# Patient Record
Sex: Female | Born: 1935 | Race: White | Hispanic: No | Smoking: Never smoker
Health system: Southern US, Community
[De-identification: ages and names within clinical notes are randomized; demographics above are authoritative.]

## PROBLEM LIST (undated history)

## (undated) DIAGNOSIS — I10 Essential (primary) hypertension: Secondary | ICD-10-CM

## (undated) DIAGNOSIS — F329 Major depressive disorder, single episode, unspecified: Secondary | ICD-10-CM

## (undated) DIAGNOSIS — K922 Gastrointestinal hemorrhage, unspecified: Secondary | ICD-10-CM

## (undated) DIAGNOSIS — G20 Parkinson's disease: Secondary | ICD-10-CM

## (undated) DIAGNOSIS — K219 Gastro-esophageal reflux disease without esophagitis: Secondary | ICD-10-CM

## (undated) DIAGNOSIS — I2699 Other pulmonary embolism without acute cor pulmonale: Secondary | ICD-10-CM

## (undated) DIAGNOSIS — F32A Depression, unspecified: Secondary | ICD-10-CM

## (undated) DIAGNOSIS — M1990 Unspecified osteoarthritis, unspecified site: Secondary | ICD-10-CM

## (undated) DIAGNOSIS — S8263XA Displaced fracture of lateral malleolus of unspecified fibula, initial encounter for closed fracture: Secondary | ICD-10-CM

## (undated) DIAGNOSIS — D649 Anemia, unspecified: Secondary | ICD-10-CM

## (undated) DIAGNOSIS — G20A1 Parkinson's disease without dyskinesia, without mention of fluctuations: Secondary | ICD-10-CM

## (undated) HISTORY — PX: VENA CAVA FILTER PLACEMENT: SUR1032

## (undated) HISTORY — PX: HERNIA REPAIR: SHX51

---

## 2001-08-30 ENCOUNTER — Ambulatory Visit (HOSPITAL_COMMUNITY): Admission: RE | Admit: 2001-08-30 | Discharge: 2001-08-30 | Payer: Self-pay | Admitting: Internal Medicine

## 2001-08-30 ENCOUNTER — Encounter: Payer: Self-pay | Admitting: Internal Medicine

## 2001-11-24 ENCOUNTER — Ambulatory Visit (HOSPITAL_COMMUNITY): Admission: RE | Admit: 2001-11-24 | Discharge: 2001-11-24 | Payer: Self-pay | Admitting: Internal Medicine

## 2001-11-24 ENCOUNTER — Encounter: Payer: Self-pay | Admitting: Internal Medicine

## 2002-02-03 ENCOUNTER — Encounter: Payer: Self-pay | Admitting: Emergency Medicine

## 2002-02-03 ENCOUNTER — Emergency Department (HOSPITAL_COMMUNITY): Admission: EM | Admit: 2002-02-03 | Discharge: 2002-02-03 | Payer: Self-pay | Admitting: Emergency Medicine

## 2002-09-12 ENCOUNTER — Encounter: Payer: Self-pay | Admitting: Internal Medicine

## 2002-09-12 ENCOUNTER — Ambulatory Visit (HOSPITAL_COMMUNITY): Admission: RE | Admit: 2002-09-12 | Discharge: 2002-09-12 | Payer: Self-pay | Admitting: Internal Medicine

## 2002-11-29 ENCOUNTER — Ambulatory Visit (HOSPITAL_COMMUNITY): Admission: RE | Admit: 2002-11-29 | Discharge: 2002-11-29 | Payer: Self-pay | Admitting: Internal Medicine

## 2003-09-20 ENCOUNTER — Inpatient Hospital Stay (HOSPITAL_COMMUNITY): Admission: EM | Admit: 2003-09-20 | Discharge: 2003-09-23 | Payer: Self-pay | Admitting: Emergency Medicine

## 2003-10-22 ENCOUNTER — Ambulatory Visit (HOSPITAL_COMMUNITY): Admission: RE | Admit: 2003-10-22 | Discharge: 2003-10-22 | Payer: Self-pay | Admitting: Internal Medicine

## 2003-11-21 ENCOUNTER — Other Ambulatory Visit: Admission: RE | Admit: 2003-11-21 | Discharge: 2003-11-21 | Payer: Self-pay | Admitting: Dermatology

## 2004-06-23 ENCOUNTER — Ambulatory Visit (HOSPITAL_COMMUNITY): Admission: RE | Admit: 2004-06-23 | Discharge: 2004-06-23 | Payer: Self-pay | Admitting: Internal Medicine

## 2004-06-30 ENCOUNTER — Ambulatory Visit (HOSPITAL_COMMUNITY): Admission: RE | Admit: 2004-06-30 | Discharge: 2004-06-30 | Payer: Self-pay | Admitting: Internal Medicine

## 2004-10-28 ENCOUNTER — Ambulatory Visit (HOSPITAL_COMMUNITY): Admission: RE | Admit: 2004-10-28 | Discharge: 2004-10-28 | Payer: Self-pay | Admitting: Internal Medicine

## 2005-11-10 ENCOUNTER — Ambulatory Visit (HOSPITAL_COMMUNITY): Admission: RE | Admit: 2005-11-10 | Discharge: 2005-11-10 | Payer: Self-pay | Admitting: Internal Medicine

## 2006-11-16 ENCOUNTER — Ambulatory Visit (HOSPITAL_COMMUNITY): Admission: RE | Admit: 2006-11-16 | Discharge: 2006-11-16 | Payer: Self-pay | Admitting: Internal Medicine

## 2009-04-22 ENCOUNTER — Ambulatory Visit (HOSPITAL_COMMUNITY): Admission: RE | Admit: 2009-04-22 | Discharge: 2009-04-22 | Payer: Self-pay | Admitting: Neurology

## 2010-10-14 ENCOUNTER — Emergency Department (HOSPITAL_COMMUNITY)
Admission: EM | Admit: 2010-10-14 | Discharge: 2010-10-14 | Payer: Self-pay | Source: Home / Self Care | Admitting: Emergency Medicine

## 2010-11-12 ENCOUNTER — Emergency Department (HOSPITAL_COMMUNITY)
Admission: EM | Admit: 2010-11-12 | Discharge: 2010-11-12 | Payer: Self-pay | Source: Home / Self Care | Admitting: Emergency Medicine

## 2010-11-13 ENCOUNTER — Observation Stay (HOSPITAL_COMMUNITY)
Admission: EM | Admit: 2010-11-13 | Discharge: 2010-11-18 | Payer: Self-pay | Source: Home / Self Care | Attending: Internal Medicine | Admitting: Internal Medicine

## 2010-11-17 LAB — DIFFERENTIAL
Basophils Absolute: 0 10*3/uL (ref 0.0–0.1)
Basophils Relative: 1 % (ref 0–1)
Eosinophils Absolute: 0.1 10*3/uL (ref 0.0–0.7)
Eosinophils Relative: 1 % (ref 0–5)
Lymphocytes Relative: 31 % (ref 12–46)
Lymphs Abs: 1.7 10*3/uL (ref 0.7–4.0)
Monocytes Absolute: 0.8 10*3/uL (ref 0.1–1.0)
Monocytes Relative: 15 % — ABNORMAL HIGH (ref 3–12)
Neutro Abs: 2.8 10*3/uL (ref 1.7–7.7)
Neutrophils Relative %: 52 % (ref 43–77)

## 2010-11-17 LAB — COMPREHENSIVE METABOLIC PANEL
ALT: <8 U/L (ref 0–35)
AST: 15 U/L (ref 0–37)
Albumin: 3.5 g/dL (ref 3.5–5.2)
Alkaline Phosphatase: 53 U/L (ref 39–117)
BUN: 22 mg/dL (ref 6–23)
CO2: 28 mEq/L (ref 19–32)
Calcium: 9.6 mg/dL (ref 8.4–10.5)
Chloride: 96 mEq/L (ref 96–112)
Creatinine, Ser: 1.36 mg/dL — ABNORMAL HIGH (ref 0.4–1.2)
GFR calc Af Amer: 45 mL/min — ABNORMAL LOW (ref >60)
GFR calc non Af Amer: 37 mL/min — ABNORMAL LOW (ref >60)
Glucose, Bld: 92 mg/dL (ref 70–99)
Potassium: 4.5 mEq/L (ref 3.5–5.1)
Sodium: 134 mEq/L — ABNORMAL LOW (ref 135–145)
Total Bilirubin: 0.6 mg/dL (ref 0.3–1.2)
Total Protein: 6.5 g/dL (ref 6.0–8.3)

## 2010-11-17 LAB — CBC
HCT: 32.9 % — ABNORMAL LOW (ref 36.0–46.0)
Hemoglobin: 11.6 g/dL — ABNORMAL LOW (ref 12.0–15.0)
MCH: 33 pg (ref 26.0–34.0)
MCHC: 35.3 g/dL (ref 30.0–36.0)
MCV: 93.5 fL (ref 78.0–100.0)
Platelets: 290 10*3/uL (ref 150–400)
RBC: 3.52 MIL/uL — ABNORMAL LOW (ref 3.87–5.11)
RDW: 12.8 % (ref 11.5–15.5)
WBC: 5.3 10*3/uL (ref 4.0–10.5)

## 2010-11-17 NOTE — H&P (Signed)
[name], [name]                 ACCOUNT NO.:  [id number]  MEDICAL RECORD NO.:  [id number]          PATIENT TYPE:  INP  LOCATION:  A341                          FACILITY:  [facility]  PHYSICIAN:  Kingsley Callander. [name], MD       DATE OF BIRTH:  1923-01-08  DATE OF ADMISSION:  11/13/2010 DATE OF DISCHARGE:  LH                             HISTORY & PHYSICAL   CHIEF COMPLAINT:  Right hip pain.  HISTORY OF PRESENT ILLNESS:  This patient is an 77 year old white female who presented to the emergency room with right hip pain.  Her pain has increased in severity over the last 3 days.  She denies any fall or injury.  She was in the emergency room on the night prior to admission, at which time x-rays revealed no fracture of the hip or pelvis.  After returning home, she was not able to walk.  The family could not manage in her current state.  She was reevaluated at the emergency room where an MRI of the hip was obtained.  This also revealed no fracture. However, there was evidence of lumbar spondylosis.  She had described burning pain in her right leg.  She had not experienced any unilateral weakness nor had there had been any real change in her bowel or bladder control which are not optimal at baseline.  She was hospitalized for additional evaluation and treatment of her intractable back pain and now inability to walk.  PAST MEDICAL HISTORY: 1. Parkinson disease. 2. Osteoarthritis. 3. Hypertension. 4. Diabetes. 5. Anxiety. 6. GERD. 7. Osteoporosis. 8. Hyperlipidemia. 9. Hypercalcemia. 10.Hysterectomy. 11.Foot surgery. 12.Breast lumpectomy. 13.Cataract surgery.  MEDICATIONS: 1. Paxil 10 mg daily. 2. Remeron 15 mg at bedtime. 3. Sinemet 25/100 two tablets t.i.d. 4. Lisinopril/HCT 40/25 mg daily. 5. Tylenol p.r.n. 6. Tramadol p.r.n. 7. Prilosec 20 mg daily.  ALLERGIES:  None.  SOCIAL HISTORY:  She lives at home and does not use tobacco, alcohol, or recreational substances.  She is  accompanied by her daughter, [name].  FAMILY HISTORY:  Her father had a stroke.  Two brothers have had coronary artery disease.  REVIEW OF SYSTEMS:  Noncontributory.  PHYSICAL EXAMINATION:  GENERAL:  Vital signs are currently normal.  She is uncomfortable with minimal movement. HEENT:  Eyes and nose are unremarkable.  Oropharynx reveals missing teeth.  Mucous membranes are moist. NECK:  Supple with no JVD or thyromegaly. LUNGS:  Clear. HEART:  Regular with no murmurs. ABDOMEN:  Nontender with no hepatosplenomegaly. EXTREMITIES:  No cyanosis, clubbing, or edema.  She has fairly well- maintained range of motion in the hips with no palpable hip tenderness. NEUROLOGIC:  Straight leg raising is positive on the right.  There is no focal weakness.  She has diminished light touch sensation in a right lateral leg.  She has stable rigidity.  Cogwheeling is noted.  Her gait was not able to be tested.  No focal weakness.  Back reveals kyphosis and scoliosis.  LABORATORY DATA:  Reveals an unremarkable CBC and a mildly elevated creatinine of 1.3.  X-rays revealed no evidence of hip fracture.  IMPRESSION/PLAN: 1. Right  hip pain.  This appears to likely be related to lumbar     spondylosis and referred pain.  She will be evaluated with an MRI     of the lumbar spine based on the appearance of lumbar spondylosis     on the MRI of her hip.  She will be treated with oxycodone as     needed.  Physical therapy will be consulted as needed. 2. Parkinson disease.  Continue Sinemet. 3. Osteoarthritis. 4. Hypertension.  Continue lisinopril/HCT. 5. History of diabetes, now controlled with diet. 6. Anxiety and depression.  Continue Paxil, Remeron and p.r.n. Xanax. 7. Gastroesophageal reflux disease.  Continue Prilosec. 8. Renal insufficiency, stable. 9. History of osteoporosis. 10.History of hyperlipidemia.     Kingsley Callander. [name], MD     ROF/MEDQ  D:  11/14/2010  T:  11/15/2010  Job:   [id number]  Electronically Signed by Carylon Perches MD on 11/17/2010 08:10:22 AM

## 2010-11-21 ENCOUNTER — Inpatient Hospital Stay (HOSPITAL_COMMUNITY)
Admission: EM | Admit: 2010-11-21 | Discharge: 2010-11-24 | Disposition: A | Discharge status: Home / Self Care | Payer: Self-pay | Source: Home / Self Care | Attending: Internal Medicine | Admitting: Internal Medicine

## 2010-11-21 LAB — BASIC METABOLIC PANEL
BUN: 40 mg/dL — ABNORMAL HIGH (ref 6–23)
CO2: 27 mEq/L (ref 19–32)
Calcium: 9.9 mg/dL (ref 8.4–10.5)
Chloride: 93 mEq/L — ABNORMAL LOW (ref 96–112)
Creatinine, Ser: 1.4 mg/dL — ABNORMAL HIGH (ref 0.4–1.2)
GFR calc Af Amer: 43 mL/min — ABNORMAL LOW (ref >60)
GFR calc non Af Amer: 36 mL/min — ABNORMAL LOW (ref >60)
Glucose, Bld: 163 mg/dL — ABNORMAL HIGH (ref 70–99)
Potassium: 4.4 mEq/L (ref 3.5–5.1)
Sodium: 131 mEq/L — ABNORMAL LOW (ref 135–145)

## 2010-11-21 LAB — DIFFERENTIAL
Basophils Absolute: 0 10*3/uL (ref 0.0–0.1)
Basophils Relative: 0 % (ref 0–1)
Eosinophils Absolute: 0 10*3/uL (ref 0.0–0.7)
Eosinophils Relative: 0 % (ref 0–5)
Lymphocytes Relative: 3 % — ABNORMAL LOW (ref 12–46)
Lymphs Abs: 0.4 10*3/uL — ABNORMAL LOW (ref 0.7–4.0)
Monocytes Absolute: 0.8 10*3/uL (ref 0.1–1.0)
Monocytes Relative: 7 % (ref 3–12)
Neutro Abs: 9.4 10*3/uL — ABNORMAL HIGH (ref 1.7–7.7)
Neutrophils Relative %: 89 % — ABNORMAL HIGH (ref 43–77)

## 2010-11-21 LAB — CBC
HCT: 36.4 % (ref 36.0–46.0)
Hemoglobin: 13.3 g/dL (ref 12.0–15.0)
MCH: 33.3 pg (ref 26.0–34.0)
MCHC: 36.5 g/dL — ABNORMAL HIGH (ref 30.0–36.0)
MCV: 91.2 fL (ref 78.0–100.0)
Platelets: 261 10*3/uL (ref 150–400)
RBC: 3.99 MIL/uL (ref 3.87–5.11)
RDW: 12.5 % (ref 11.5–15.5)
WBC: 10.6 10*3/uL — ABNORMAL HIGH (ref 4.0–10.5)

## 2010-11-21 NOTE — Progress Notes (Signed)
  NAMECEIRA, HOESCHEN                 ACCOUNT NO.:  [id number]  MEDICAL RECORD NO.:  [id number]          PATIENT TYPE:  OBV  LOCATION:  A341                          FACILITY:  [facility]  PHYSICIAN:  Edward L. Juanetta Gosling, M.D.DATE OF BIRTH:  10-30-1922  DATE OF PROCEDURE: DATE OF DISCHARGE:  11/18/2010                                PROGRESS NOTE   The patient of Dr. [name].  Ms. Landgrebe is doing okay and has a bed offer now at one of the skilled care facilities in town.  She has no new complaints.  Her exam shows temperature is 97.4, pulse 86, respirations 18, blood pressure 119/72, O2 sats 97% on room air.  Dr. [name] has already done the discharge summary and was done last night.  I do not think there are any changes to that.  Assessment then is she has a right lumbar radiculopathy, Parkinson disease, osteoarthritis, hypertension, diabetes, gastroesophageal reflux disease, osteoporosis and she is going to be discharged to the nursing home.    Edward L. Juanetta Gosling, M.D.     ELH/MEDQ  D:  11/18/2010  T:  11/19/2010  Job:  [id number]  Electronically Signed by Kari Baars M.D. on 11/21/2010 10:52:52 AM

## 2010-11-22 LAB — URINALYSIS, ROUTINE W REFLEX MICROSCOPIC
Bilirubin Urine: NEGATIVE
Hgb urine dipstick: NEGATIVE
Nitrite: NEGATIVE
Protein, ur: NEGATIVE mg/dL
Specific Gravity, Urine: 1.02 (ref 1.005–1.030)
Urine Glucose, Fasting: NEGATIVE mg/dL
Urobilinogen, UA: 0.2 mg/dL (ref 0.0–1.0)
pH: 5 (ref 5.0–8.0)

## 2010-11-22 LAB — MRSA PCR SCREENING: MRSA by PCR: NEGATIVE

## 2010-11-23 LAB — BASIC METABOLIC PANEL
BUN: 22 mg/dL (ref 6–23)
CO2: 27 mEq/L (ref 19–32)
Calcium: 8.8 mg/dL (ref 8.4–10.5)
Chloride: 101 mEq/L (ref 96–112)
Creatinine, Ser: 1.08 mg/dL (ref 0.4–1.2)
GFR calc Af Amer: 58 mL/min — ABNORMAL LOW (ref >60)
GFR calc non Af Amer: 48 mL/min — ABNORMAL LOW (ref >60)
Glucose, Bld: 95 mg/dL (ref 70–99)
Potassium: 4.8 mEq/L (ref 3.5–5.1)
Sodium: 133 mEq/L — ABNORMAL LOW (ref 135–145)

## 2010-11-23 LAB — CBC
HCT: 32.5 % — ABNORMAL LOW (ref 36.0–46.0)
Hemoglobin: 11.4 g/dL — ABNORMAL LOW (ref 12.0–15.0)
MCH: 33 pg (ref 26.0–34.0)
MCHC: 35.1 g/dL (ref 30.0–36.0)
MCV: 94.2 fL (ref 78.0–100.0)
Platelets: 240 10*3/uL (ref 150–400)
RBC: 3.45 MIL/uL — ABNORMAL LOW (ref 3.87–5.11)
RDW: 13 % (ref 11.5–15.5)
WBC: 7.5 10*3/uL (ref 4.0–10.5)

## 2010-11-23 LAB — DIFFERENTIAL
Basophils Absolute: 0 10*3/uL (ref 0.0–0.1)
Basophils Relative: 0 % (ref 0–1)
Eosinophils Absolute: 0 10*3/uL (ref 0.0–0.7)
Eosinophils Relative: 0 % (ref 0–5)
Lymphocytes Relative: 16 % (ref 12–46)
Lymphs Abs: 1.2 10*3/uL (ref 0.7–4.0)
Monocytes Absolute: 0.9 10*3/uL (ref 0.1–1.0)
Monocytes Relative: 12 % (ref 3–12)
Neutro Abs: 5.4 10*3/uL (ref 1.7–7.7)
Neutrophils Relative %: 72 % (ref 43–77)

## 2010-11-24 NOTE — Discharge Summary (Signed)
  [name], [name]                 ACCOUNT NO.:  [id number]  MEDICAL RECORD NO.:  [id number]          PATIENT TYPE:  OBV  LOCATION:  A341                          FACILITY:  [facility]  PHYSICIAN:  Kingsley Callander. [name], MD       DATE OF BIRTH:  1923/02/14  DATE OF ADMISSION:  11/13/2010 DATE OF DISCHARGE:  LH                              DISCHARGE SUMMARY   DISCHARGE DIAGNOSES: 1. Right lumbar radiculopathy. 2. Parkinson's disease. 3. Osteoarthritis. 4. Hypertension. 5. History of diabetes. 6. Anxiety. 7. Gastroesophageal reflux disease. 8. Osteoporosis. 9. Hyperlipidemia. 10.History of hypercalcemia.  DISCHARGE MEDICATIONS: 1. Prednisone 20 mg daily for 2 weeks, 10 mg daily for 2 weeks, then 5     mg daily for 2 weeks. 2. Percocet 5/325 mg q.4 p.r.n. 3. Paxil 10 mg daily. 4. Remeron 15 mg at bedtime. 5. Sinemet 25/100, 2 tablets t.i.d. 6. Lisinopril 40 mg daily. 7. HCTZ 25 mg daily. 8. Tylenol 650 mg p.o. q.4 p.r.n. 9. Prilosec 20 mg daily.  HOSPITAL COURSE:  This patient is an 77 year old female with Parkinson's disease who presented with severe pain in her right hip.  She had been in the emergency room 1 day prior to admission with right hip pain.  X- rays at that time revealed no fracture.  She came back to the emergency room at which time an MRI was obtained which again revealed no fracture. She was hospitalized for pain control and further evaluation.  She underwent an MRI of the lumbar spine which revealed severe facet arthritis at L5-S1 on the right along with a synovial cyst compressing the right side of the thecal sac deviating the right S1 nerve root..  Her case was discussed with her neurologist, Dr. Terrace Arabia and with the neurosurgeon on call Dr. Phoebe Perch.  The decision was made to treat conservatively with addition of corticosteroids.  Arrangements will be made for an outpatient neurosurgery evaluation for consideration of epidural steroids.  She will require physical  therapy and probably a skilled nursing facility placement for now.  Her family is really not able to care for her since she cannot stand and walk independently at this point.  She has been stable from a Parkinson standpoint.  She has been continued on Sinemet.  Her hypertension has been well controlled on lisinopril and HCTZ.  She does have renal insufficiency with a BUN and creatinine of 22 and 1.36. Her electrolytes were normal.  Her condition at discharge is improved.     Kingsley Callander. [name], MD     ROF/MEDQ  D:  11/17/2010  T:  11/17/2010  Job:  [id number]  Electronically Signed by Carylon Perches MD on 11/24/2010 07:48:54 AM

## 2010-11-27 NOTE — H&P (Signed)
[name], [name]                 ACCOUNT NO.:  [id number]  MEDICAL RECORD NO.:  [id number]           PATIENT TYPE:  LOCATION:                                 FACILITY:  PHYSICIAN:  Keath Matera L. Juanetta Gosling, M.D.DATE OF BIRTH:  1923-04-23  DATE OF ADMISSION: DATE OF DISCHARGE:  LH                             HISTORY & PHYSICAL   The patient of Dr. [name].  HISTORY OF PRESENT ILLNESS:  [name] is an 77 year old who was discharged from the hospital earlier this week.  She has a history of a right lumbar radiculopathy, Parkinson's disease, osteoarthritis, hypertension, diabetes, anxiety, GERD, osteoporosis, hyperlipidemia, and hypercalcemia, and she was transferred to a skilled care facility. After she had been at the skilled care facility.  However, she developed GI problems, and she has been vomiting up small amounts of clear liquid for the last 12 hours or so.  She received Phenergan in the nursing home but it did not help.  She has been nauseated but has not had any diarrhea.  She has not had any fever.  She has had some cough.  It is not clear that this was related to anything except perhaps to meals. She was sent to the emergency room, and it was felt that she should be admitted to try to get control of her symptoms, and she was not controlled on medications at the nursing home.  PAST MEDICAL HISTORY:  As mentioned is positive for Parkinson disease, osteoarthritis, hypertension, diabetes, anxiety, GERD, osteoporosis, hyperlipidemia, hypercalcemia, status post hysterectomy, status post foot surgery, status post a lump removal of her breast and cataract surgery.  MEDICATIONS:  Prednisone 20 mg daily for 2 weeks then 10 mg daily for 2 weeks, then 5 mg daily for 2 weeks, Percocet 5/325 every 4 hours p.r.n. pain, Paxil 10 mg daily, Remeron 50 mg at bedtime, Sinemet 25/100 two tablets t.i.d., lisinopril 40 mg daily, hydrochlorothiazide 25 mg daily, Tylenol 650 mg p.o. q.4 h p.r.n.  and Prilosec 20 mg daily.  SOCIAL HISTORY:  She lived at home until the admission to the skilled care facility which is for rehabilitation.  She does not use tobacco, alcohol, or illicit drugs.  FAMILY HISTORY:  It shows that her father died of a stroke.  She does have 2 brothers who had heart disease.  It was cause of their death.  REVIEW OF SYSTEMS:  As mentioned, she has not had any nausea.  She has not had any diarrhea.  She has had a little bit of abdominal cramping.  PHYSICAL EXAMINATION:  GENERAL:  A well-developed, well-nourished elderly female who appears to be in mild distress because of nausea. VITAL SIGNS:  Temperature is 97.7, pulse 117, respirations 18, blood pressure 116/71, and O2 sat 91% on room air.  Her pulse later came down to 98, height 59 inches, weight 61.3 kg. HEENT:  Her pupils are reactive.  Nose and throat are clear.  Her mucous membranes are somewhat dry.  Tympanic membranes are intact. NECK:  Supple without masses, bruits, or JVD. CHEST:  Clear without wheezes, rales, or rhonchi. HEART:  Regular.  She initially  had a tachycardia which is better with IV fluid replacement.  She does not have a murmur, gallop, or rub. ABDOMEN:  Soft.  No masses are felt.  Bowel sounds present and active perhaps slightly hyperactive.  She has very minimal diffuse tenderness of the abdomen. EXTREMITIES:  No clubbing, cyanosis, or edema. CENTRAL NERVOUS SYSTEM:  Evidence of Parkinson disease, otherwise intact.  LABORATORY WORK:  Her white blood count is 10,600, hemoglobin 13.3, platelets 261, BMET shows a sodium that is slightly low at 131, glucose of 163, BUN of 40, creatinine 1.4, both are slightly higher than previously.  Her urinalysis is negative, and she had abdominal acute abdominal series that shows a large hiatal hernia but no small bowel obstruction.  No abnormalities on chest x-rays except for hyperexpansion.  ASSESSMENT:  She has possibly a gastroenteritis.   She has some sort of nausea and vomiting.  She has not developed any fever, however, and she has not had any diarrhea.  She is at a skilled care facility, so there are some exposures.  She has multiple other medical problems as listed above.  My plan then is to continue with her medications except for her hydrochlorothiazide, start her on IV fluids, anti-emetics.  Hopefully, this will be a short-lived self-limited problem, and she will be able to return to her skilled care facility early next week.     Fabio Wah L. Juanetta Gosling, M.D.     ELH/MEDQ  D:  11/22/2010  T:  11/23/2010  Job:  [id number]  Electronically Signed by Kari Baars M.D. on 11/27/2010 08:20:53 AM

## 2010-12-01 NOTE — Discharge Summary (Signed)
  Amy, [name]                 ACCOUNT NO.:  [id number]  MEDICAL RECORD NO.:  [id number]          PATIENT TYPE:  INP  LOCATION:  A307                          FACILITY:  [facility]  PHYSICIAN:  Kingsley Callander. [name], MD       DATE OF BIRTH:  [birth year]  DATE OF ADMISSION:  11/21/2010 DATE OF DISCHARGE:  LH                              DISCHARGE SUMMARY   DISCHARGE DIAGNOSES: 1. Gastroenteritis. 2. Dehydration. 3. Right lumbar radiculopathy. 4. Parkinson's disease. 5. Osteoarthritis. 6. Hypertension. 7. History of diabetes. 8. Anxiety. 9. Gastroesophageal reflux disease. 10.Osteoporosis. 11.Hyperlipidemia. 12.History of hypercalcemia.  DISCHARGE MEDICATIONS: 1. Prednisone 20 mg daily for 1 more week, then 10 mg daily for 2     weeks, then 5 mg daily for 2 weeks. 2. Percocet 5/325 q.4 h. p.r.n. 3. Paxil 10 mg daily. 4. Remeron 15 mg at bedtime. 5. Sinemet 25/100, 2 tablets t.i.d. 6. Lisinopril 40 mg daily. 7. HCTZ 25 mg daily. 8. Tylenol 650 mg p.o. q.4 p.r.n. 9. Prilosec 20 mg daily.  HOSPITAL COURSE:  This patient is an 77 year old white female with Parkinson's disease who was admitted to the hospital with a probable gastroenteritis.  She had been hospitalized last week and had been discharged to [facility].  She developed nausea and vomiting and was readmitted and treated with IV fluids and antiemetics.  She was dehydrated with a BUN of 40 and creatinine of 1.4.  Serum sodium was mildly low at 131.  Her white count was 10.6.  She was allowed to take clear liquids after vomiting resolved.  She tolerated this well and her diet was advanced without complications. She was significantly improved by the 31st and was stable for discharge back to a skilled nursing facility.  She continues to experience right back and hip pain felt related to lumbar radiculopathy. Arrangements are being made for consultation with the physiatrist. She will be continued on a prednisone taper along with  oxycodone on a p.r.n. basis.  She will be seen in followup at her nursing facility.     Kingsley Callander. [name], MD     ROF/MEDQ  D:  11/24/2010  T:  11/24/2010  Job:  [id number]  Electronically Signed by Carylon Perches MD on 12/01/2010 07:53:56 AM

## 2011-01-04 LAB — URINALYSIS, ROUTINE W REFLEX MICROSCOPIC
Bilirubin Urine: NEGATIVE
Glucose, UA: NEGATIVE mg/dL
Hgb urine dipstick: NEGATIVE
Ketones, ur: NEGATIVE mg/dL
Nitrite: NEGATIVE
Protein, ur: NEGATIVE mg/dL
Specific Gravity, Urine: 1.005 (ref 1.005–1.030)
Urobilinogen, UA: 0.2 mg/dL (ref 0.0–1.0)
pH: 6 (ref 5.0–8.0)

## 2011-01-04 LAB — URINE CULTURE
Colony Count: NO GROWTH
Culture  Setup Time: 201112211126
Culture: NO GROWTH

## 2011-01-04 LAB — CBC
HCT: 33.2 % — ABNORMAL LOW (ref 36.0–46.0)
Hemoglobin: 11.8 g/dL — ABNORMAL LOW (ref 12.0–15.0)
MCH: 32.6 pg (ref 26.0–34.0)
MCHC: 35.5 g/dL (ref 30.0–36.0)
MCV: 91.7 fL (ref 78.0–100.0)
Platelets: 249 K/uL (ref 150–400)
RBC: 3.62 MIL/uL — ABNORMAL LOW (ref 3.87–5.11)
RDW: 12.6 % (ref 11.5–15.5)
WBC: 6.2 K/uL (ref 4.0–10.5)

## 2011-01-04 LAB — DIFFERENTIAL
Basophils Absolute: 0 10*3/uL (ref 0.0–0.1)
Basophils Relative: 1 % (ref 0–1)
Eosinophils Absolute: 0.1 10*3/uL (ref 0.0–0.7)
Eosinophils Relative: 2 % (ref 0–5)
Lymphocytes Relative: 17 % (ref 12–46)

## 2011-01-05 ENCOUNTER — Ambulatory Visit: Payer: Self-pay | Admitting: Gastroenterology

## 2011-01-07 ENCOUNTER — Encounter: Payer: Self-pay | Admitting: Gastroenterology

## 2011-01-07 ENCOUNTER — Ambulatory Visit (INDEPENDENT_AMBULATORY_CARE_PROVIDER_SITE_OTHER): Payer: Self-pay | Admitting: Gastroenterology

## 2011-01-07 ENCOUNTER — Encounter: Payer: Self-pay | Admitting: Internal Medicine

## 2011-01-07 DIAGNOSIS — K219 Gastro-esophageal reflux disease without esophagitis: Secondary | ICD-10-CM

## 2011-01-07 DIAGNOSIS — R1319 Other dysphagia: Secondary | ICD-10-CM | POA: Insufficient documentation

## 2011-01-07 DIAGNOSIS — I959 Hypotension, unspecified: Secondary | ICD-10-CM

## 2011-01-12 NOTE — Letter (Signed)
Summary: EGD/ED ORDER  EGD/ED ORDER   Imported By: Ave Filter 01/07/2011 15:00:17  _____________________________________________________________________  External Attachment:    Type:   Image     Comment:   External Document

## 2011-01-13 ENCOUNTER — Emergency Department (HOSPITAL_COMMUNITY): Payer: Medicare Other

## 2011-01-13 ENCOUNTER — Inpatient Hospital Stay (HOSPITAL_COMMUNITY)
Admission: RE | Admit: 2011-01-13 | Discharge: 2011-01-18 | DRG: 682 | Disposition: A | Discharge status: Skilled Nursing Facility | Payer: Medicare Other | Source: Ambulatory Visit | Attending: Internal Medicine | Admitting: Internal Medicine

## 2011-01-13 DIAGNOSIS — M1990 Unspecified osteoarthritis, unspecified site: Secondary | ICD-10-CM | POA: Diagnosis present

## 2011-01-13 DIAGNOSIS — G20 Parkinson's disease: Secondary | ICD-10-CM | POA: Diagnosis present

## 2011-01-13 DIAGNOSIS — K219 Gastro-esophageal reflux disease without esophagitis: Secondary | ICD-10-CM | POA: Diagnosis present

## 2011-01-13 DIAGNOSIS — E860 Dehydration: Secondary | ICD-10-CM | POA: Diagnosis present

## 2011-01-13 DIAGNOSIS — G20A1 Parkinson's disease without dyskinesia, without mention of fluctuations: Secondary | ICD-10-CM | POA: Diagnosis present

## 2011-01-13 DIAGNOSIS — Z66 Do not resuscitate: Secondary | ICD-10-CM | POA: Diagnosis present

## 2011-01-13 DIAGNOSIS — B951 Streptococcus, group B, as the cause of diseases classified elsewhere: Secondary | ICD-10-CM | POA: Diagnosis present

## 2011-01-13 DIAGNOSIS — D649 Anemia, unspecified: Secondary | ICD-10-CM | POA: Diagnosis present

## 2011-01-13 DIAGNOSIS — J189 Pneumonia, unspecified organism: Secondary | ICD-10-CM | POA: Diagnosis present

## 2011-01-13 DIAGNOSIS — E119 Type 2 diabetes mellitus without complications: Secondary | ICD-10-CM | POA: Diagnosis present

## 2011-01-13 DIAGNOSIS — N390 Urinary tract infection, site not specified: Secondary | ICD-10-CM | POA: Diagnosis present

## 2011-01-13 DIAGNOSIS — I9589 Other hypotension: Secondary | ICD-10-CM | POA: Diagnosis present

## 2011-01-13 DIAGNOSIS — I10 Essential (primary) hypertension: Secondary | ICD-10-CM | POA: Diagnosis present

## 2011-01-13 DIAGNOSIS — N179 Acute kidney failure, unspecified: Principal | ICD-10-CM | POA: Diagnosis present

## 2011-01-13 DIAGNOSIS — E875 Hyperkalemia: Secondary | ICD-10-CM | POA: Diagnosis present

## 2011-01-13 LAB — URINE MICROSCOPIC-ADD ON

## 2011-01-13 LAB — CBC
Platelets: 128 10*3/uL — ABNORMAL LOW (ref 150–400)
RDW: 16.2 % — ABNORMAL HIGH (ref 11.5–15.5)
WBC: 10.5 10*3/uL (ref 4.0–10.5)

## 2011-01-13 LAB — URINALYSIS, ROUTINE W REFLEX MICROSCOPIC
Nitrite: NEGATIVE
Specific Gravity, Urine: 1.02 (ref 1.005–1.030)
Urobilinogen, UA: 0.2 mg/dL (ref 0.0–1.0)

## 2011-01-13 LAB — COMPREHENSIVE METABOLIC PANEL
ALT: <8 U/L (ref 0–35)
CO2: 16 mEq/L — ABNORMAL LOW (ref 19–32)
Calcium: 8.5 mg/dL (ref 8.4–10.5)
Chloride: 100 mEq/L (ref 96–112)
Creatinine, Ser: 3.47 mg/dL — ABNORMAL HIGH (ref 0.4–1.2)
GFR calc non Af Amer: 13 mL/min — ABNORMAL LOW (ref >60)
Glucose, Bld: 118 mg/dL — ABNORMAL HIGH (ref 70–99)
Sodium: 129 mEq/L — ABNORMAL LOW (ref 135–145)
Total Bilirubin: 0.6 mg/dL (ref 0.3–1.2)

## 2011-01-13 LAB — DIFFERENTIAL
Basophils Absolute: 0 10*3/uL (ref 0.0–0.1)
Eosinophils Absolute: 0.1 10*3/uL (ref 0.0–0.7)
Monocytes Absolute: 1.1 10*3/uL — ABNORMAL HIGH (ref 0.1–1.0)
Neutrophils Relative %: 75 % (ref 43–77)

## 2011-01-14 ENCOUNTER — Inpatient Hospital Stay (HOSPITAL_COMMUNITY): Payer: Medicare Other

## 2011-01-14 LAB — GLUCOSE, CAPILLARY
Glucose-Capillary: 162 mg/dL — ABNORMAL HIGH (ref 70–99)
Glucose-Capillary: 157 mg/dL — ABNORMAL HIGH (ref 70–99)
Glucose-Capillary: 117 mg/dL — ABNORMAL HIGH (ref 70–99)

## 2011-01-14 LAB — PROCALCITONIN: Procalcitonin: 0.65 ng/mL

## 2011-01-14 LAB — BASIC METABOLIC PANEL
BUN: 46 mg/dL — ABNORMAL HIGH (ref 6–23)
Calcium: 7.8 mg/dL — ABNORMAL LOW (ref 8.4–10.5)
Creatinine, Ser: 2.54 mg/dL — ABNORMAL HIGH (ref 0.4–1.2)
GFR calc Af Amer: 22 mL/min — ABNORMAL LOW (ref >60)
GFR calc non Af Amer: 18 mL/min — ABNORMAL LOW (ref >60)

## 2011-01-14 LAB — CBC
MCH: 31.3 pg (ref 26.0–34.0)
MCHC: 33.2 g/dL (ref 30.0–36.0)
MCV: 94.4 fL (ref 78.0–100.0)
Platelets: 126 10*3/uL — ABNORMAL LOW (ref 150–400)
RBC: 2.49 MIL/uL — ABNORMAL LOW (ref 3.87–5.11)
RDW: 16 % — ABNORMAL HIGH (ref 11.5–15.5)

## 2011-01-14 LAB — DIFFERENTIAL
Basophils Relative: 0 % (ref 0–1)
Eosinophils Absolute: 0 10*3/uL (ref 0.0–0.7)
Eosinophils Relative: 0 % (ref 0–5)
Monocytes Absolute: 0.2 10*3/uL (ref 0.1–1.0)
Monocytes Relative: 2 % — ABNORMAL LOW (ref 3–12)
Neutrophils Relative %: 89 % — ABNORMAL HIGH (ref 43–77)

## 2011-01-15 LAB — CROSSMATCH
ABO/RH(D): B POS
Unit division: 0

## 2011-01-15 LAB — BASIC METABOLIC PANEL
Calcium: 7.8 mg/dL — ABNORMAL LOW (ref 8.4–10.5)
GFR calc Af Amer: 31 mL/min — ABNORMAL LOW (ref >60)
GFR calc non Af Amer: 25 mL/min — ABNORMAL LOW (ref >60)
Glucose, Bld: 117 mg/dL — ABNORMAL HIGH (ref 70–99)
Sodium: 139 mEq/L (ref 135–145)

## 2011-01-15 LAB — DIFFERENTIAL
Basophils Absolute: 0 10*3/uL (ref 0.0–0.1)
Basophils Relative: 0 % (ref 0–1)
Eosinophils Absolute: 0 10*3/uL (ref 0.0–0.7)
Eosinophils Relative: 0 % (ref 0–5)
Monocytes Absolute: 0.7 10*3/uL (ref 0.1–1.0)

## 2011-01-15 LAB — URINE CULTURE
Colony Count: >=100000
Culture  Setup Time: 201203222124

## 2011-01-15 LAB — GLUCOSE, CAPILLARY

## 2011-01-15 LAB — CBC
MCHC: 33.6 g/dL (ref 30.0–36.0)
Platelets: 144 10*3/uL — ABNORMAL LOW (ref 150–400)
RDW: 15.5 % (ref 11.5–15.5)

## 2011-01-16 LAB — CBC
HCT: 35.5 % — ABNORMAL LOW (ref 36.0–46.0)
Hemoglobin: 12.2 g/dL (ref 12.0–15.0)
MCH: 31.7 pg (ref 26.0–34.0)
MCHC: 34.4 g/dL (ref 30.0–36.0)
MCV: 92.2 fL (ref 78.0–100.0)

## 2011-01-16 LAB — BASIC METABOLIC PANEL
CO2: 21 mEq/L (ref 19–32)
Chloride: 108 mEq/L (ref 96–112)
Creatinine, Ser: 1.44 mg/dL — ABNORMAL HIGH (ref 0.4–1.2)
GFR calc Af Amer: 42 mL/min — ABNORMAL LOW (ref >60)

## 2011-01-16 LAB — DIFFERENTIAL
Lymphocytes Relative: 10 % — ABNORMAL LOW (ref 12–46)
Lymphs Abs: 0.6 10*3/uL — ABNORMAL LOW (ref 0.7–4.0)
Monocytes Absolute: 0.5 10*3/uL (ref 0.1–1.0)
Monocytes Relative: 8 % (ref 3–12)
Neutro Abs: 4.9 10*3/uL (ref 1.7–7.7)

## 2011-01-17 LAB — GLUCOSE, CAPILLARY: Glucose-Capillary: 129 mg/dL — ABNORMAL HIGH (ref 70–99)

## 2011-01-17 LAB — BASIC METABOLIC PANEL
BUN: 17 mg/dL (ref 6–23)
CO2: 21 mEq/L (ref 19–32)
Chloride: 106 mEq/L (ref 96–112)
Creatinine, Ser: 1.07 mg/dL (ref 0.4–1.2)

## 2011-01-17 NOTE — Progress Notes (Signed)
  NAMEMARIBELL, [name]                 ACCOUNT NO.:  [id number]  MEDICAL RECORD NO.:  [id number]           PATIENT TYPE:  I  LOCATION:  IC10                          FACILITY:  [facility]  PHYSICIAN:  Kingsley Callander. [name], MD       DATE OF BIRTH:  08/06/1923  DATE OF PROCEDURE: DATE OF DISCHARGE:                                PROGRESS NOTE   GENERAL:  [name] is comfortable and alert today. VITAL SIGNS:  She has had no fever, temperature is 97.5, pulse 88, blood pressure 137/92, respirations 16, oxygen saturations 96% on room air. LUNGS:  Lung sounds are diminished, but clear. HEART:  Regular with no murmurs. ABDOMEN:  Soft, nontender with no hepatosplenomegaly. EXTREMITIES:  No cyanosis or edema. NEURO STATUS:  Mild tremulousness in the right hand.  IMPRESSION/PLAN: 1. Dehydration.  She is improving with IV fluids.  Her BUN and     creatinine are down to 38 and 1.88 with a potassium of 4.8.  IV     fluids were cut back and she was given 40 mg of IV Lasix last night     after she looked a little more dyspneic following her transfusions. 2. Anemia.  Her hemoglobin has improved to 11.1 after 2 units of     packed red cells.  Her white count is normal at 8.3 and platelet     count is mildly low at 144,000. 3. Urinary tract infection.  Continue antibiotic therapy. 4. Pneumonia.  Continue antibiotic therapy.  Blood cultures are     negative.  Urine culture remains pending.     Kingsley Callander. [name], MD     ROF/MEDQ  D:  01/15/2011  T:  01/15/2011  Job:  [id number]  Electronically Signed by Carylon Perches MD on 01/17/2011 08:37:29 AM

## 2011-01-17 NOTE — H&P (Signed)
NAMESUMIYA, [name]                 ACCOUNT NO.:  [id number]  MEDICAL RECORD NO.:  [id number]           PATIENT TYPE:  LOCATION:                                 FACILITY:  PHYSICIAN:  Kingsley Callander. [name], MD       DATE OF BIRTH:  11/06/1922  DATE OF ADMISSION: DATE OF DISCHARGE:  LH                             HISTORY & PHYSICAL   CHIEF COMPLAINT:  Altered mental status.  HISTORY OF PRESENT ILLNESS:  This patient is an 77 year old white female resident of [facility] who was transferred to the emergency room yesterday evening after she was witnessed by the family to be poorly responsive. The patient had been in her usual state of health reportedly earlier in the day.  She is found to have a rectal temperature of 100.  She had been diagnosed a day before with a right lower lobe pneumonia and had been started on Avelox.  The patient was brought to the emergency room and extensively evaluated.  She underwent a CT scan of the brain, which revealed no acute abnormality.  Urinalysis was consistent with a UTI. She was markedly dehydrated with a rise in BUN and creatinine.  She has been started on IV antibiotics and admitted to the intensive care unit. She experienced low blood pressures, which responded to fluid blousing. She has been on a recent tapering course of prednisone due to lumbar disk disease.  She was given a dose of IV Solu-Medrol in the emergency room.  PAST MEDICAL HISTORY:  Parkinson's disease, osteoarthritis, hypertension, diabetes, anxiety, GERD, osteoporosis, hyperlipidemia, hypocalcemia, hysterectomy, foot surgery, breast lumpectomy, cataract surgery, right lumbar radiculopathy.  MEDICATIONS: 1. Avelox 400 mg daily. 2. Omeprazole 20 mg daily. 3. Hydrochlorothiazide 25 mg daily. 4. Lisinopril 40 mg daily. 5. Paxil 10 mg daily. 6. Prednisone 5 mg daily was actually discontinued on 3/6, it appears     upon reviewing the [facility] records. 7. Tylenol 650 mg b.i.d. 8. Mucinex  1200 mg b.i.d. 9. Sinemet 25/100 two t.i.d. 10.Remeron 15 mg nightly. 11.Elavil 10 mg nightly. 12.Robitussin p.r.n.  ALLERGIES:  None.  SOCIAL HISTORY:  She is a resident of [facility].  She does not smoke, drink or use recreational substances.  FAMILY HISTORY:  Her father had a stroke.  Two brothers had coronary artery disease.  REVIEW OF SYSTEMS:  She has had cough but minimal sputum production. She has felt hot intermittently she states.  She has had some recent mild dysuria.  No abdominal pain.  PHYSICAL EXAMINATION:  VITAL SIGNS:  Temperature initially 100.0 rectally, respirations 18, pulse record is 85, but her electrocardiogram revealed an initial pulse of 137, blood pressure 96/52. GENERAL:  Pale but alert and comfortable appearing white female. HEENT:  Oropharynx appears very dry.  The sclerae are nonicteric. NECK:  Supple with no JVD or thyromegaly. LUNGS:  Breath sounds are diminished. HEART:  Regular with no murmurs at 100 beats per minute at the time of my exam.  Blood pressure is now 102/59 also now.  ABDOMEN:  Soft and nontender with no hepatosplenomegaly.  No CVA tenderness. EXTREMITIES:  No cyanosis, clubbing  or edema. SKIN:  Warm and dry. NEURO:  No focal weakness. LYMPH NODES:  No cervical or supraclavicular enlargement.  LABORATORY DATA:  Urinalysis reveals too numerous to count white cells, many bacteria, and 21-50 red cells, leukocyte esterase is positive. Sodium 129 initially with a potassium of 5.7, bicarb 16, glucose 118, BUN 56, creatinine 3.47, SGOT 9, SGPT less than 8, albumin 2.8, calcium 8.5.  White count 10.5, hemoglobin 8.5, MCV 94.9, platelets 128,000. Procalcitonin is 0.65, lactic acid 1.6.  On the morning of the 22nd, her white count is 7.7 with a hemoglobin of 7.8 and platelet count of 126,000.  Also on the morning of the 22nd her potassium is 5.2 with a sodium of 142, bicarb 18, glucose 142, BUN 46, creatinine 2.54, calcium 7.8.  Her head CT  reveals no acute intracranial abnormality.  She has fluid within maxillary sinuses bilaterally with mucosal thickening in the ethmoid air cells and fluid in the sphenoid sinus.  IMPRESSION/PLAN: 1. Dehydration.  She is improving with IV fluids.  Lisinopril and     hydrochlorothiazide are being stopped, hypotension as responded to     fluids. 2. Pneumonia.  Continue Rocephin. 3. Urinary tract infection.  Blood and urine cultures have been     obtained and are pending. 4. Anemia.  Her hemoglobin is trended down to 7.8.  She will be typed     and crossed and transfused with 2 units of packed red cells. 5.     Hyperkalemia, improved to 5.2.  Continue IV fluids and     discontinuation of her ACE inhibitor. 5. Diabetes.  Glucoses will be monitored and treated with sliding     scale insulin if needed. 6. Parkinson's disease.  Continue Sinemet. 7. Recent right lumbar radiculopathy. 8. Osteoarthritis. 9. Hypertension. 10.Gastroesophageal reflux disease.  Continue PPI therapy.    Kingsley Callander. [name], MD    ROF/MEDQ  D:  01/14/2011  T:  01/14/2011  Job:  [id number]  Electronically Signed by Carylon Perches MD on 01/17/2011 08:37:25 AM

## 2011-01-17 NOTE — Progress Notes (Signed)
  [name], [name]                 ACCOUNT NO.:  [id number]  MEDICAL RECORD NO.:  [id number]           PATIENT TYPE:  I  LOCATION:  A336                          FACILITY:  [facility]  PHYSICIAN:  Kingsley Callander. [name], MD       DATE OF BIRTH:  1923/09/21  DATE OF PROCEDURE:  01/15/2011 DATE OF DISCHARGE:                                PROGRESS NOTE   Ms. Head is alert and comfortable appearing this morning.  She has had no fever overnight.  She continues to cough and complains of a sore throat.  PHYSICAL EXAMINATION:  VITAL SIGNS:  Temperature is 97.6, pulse 93, respirations 18, blood pressure 156/88, and oxygen saturation is 99% on 2 liters. LUNGS:  Clear. HEART:  Regular with no murmurs. ABDOMEN:  Nontender with no hepatosplenomegaly. HEENT:  Oropharynx appears unremarkable.  IMPRESSION/PLAN: 1. Pneumonia.  Her white count is 6000.  Continue Avelox and Rocephin. 2. Urinary tract infection.  Her urine culture reveals greater than     100,000 of group B strep.  Blood cultures are negative. 3. Dehydration.  Her BUN and creatinine are improved to 26 and 1.44.     Her IV fluids will be decreased today.  Her Foley catheter will be     removed. 4. Parkinson disease.  She is generally weak and minimally mobile at     this point.  She will require discharge back to skilled nursing     facility at the time of discharge. 5. Thrombocytopenia.  138,000. 6. Diabetes.  Fasting glucose is 99.  Accu-Cheks will be decreased to     b.i.d.  Accu-Cheks yesterday ranged from 96-111.     Kingsley Callander. [name], MD     ROF/MEDQ  D:  01/16/2011  T:  01/16/2011  Job:  [id number]  Electronically Signed by Carylon Perches MD on 01/17/2011 08:37:34 AM

## 2011-01-18 LAB — GLUCOSE, CAPILLARY
Glucose-Capillary: 105 mg/dL — ABNORMAL HIGH (ref 70–99)
Glucose-Capillary: 112 mg/dL — ABNORMAL HIGH (ref 70–99)

## 2011-01-19 LAB — CULTURE, BLOOD (ROUTINE X 2): Culture: NO GROWTH

## 2011-01-20 ENCOUNTER — Encounter: Payer: Medicare Other | Admitting: Internal Medicine

## 2011-01-20 NOTE — Progress Notes (Signed)
  NAMEBERNICE, [name]                 ACCOUNT NO.:  [id number]  MEDICAL RECORD NO.:  [id number]           PATIENT TYPE:  I  LOCATION:  A336                          FACILITY:  [facility]  PHYSICIAN:  Kingsley Callander. [name], MD       DATE OF BIRTH:  [birth year]  DATE OF PROCEDURE: DATE OF DISCHARGE:                                PROGRESS NOTE   [name] is feeling better.  She is breathing comfortably.  Her oxygen saturation now is 96% on room air.  Foley catheter as been removed and she has been able to void.  PHYSICAL EXAMINATION:  VITAL SIGNS:  She has been hypertensive on several pressures over the past day with a high of 159/91 and a low of 116/68, pulse is 94, respirations 18, temperature 97.8.  She appears to be breathing comfortably. LUNGS:  Clear. HEART:  Regular with no murmurs. ABDOMEN:  Nontender with no hepatosplenomegaly. EXTREMITIES:  Revealed no edema or cyanosis.  IMPRESSION/PLAN: 1. Nursing home acquired pneumonia.  Continue Avelox and Rocephin. 2. Strep urinary tract infection.  Continue antibiotic therapy. 3. Acute renal failure.  This has resolved.  Her BUN and creatinine     have improved to 17 and 1.07, potassium is 3.6.  Sodium is 137. 4. Anemia due to chronic disease.  Her hemoglobin has normalized after     transfusions. 5. Diabetes well controlled on nondrug therapy with a fasting glucose     of 108. 6. Hypertension.  Readd lisinopril, however, dose will be reduced from     40 mg to 10 mg daily. 7. Parkinson's disease.  Continue Sinemet.     Kingsley Callander. [name], MD     ROF/MEDQ  D:  01/17/2011  T:  01/17/2011  Job:  [id number]  Electronically Signed by Carylon Perches MD on 01/20/2011 08:12:02 AM

## 2011-01-20 NOTE — H&P (Signed)
  [name], [name]                 ACCOUNT NO.:  [id number]  MEDICAL RECORD NO.:  [id number]           PATIENT TYPE:  I  LOCATION:  A336                          FACILITY:  [facility]  PHYSICIAN:  Kingsley Callander. [name], MD       DATE OF BIRTH:  1923-04-11  DATE OF ADMISSION:  01/13/2011 DATE OF DISCHARGE:  LH                             HISTORY & PHYSICAL   DISCHARGE DIAGNOSES: 1. Pneumonia. 2. Urinary tract infection. 3. Parkinson's disease. 4. Osteoarthritis. 5. Hypertension. 6. Diabetes. 7. Gastroesophageal reflux disease. 8. Osteoporosis. 9. Lumbar radiculopathy. 10.Anemia. 11.Dehydration.  DISCHARGE MEDICATIONS: 1. Avelox 400 mg daily for 5 more days. 2. Amoxil 500 mg t.i.d. for 5 days. 3. Acetaminophen 650 mg q.6 h p.r.n. 4. Remeron 15 mg nightly. 5. Sinemet 25/ 100 two t.i.d. 6. Paxil 10 mg daily. 7. Lisinopril 40 mg daily. 8. Prilosec 20 mg daily. 9. Percocet and hydrochlorothiazide and amitriptyline are being  stopped from her previous medication list.  HOSPITAL COURSE:  This patient is an 77 year old female resident of [facility] who presented to the emergency room with diminished responsiveness and low grade fever.  She had recently been started on antibiotic therapy for pneumonia.  She is also found to have a urinary tract infection.  Her urine culture revealed greater than 1000 colonies of group B strep.  Her blood cultures were negative.  Her white count now is 6.0.  She developed an anemia with a hemoglobin decreased to 7.8. She was transfused with 2 units of packed red cells with an improvement and her hemoglobin to 11.1 and then 12.2.  She improved.  Her fever resolved.  She was initially dehydrated and was treated with IV fluids.  Her BUN and creatinine initially were 56 and 3.4 and have improved to 17 and 1.07.  Her hypertension medications were held initially and then low-dose lisinopril was restarted without hydrochlorothiazide.  She was afebrile.  She is able  to eat.  She is oxygenating satisfactorily and was significantly improved and stable for discharge back to [facility] on the 26th.  She will be seen in follow up there.     Kingsley Callander. [name], MD     ROF/MEDQ  D:  01/18/2011  T:  01/18/2011  Job:  [id number]  Electronically Signed by Carylon Perches MD on 01/20/2011 08:12:00 AM

## 2011-01-21 ENCOUNTER — Encounter: Payer: Medicare Other | Admitting: Internal Medicine

## 2011-01-21 NOTE — Assessment & Plan Note (Signed)
Summary: DIFFICULTY SWALLOWING HER FOOD/PT SEEN SPEECH PATH/PT COMING ...   Vital Signs:  Patient profile:   77 year old female Height:      56 inches Weight:      136 pounds BMI:     30.60 Temp:     98 degrees F Pulse rate:   80 / minute BP sitting:   100 / 60  (right arm)  Vitals Entered By: Carolan Clines LPN (January 07, 2011 11:07 AM)  Visit Type:  Initial Consult Referring Provider:  [facility] Primary Care Provider:  Dr. [name]   History of Present Illness: Ms. Gallaway is a pleasant 77 year old Caucasian female who presents today in consult regarding dysphagia. She has a hx of EGD/ED in 2004 showing hiatal hernia, s/p savory dilator. Colonoscopy showed internal hemorrhoids and sigmoid diverticula. Due for repeat in 2014. States has noticed intermittent dysphagia for past 6-8 months. c/o indigestion worsening as well. Denies nausea or vomiting. No lack of appetite or wt loss. No melena or brbpr.  On original BP measurement, was 72/58. Rechecked at 100/60. At end of visit, personally checked and was 80s/60s. Daughter states it had been running low at last doctor's visit. Pt is completely asymptomatic.   Current Medications (verified): 1)  Omeprazole 20 Mg Cpdr (Omeprazole) .... Take One Once Daily 2)  Hydrochlorothiazide 25 Mg Tabs (Hydrochlorothiazide) .... Take One Once Daily 3)  Lisinopril 40 Mg Tabs (Lisinopril) .... Take One Once Daily 4)  Paroxetine Hcl 10 Mg Tabs (Paroxetine Hcl) .... Take One Once Daily 5)  Mapap 325 Mg Tabs (Acetaminophen) .... Take Two Two Times A Day 6)  Carbidopa-Levodopa 25-100 Mg Tabs (Carbidopa-Levodopa) .... Take Two Three Times A Day 7)  Mirtazapine 15 Mg Tabs (Mirtazapine) .... Take One Once Daily At Bedtime 8)  Elavil 10mg  .... Take One Once Daily 9)  O2 .... 2lpm As Needed For Dificulties of Breathing 10)  Mylanta 200-200-20 Mg/6ml Susp (Alum & Mag Hydroxide-Simeth) .... Take 80ml Q4hrs As Needed 11)  Oxycodone-Acetaminophen 5-325 Mg Tabs  (Oxycodone-Acetaminophen) .... Take One Q4hrs As Needed For Pain  Allergies (verified): 1)  ! Reglan 2)  ! * Seeds  Past History:  Past Medical History: Parkinsons GERD HTN Arthritis Depression Hypercholesterolemia EGD 2000: mild ulcer and erosion at GE junction EGD 2004: hiatal hernia, no stricture, empirical dilatation with 18mm Savory dilator  Past Surgical History: foot surgery 1977 hysterectomy 1973 breast lumpectomy benign cataract surgery  Family History: FH of DM, heart disease, CVA No FH of Colon Cancer:  Social History: Non smoker No ETOH  Review of Systems General:  Denies fever, chills, and anorexia. Eyes:  Denies blurring, irritation, and discharge. ENT:  Complains of difficulty swallowing; denies sore throat and hoarseness. CV:  Denies chest pains and syncope. Resp:  Denies dyspnea at rest and wheezing. GI:  See HPI. GU:  Denies urinary burning and urinary frequency. MS:  Denies joint pain / LOM, joint swelling, and joint stiffness. Derm:  Denies rash, itching, and dry skin. Neuro:  Denies weakness and syncope. Psych:  Denies depression and anxiety. Endo:  Denies cold intolerance and heat intolerance.  Physical Exam  General:  Well developed, well nourished, no acute distress. Head:  Normocephalic and atraumatic. Eyes:  sclera without icterus Lungs:  Clear throughout to auscultation. Heart:  Regular rate and rhythm; no murmurs, rubs,  or bruits. Abdomen:  +BS, soft, non-tender, non-distended. no appreciatable HSM. Exam limited due to inability to get on exam table. No masses noted. No rebound or  guarding.  Msk:  Symmetrical with no gross deformities. kyphosis. small hand/head tremors secondary to Parkinsons.  Extremities:  No clubbing, cyanosis, edema or deformities noted. Neurologic:  Alert and  oriented x4;  grossly normal neurologically. Skin:  Intact without significant lesions or rashes. Psych:  Alert and cooperative. Normal mood and  affect.   Impression & Recommendations:  Problem # 1:  DYSPHAGIA (ICD-52.5)  77 year old pleasant caucasian female with 6-8 mos worsening dysphagia. Last EGD/ED in 2004 with hiatal hernia and empiric dilation with 18mm savory dilator. worsening GERD, despite daily PPI therapy. No N/V or lack of appetite. diff dx include schatzki's ring, web, or stricture. Worsening GERD may be r/t known hiatal hernia and/or dietary menu at [facility]. (daughter states pt used to follow blander diet at home prior to admission to [facility] without any difficulties).   EGD/ED with Dr. Jena Gauss: the R/B/A have been discussed in detail. Pt states understanding and desires to proceed. Continue Prilosec GERD diet at [facility]  Orders: Consultation Level III (16109)  Problem # 2:  GERD (ICD-530.81)  See # 1  Orders: Consultation Level III (60454)  Problem # 3:  HYPOTENSION (ICD-458.9)  Hx of HTN, on several BP meds. Hypotensive but asmymptomatic at appt. Likely needs medication adjustment. Daughter to contact Dr. [name] office. Requested last week of BP measurements from [facility]. Daughter will be stopping by Dr. [name] office this afternoon. Will contact them as well for further management.   Orders: Consultation Level III (09811)   Orders Added: 1)  Consultation Level III [91478]

## 2011-03-10 IMAGING — CR DG ABDOMEN ACUTE W/ 1V CHEST
3 series · 3 of 3 positions shown · non-contrast
Comparison: 10/14/2010

CLINICAL DATA: Nausea, vomiting

ACUTE ABDOMEN SERIES (ABDOMEN 2 VIEW & CHEST 1 VIEW)

[view not recorded (1 of 3)]
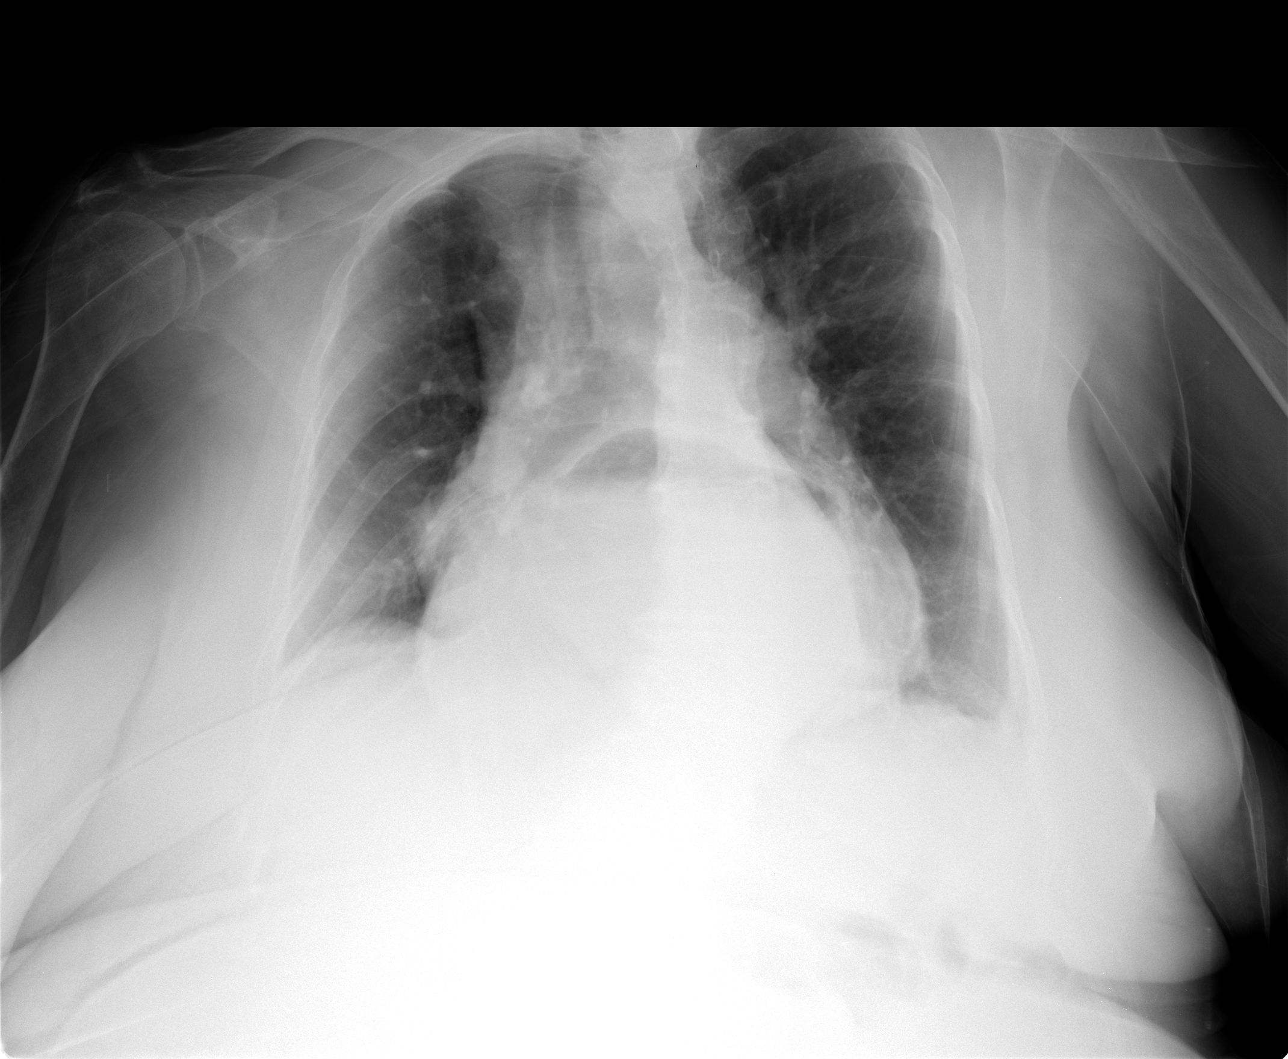

[view not recorded (2 of 3)]
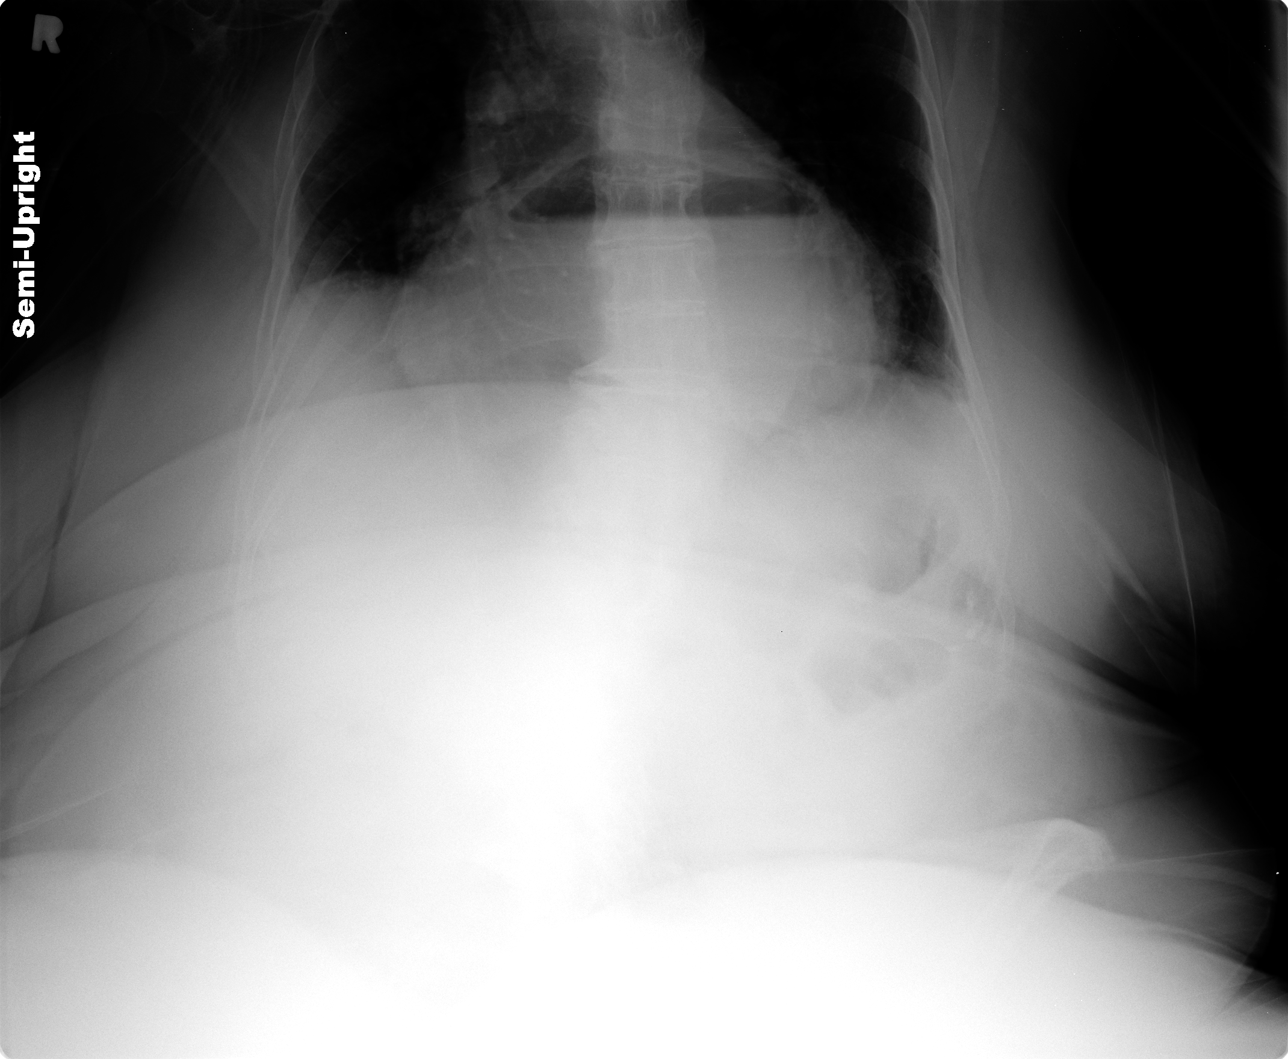

[view not recorded (3 of 3)]
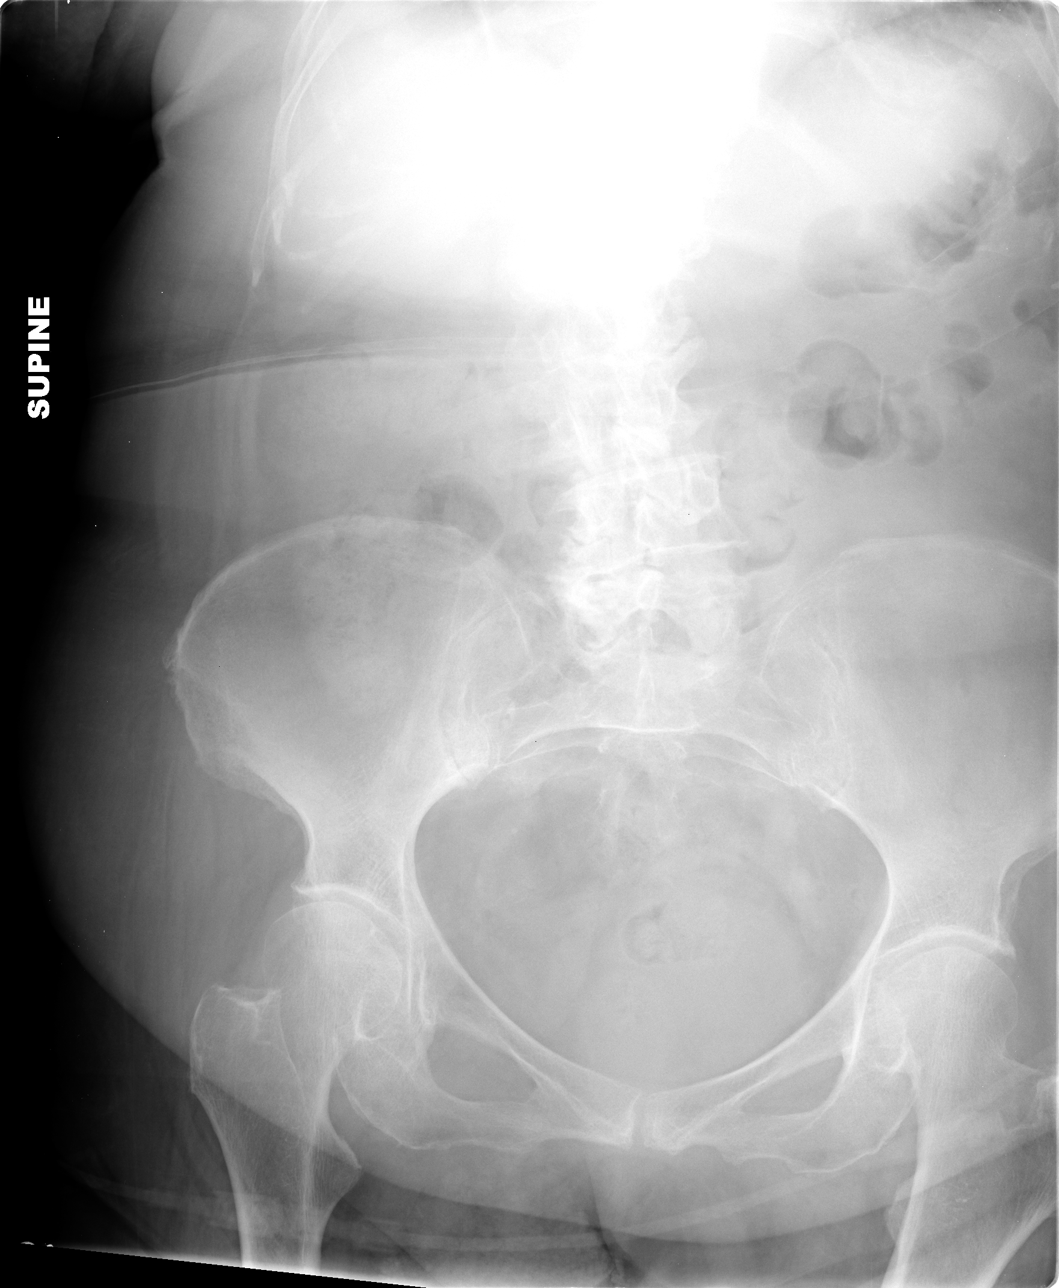

[3 of 3 positions shown; findings below may reference images not displayed]

FINDINGS: The lungs are mildly hyperexpanded but clear.  A large
hiatal hernia is again seen.  There is no intra-abdominal free air.
No dilated small bowel loops are identified and there are no air
fluid levels on the upright exam.  There is gas and stool seen in
the colon.  Scoliosis noted in the spine.
IMPRESSION: Hyperexpansion.  Large hiatal hernia.  No small bowel obstruction
by plain film.

## 2011-03-12 NOTE — H&P (Signed)
NAMEJHANIA, ETHERINGTON                           ACCOUNT NO.:  [id number]   MEDICAL RECORD NO.:  [id number]                   PATIENT TYPE:  INP   LOCATION:  A314                                 FACILITY:  [facility]   PHYSICIAN:  Kingsley Callander. [name], M.D.                  DATE OF BIRTH:  06-Sep-1923   DATE OF ADMISSION:  09/20/2003  DATE OF DISCHARGE:                                HISTORY & PHYSICAL   CHIEF COMPLAINT:  Rectal bleeding.   HISTORY OF PRESENT ILLNESS:  This patient is a 77 year old white female who  presented to the emergency room after passing a large bloody stool.  She had  experienced abdominal pain over the past week and had been treated with  Augmentin since November 23 for possible diverticulitis.  She had undergone  a colonoscopy four years earlier which had revealed no significant  abnormalities.  She describes passing a large, red glob of bloody stool.  She was not hypotensive on presentation.  Her hemoglobin was 14.5.  She had  experienced intermittent left lower quadrant pain.  She has previously had a  vaginal hysterectomy.  She does not use NSAIDS.  She does not abuse alcohol.  She has had a hiatal hernia which was inflamed at the time of an EGD last  February.  She was treated with Prilosec.   PAST MEDICAL HISTORY:  1. Hypertension.  2. Osteoporosis.  3. Incontinence.  4. Lumbar disc disease with chronic low back pain.  5. Status post hysterectomy.  6. Status post right breast lumpectomy.   MEDICATIONS:  1. Fosamax 70 mg weekly.  2. Augmentin 875 mg b.i.d.  3. Prilosec 20 mg daily.  4. Hydrochlorothiazide 25 mg daily.  5. Caltrate b.i.d.  6. Xanax 0.25 mg approximately once a week.   ALLERGIES:  ASPIRIN which caused roaring in her ears.   FAMILY HISTORY:  Her father had hypertension and died of a stroke at 53.  Her mother had hypertension but lived to 57.  She has had a sister with  hypertension, two brothers with MIs and hypertension.   REVIEW OF SYSTEMS:   No vomiting or hematemesis.  No chest pain or difficulty  breathing.   PHYSICAL EXAMINATION:  GENERAL:  Alert and oriented.  HEENT:  No scleral icterus.  Pharynx appears normal.  No JVD or thyromegaly.  LUNGS:  Clear.  HEART:  Regular with no murmurs.  ABDOMEN:  Mildly tender in the left lower quadrant.  No palpable mass or  hepatosplenomegaly.  Her stool has been heme negative initially.  EXTREMITIES:  No cyanosis, clubbing, or edema.  NEUROLOGIC:  Grossly intact.  LYMPH NODES:  No enlargement.   IMPRESSION:  1. Rectal bleeding with recent abdominal pain.  Her history is not clearly     suggestive of ischemic colitis nor diverticulitis.  A CT scan is being     obtained.  She is  not anemic or microcytic at this point.  It would     appear unlikely that she would have developed a colonic neoplasm since     her colonoscopy four years ago.  2. Hypokalemia.  Her potassium is 3.4; this will be supplemented orally.  3. Hypertension.  Will hold hydrochlorothiazide for now.  4. Osteoporosis.  Fosamax is being held.     ___________________________________________                                         Kingsley Callander. [name], M.D.   ROF/MEDQ  D:  09/23/2003  T:  09/23/2003  Job:  [id number]

## 2011-03-12 NOTE — Consult Note (Signed)
[name], [name]                           ACCOUNT NO.:  [id number]   MEDICAL RECORD NO.:  [id number]                   PATIENT TYPE:  INP   LOCATION:  A314                                 FACILITY:  [facility]   PHYSICIAN:  Tana Coast, P.A.                  DATE OF BIRTH:  December 09, 1922   DATE OF CONSULTATION:  09/20/2003  DATE OF DISCHARGE:                                   CONSULTATION   REQUESTING PHYSICIAN:  Kingsley Callander. [name], M.D.   REASON FOR CONSULTATION:  Hematochezia, left lower quadrant abdominal pain,  question left colon cancer on CT.   HISTORY OF PRESENT ILLNESS:  The patient is a 77 year old Caucasian female  who gives a 1-week history of left lower quadrant abdominal pain without  fever.  She saw Dr. [name] 3 days ago and was given Augmentin presumably for  a suspected diverticulitis.  She states that her abdominal pain did seem to  ease up the next couple of days, however, last night she felt large amount  of hematochezia (burgundy blood and dark stool).  She had several episodes  of this and eventually presented to the emergency department today.  She  denies any nausea or vomiting.  Heartburn symptoms are well controlled.  Generally she has 0-3 bowel movements daily, usual a solid stool.  In the  emergency department her white count was 5.9, hemoglobin 14.5, hematocrit  43.2, INR 1.  Sodium 138, potassium 3.4, BUN 11, creatinine 0.9, glucose  140.  Total bilirubin 0.9.  Alkaline phosphatase 60, SGOT 24, SGPT 16,  albumin 3.8.  CT of the abdomen and pelvis revealed a large hiatal hernia, a  questionable apple-core-type lesion in the descending colon which may be  stool or possibly left colon cancer.   MEDICATIONS PRIOR TO ADMISSION:  1. Caltrate 600 mg plus D 2 tablets daily.  2. Hydrochlorothiazide 25 mg daily.  3. Xanax 1 mg t.i.d. p.r.n.  4. Prilosec 1 tablet daily.  5. Fosamax 70 mg every week.  6. Augmentin was started on November 23, one b.i.d.   ALLERGIES:  She  does not take ASPIRIN because of history of ulcer.   PAST MEDICAL HISTORY:  1. Arthritis.  2. Depression  3. Hypertension.  4. Gastroesophageal reflux disease.  5. Hypercholesterolemia.  6. History of moderate size hiatal hernia with mild ulcer and erosion at EG     junction seen on EGD in December 2000.  At the time she also had a     colonoscopy which revealed a 3 mm, transverse colon polyp which was     inflammatory.  EGD in February 2004 done for GERD and dysphagia revealed     a moderate size sliding hiatal hernia with mucosal inflammation involving     the herniated part of the stomach, no esophageal stricture seen, but she     was empirically dilated with 18-mm Savory dilator.  PAST SURGICAL HISTORY:  1. Hysterectomy.  2. Right breast lumpectomy.  3. She says she has had surgery on both feet.   FAMILY HISTORY:  Mother died of old age at 44.  Father died of stroke.  No  family history of colorectal cancer.  She had a brother who had stomach  cancer.   SOCIAL HISTORY:  She is a widow and lives along. She is retired.  She has  never been a smoker, denies any alcohol use.   REVIEW OF SYSTEMS:  Please see HPI for GI.  GENERAL:  Denies any weight  loss.  CARDIOPULMONARY:  Denies any shortness of breath or chest pain.   PHYSICAL EXAMINATION:  VITAL SIGNS:  Weight 153 pounds. Height 59 inches.  Temperature 97, blood pressure 136/70, pulse 73, respirations 12.  GENERAL:  A pleasant, well-nourished, well-developed, elderly Caucasian  female in no acute distress.  She is accompanied by her sister.  SKIN:  Warm and dry.  No jaundice.  HEENT:  Pupils are equal, round, and reacted to light.  Conjunctivae are  pink.  Sclerae anicteric.  Oropharyngeal mucosa moist and pink.  No lesions,  erythema or exudate.  No lymphadenopathy or thyromegaly.  CHEST:  Lungs are clear to auscultation.  CARDIOVASCULAR:  Cardiac exam reveals regular rate and rhythm.  Normal S1,  S2.  No murmurs, rubs,  or gallops.  ABDOMEN:  Positive bowel sounds, although hypoactive.  Soft, nondistended.  She has mild left lower quadrant tenderness to deep palpation.  No rebound  tenderness or guarding.  No organomegaly or masses.  EXTREMITIES:  No edema.  RECTAL:  Examination done in the ED revealed Hemoccult negative stool.   IMPRESSION:  Ms. Hesser is a pleasant, 77 year old lady with a 1-week history  of left lower quadrant abdominal pain; developed of large volume  hematochezia; who now has a possible apple core lesion seen in the  descending colon on CT.  This finding is worrisome.  She needs to have a  colonoscopy for further evaluation.   SUGGESTIONS:  1. Colonoscopy.  2. CBC, MET-7 in the morning.   I would like to thank Dr. [name] for allowing [place] to take part in the care of  this patient.      ___________________________________________                                            Tana Coast, P.A.   LL/MEDQ  D:  09/20/2003  T:  09/20/2003  Job:  [id number]   cc:   Kingsley Callander. [name], M.D.  [address]  [place]  [place] [address]  Fax: [phone]   R. Roetta Sessions, M.D.  [address]  [place]  [place] [address]  Fax: [phone]

## 2011-03-12 NOTE — Op Note (Signed)
[name], [name]                           ACCOUNT NO.:  [id number]   MEDICAL RECORD NO.:  [id number]                   PATIENT TYPE:  INP   LOCATION:  A314                                 FACILITY:  [facility]   PHYSICIAN:  R. Roetta Sessions, M.D.              DATE OF BIRTH:  Mar 21, 1923   DATE OF PROCEDURE:  09/23/2003  DATE OF DISCHARGE:                                 OPERATIVE REPORT   PROCEDURE:  Colonoscopy with biopsy.   ENDOSCOPIST:  Gerrit Friends. Rourk, M.D.   INDICATIONS FOR PROCEDURE:  The patient is a 77 year old lady admitted to  the hospital with painless hematochezia.  It is notable that she was  Hemoccult negative on admission and serial H&H's failed to demonstrate a  significant drop in her hemoglobin. Her hemoglobin and hematocrit remain in  the normal range at 12.6 and 37.0.  It is notable that this lady had a CT  scan performed which revealed questionable thickened fold on image in the  area of the area of the descending colon/splenic flexure.  Because of the  abnormality on CT and her complaint of hematochezia, colonoscopy is now  being done.  She has not had any further rectal bleeding since being  admitted to the hospital.  This approach has been discussed with the patient  at length.  The potential risks, benefits, and alternatives have been  reviewed, questions answered. She is agreeable.   PROCEDURE NOTE:  O2 saturation, blood pressure, pulse and respirations were  monitored throughout the entire procedure.  Conscious sedation: Versed 4 mg IV, Demerol 75 mg IV in divided doses.   INSTRUMENT:  Olympus video chip pediatric colonoscope.   FINDINGS:  A digital rectal exam revealed no abnormalities.   ENDOSCOPIC FINDINGS:  The prep was adequate.   RECTUM:  Examination of the rectal mucosa including the retroflex view of  the anal verge revealed only some internal hemorrhoids.   COLON:  The colonic mucosa was surveyed from the rectosigmoid junction  through the  left transverse and right colon to the area of the appendiceal  orifice, ileocecal valve, and cecum.  These structures were well seen and  photographed for the record.  The following abnormalities were noted:  (1)  The patient had numerous sigmoid diverticula.  (2) There were two 3-mm  polyps; one at the splenic flexure and one at the cecum; they were both cold  biopsied/removed.   From the level of the cecum and ileocecal valve the scope was slowly and  cautiously withdrawn.  All previously mentioned mucosal surfaces were again  seen; and, again, no other abnormalities were observed.  The patient  tolerated the procedure well and was reacted in endoscopy.   IMPRESSION:  1. Internal hemorrhoids otherwise normal rectum.  2. Sigmoid diverticula.  3. The remainder of the polyps at the splenic flexure and cecum cold     biopsied/removed.  4. The remainder  of the colonic mucosa appeared normal.  5. There was no blood in the lower gastrointestinal tract.   I suspect that the patient may have bled from either hemorrhoids of a  diverticula bleed.  At any rate, the bleeding seems to have been relatively  trivial. The abnormality on the CT was most likely artifactual.   RECOMMENDATIONS:  1. No further GI workup as long as the patient remains without symptoms.  2. Will follow up on path report from polyps taken out today.  3. Advance diet.  4. Hopefully home in the next 24 hours.      ___________________________________________                                            [name], M.D.   RMR/MEDQ  D:  09/23/2003  T:  09/23/2003  Job:  [id number]

## 2011-03-12 NOTE — Op Note (Signed)
[name], [name]                           ACCOUNT NO.:  [id number]   MEDICAL RECORD NO.:  [id number]                   PATIENT TYPE:  AMB   LOCATION:  DAY                                  FACILITY:  [facility]   PHYSICIAN:  Lionel December, M.D.                 DATE OF BIRTH:  September 26, 1923   DATE OF PROCEDURE:  11/29/2002  DATE OF DISCHARGE:                                 OPERATIVE REPORT   PROCEDURE:  Esophagogastroduodenoscopy with esophageal dilatation.   INDICATIONS FOR PROCEDURE:  Ms. Benedicto is a 77 year old Caucasian female with  chronic GERD, known hiatal hernia who has been maintained on Ranitidine who  has solid food dysphagia. She is also on Fosamax for osteoporosis. The  procedure is reviewed with the patient and informed consent was obtained.   PREOP MEDICATIONS:  Cetacaine spray for pharyngeal topical anesthesia,  Demerol 15 mg IV, Versed 4.5 mg IV.   INSTRUMENT:  Olympus video system.   FINDINGS:  Procedure performed in endoscopy suite. The patient's vital signs  and O2 sat were monitored during the procedure and remained stable. The  patient was placed in the left lateral decubitus position, endoscope was  passed through oropharynx without any difficulty into the esophagus.   ESOPHAGUS:  The mucosa of the esophagus normal. Body was tortuous. The GE  junction was located at 32 cm from the incisors. There was no obvious ring  or stricture. Focal erythema was noted in the gastric side of the Z line.  There was a moderate size sliding hiatal hernia which was 6 cm in length.  There were streaks of erythematous mucosa involving the herniated part of  the stomach.   STOMACH:  The rest of the stomach exam was normal. The pyloric channel was  patent. The hernia could be easily seen on the retroflexed view. The  angularis was unremarkable.   DUODENUM:  Examination of the bulb, second and third part of the duodenum  was normal. The endoscope was withdrawn.   Esophageal  dilatation was attempted by passing a 56 French Maloney dilator  but I could not advance it completely. This dilator was therefore withdrawn.  The guidewire was passed through the scope and left in position. The  endoscope was passed again along side the wire to make sure that the tip was  well into the stomach. The endoscope was withdrawn and an 18 mm Savary  dilator was advanced over the guidewire without any difficulty. I was able  to pass this dilator completely without any difficulty. The esophagus was  reexamined after dilation and there was no mucosal injury or tear. The  endoscope was withdrawn. The patient tolerated the procedure well.   FINAL DIAGNOSES:  1. Moderate size sliding hiatal hernia with mucosal inflammation involving     the herniated part of the stomach.  2. No obvious ring or stricture noted to esophagus or GE junction.  3. Esophagus could not be dilated with Maloney dilator and subsequently     completely with Savary dilator using guidewire as described above.   RECOMMENDATIONS:  1. Will stop her Zantac and start her on Prilosec 20 mg p.o. q.a.m. (over     the counter).  2. She will call if she remains with dysphagia. If this is the case, I would     like for her to hold off  Fosamax and go from there.                                               Lionel December, M.D.    NR/MEDQ  D:  11/29/2002  T:  11/29/2002  Job:  [id number]   cc:   Kingsley Callander. [name], M.D.  [address]  [place]  [place] [address]  Fax: [phone]

## 2011-03-12 NOTE — Discharge Summary (Signed)
NAMECAROLL, [name]                             ACCOUNT NO.:  [id number]   MEDICAL RECORD NO.:  [id number]                  PATIENT TYPE:   LOCATION:                                       FACILITY:   PHYSICIAN:  Kingsley Callander. [name], M.D.                  DATE OF BIRTH:   DATE OF ADMISSION:  09/20/2003  DATE OF DISCHARGE:  09/23/2003                                 DISCHARGE SUMMARY   DISCHARGE DIAGNOSES:  1. Lower gastrointestinal bleed.  2. Hypertension.  3. Osteoporosis.  4. Lumbar disk disease.  5. Hypokalemia.   PROCEDURES:  1. CT scan of the abdomen and pelvis.  2. Colonoscopy.   HOSPITAL COURSE:  This patient is a 77 year old white female who presented  to the emergency room after passing a large amount of blood with the stool.  Her hemoglobin dropped from 14.5 to 12.6.  She was seen in consultation by  Dr. Jena Gauss.  She underwent a colonoscopy which revealed internal hemorrhoids  and two 3-mm polyps both of which were hyperplastic.  It was felt that her  bleed was likely hemorrhoidal, or possibly related to diverticulosis.  She  did have sigmoid diverticulosis present on her colonoscopy as well.  Following admission she had no recurrent bleeding.  Her hemoglobin  stabilized.   She was hypokalemic at 3.4 and was supplemented orally. Her potassium  normalized to 4.0 prior to discharge.  Her hemoglobin stabilized at 12.6.  She has improved and is stable for discharge on November 29.   Her CT scan had shown a prominent fold which was questioned by the  radiologist to be an apple core lesion.  No such finding, though, was found  at colonoscopy.   DISCHARGE MEDICATIONS:  1. Fosamax 70 mg weekly.  2. Prilosec 20 mg daily.  3. Hydrochlorothiazide 25 mg daily.  4. Caltrate b.i.d.  5. Xanax 0.25 mg t.i.d. p.r.n.     ___________________________________________                                         Kingsley Callander. [name], M.D.   ROF/MEDQ  D:  09/30/2003  T:  09/30/2003  Job:  [id number]

## 2011-03-12 NOTE — Consult Note (Signed)
[name], [name]                             ACCOUNT NO.:  [id number]   MEDICAL RECORD NO.:  [id number]                  PATIENT TYPE:   LOCATION:                                       FACILITY:   PHYSICIAN:  [name], M.D.                 DATE OF BIRTH:  05-01-1923   DATE OF CONSULTATION:  11/15/2002  DATE OF DISCHARGE:                                   CONSULTATION   PHYSICIAN REQUESTING CONSULTATION:  [name], M.D.   REASON FOR CONSULTATION:  Dysphagia.   HISTORY OF PRESENT ILLNESS:  The patient is a 77 year old Caucasian female  patient of Dr. Ouida [name] who presents today for further evaluation of dysphagia.  She complains of a couple-month history of dysphagia primarily to solid  foods.  It happens approximately once a week with rest.  She will swallow  food and feel it stick in her esophagus.  Sometimes she has to regurgitate  this because it will not pass.  She denies any dysphagia to liquids.  She  rarely has heartburn.  She is on ranitidine 150 mg q.h.s.  Denies any  abdominal pain, nausea, constipation, diarrhea, melena, or rectal bleeding.  Denies any weight loss.   CURRENT MEDICATIONS:  1. Ranitidine 150 mg q.h.s.  2. Hydrochlorothiazide 25 mg daily.  3. Caltrate 600 Plus D 2 a day.  4. Roxicet 5/325 mg 1 q.4h. p.r.n.  5. Xanax 1 t.i.d. p.r.n.  6. Lexapro 10 mg daily.  7. Fosamax 70 mg 1 weekly.   ALLERGIES:  No known drug allergies.   PAST MEDICAL HISTORY:  1. Arthritis.  2. Depression.  3. Hypertension.  4. Gastroesophageal reflux disease.  5. Hypercholesterolemia, recently diagnosed, on diet and exercise.  6. She has a history of moderate-size sliding hiatal hernia with small ulcer     and erosion at EG junction seen on EGD in [name] 2000.  She also had a     3-mm polyp from the transverse colon on colonoscopy at that time as well.     This was an inflammatory polyp.   PAST SURGICAL HISTORY:  Hysterectomy, right breast lumpectomy.  She has  had  surgery on both of her feet.   FAMILY HISTORY:  Mother died of old age at 96-1/2.  Father died of stroke.  No family history of colorectal cancer.  She had a brother who had stomach  cancer.   SOCIAL HISTORY:  She is widowed.  She lives alone.  She is retired.  She has  never been a smoker.  Denies any alcohol use.   REVIEW OF SYSTEMS:  Please see HPI for GI and for general.  CARDIOPULMONARY:  Denies any shortness of breath or chest pain.   PHYSICAL EXAMINATION:  VITAL SIGNS:  Weight 160 pounds, height 4 feet 9  inches, temperature 97, blood pressure 128/72, pulse 70.  GENERAL:  A pleasant, well-nourished, well-developed,  elderly Caucasian  female in no acute distress.  She is accompanied by a friend.  SKIN:  Warm and dry.  No jaundice.  HEENT:  Pupils are equal, round, and reactive to light.  Conjunctivae are  pink.  Sclerae are nonicteric.  Oropharyngeal mucosa moist and pink.  No  lesions, erythema, or exudate.  No lymphadenopathy, thyromegaly, or carotid  bruits.  CHEST:  Lungs are clear to auscultation.  CARDIAC:  Regular rate and rhythm.  Normal S1, S2.  No murmurs, rubs, or  gallops.  ABDOMEN:  Positive bowel sounds.  Soft, nontender, nondistended.  No  organomegaly or masses  EXTREMITIES:  No edema.   IMPRESSION:  The patient is a pleasant 77 year old lady with a couple-month  history of dysphagia to solid foods.  She also has a history of  gastroesophageal reflux disease.  Dysphagia may be due to esophageal web,  ring, or stricture.  I discussed risks, alternatives, and benefits of EGD  with dilatation.  The patient is agreeable to proceed.   PLAN:  1. She will continue ranitidine 150 mg q.h.s. for now.  2. Esophagogastroduodenoscopy with dilatation in the near future.  3. We will have her hold her Fosamax until after the procedure.   I would like to thank Dr. Ouida [name] for allowing [place] to take part in the care of  this patient.     [name], P.A.                         [name], M.D.    LL/MEDQ  D:  11/15/2002  T:  11/16/2002  Job:  [id number]

## 2011-06-08 ENCOUNTER — Emergency Department (HOSPITAL_COMMUNITY): Payer: Medicare Other

## 2011-06-08 ENCOUNTER — Encounter: Payer: Self-pay | Admitting: Emergency Medicine

## 2011-06-08 ENCOUNTER — Inpatient Hospital Stay (HOSPITAL_COMMUNITY)
Admission: EM | Admit: 2011-06-08 | Discharge: 2011-06-18 | DRG: 356 | Disposition: A | Discharge status: Home Health | Payer: Medicare Other | Attending: Internal Medicine | Admitting: Internal Medicine

## 2011-06-08 DIAGNOSIS — K254 Chronic or unspecified gastric ulcer with hemorrhage: Secondary | ICD-10-CM | POA: Diagnosis present

## 2011-06-08 DIAGNOSIS — K219 Gastro-esophageal reflux disease without esophagitis: Secondary | ICD-10-CM

## 2011-06-08 DIAGNOSIS — D500 Iron deficiency anemia secondary to blood loss (chronic): Secondary | ICD-10-CM | POA: Diagnosis present

## 2011-06-08 DIAGNOSIS — K2970 Gastritis, unspecified, without bleeding: Secondary | ICD-10-CM

## 2011-06-08 DIAGNOSIS — K551 Chronic vascular disorders of intestine: Principal | ICD-10-CM | POA: Diagnosis present

## 2011-06-08 DIAGNOSIS — R5381 Other malaise: Secondary | ICD-10-CM | POA: Diagnosis present

## 2011-06-08 DIAGNOSIS — G20A1 Parkinson's disease without dyskinesia, without mention of fluctuations: Secondary | ICD-10-CM | POA: Diagnosis present

## 2011-06-08 DIAGNOSIS — K315 Obstruction of duodenum: Secondary | ICD-10-CM | POA: Diagnosis present

## 2011-06-08 DIAGNOSIS — K2100 Gastro-esophageal reflux disease with esophagitis, without bleeding: Secondary | ICD-10-CM | POA: Diagnosis present

## 2011-06-08 DIAGNOSIS — G20 Parkinson's disease: Secondary | ICD-10-CM | POA: Diagnosis present

## 2011-06-08 DIAGNOSIS — N289 Disorder of kidney and ureter, unspecified: Secondary | ICD-10-CM | POA: Diagnosis present

## 2011-06-08 DIAGNOSIS — N390 Urinary tract infection, site not specified: Secondary | ICD-10-CM | POA: Diagnosis present

## 2011-06-08 DIAGNOSIS — B951 Streptococcus, group B, as the cause of diseases classified elsewhere: Secondary | ICD-10-CM | POA: Diagnosis present

## 2011-06-08 DIAGNOSIS — E860 Dehydration: Secondary | ICD-10-CM

## 2011-06-08 DIAGNOSIS — I2699 Other pulmonary embolism without acute cor pulmonale: Secondary | ICD-10-CM | POA: Diagnosis present

## 2011-06-08 DIAGNOSIS — E871 Hypo-osmolality and hyponatremia: Secondary | ICD-10-CM | POA: Diagnosis present

## 2011-06-08 HISTORY — DX: Essential (primary) hypertension: I10

## 2011-06-08 HISTORY — DX: Major depressive disorder, single episode, unspecified: F32.9

## 2011-06-08 HISTORY — DX: Gastro-esophageal reflux disease without esophagitis: K21.9

## 2011-06-08 HISTORY — DX: Parkinson's disease: G20

## 2011-06-08 HISTORY — DX: Parkinson's disease without dyskinesia, without mention of fluctuations: G20.A1

## 2011-06-08 HISTORY — DX: Depression, unspecified: F32.A

## 2011-06-08 LAB — BASIC METABOLIC PANEL
CO2: 18 mEq/L — ABNORMAL LOW (ref 19–32)
Chloride: 98 mEq/L (ref 96–112)
Creatinine, Ser: 1.24 mg/dL — ABNORMAL HIGH (ref 0.50–1.10)
GFR calc Af Amer: 50 mL/min — ABNORMAL LOW (ref >60)
Sodium: 132 mEq/L — ABNORMAL LOW (ref 135–145)

## 2011-06-08 LAB — URINALYSIS, ROUTINE W REFLEX MICROSCOPIC
Glucose, UA: NEGATIVE mg/dL
Ketones, ur: NEGATIVE mg/dL
Nitrite: NEGATIVE
Protein, ur: 30 mg/dL — AB
pH: 5.5 (ref 5.0–8.0)

## 2011-06-08 LAB — HEPATIC FUNCTION PANEL
ALT: <5 U/L (ref 0–35)
AST: 14 U/L (ref 0–37)
Bilirubin, Direct: 0.1 mg/dL (ref 0.0–0.3)
Indirect Bilirubin: 0.3 mg/dL (ref 0.3–0.9)
Total Bilirubin: 0.4 mg/dL (ref 0.3–1.2)

## 2011-06-08 LAB — CBC
MCHC: 33.3 g/dL (ref 30.0–36.0)
Platelets: 296 10*3/uL (ref 150–400)
RDW: 15.2 % (ref 11.5–15.5)
WBC: 27.6 10*3/uL — ABNORMAL HIGH (ref 4.0–10.5)

## 2011-06-08 LAB — DIFFERENTIAL
Basophils Absolute: 0 10*3/uL (ref 0.0–0.1)
Basophils Relative: 0 % (ref 0–1)
Lymphocytes Relative: 3 % — ABNORMAL LOW (ref 12–46)
Neutro Abs: 24.5 10*3/uL — ABNORMAL HIGH (ref 1.7–7.7)
Neutrophils Relative %: 89 % — ABNORMAL HIGH (ref 43–77)

## 2011-06-08 LAB — URINE MICROSCOPIC-ADD ON

## 2011-06-08 MED ORDER — SODIUM CHLORIDE 0.9 % IV BOLUS (SEPSIS)
1000.0000 mL | Freq: Once | INTRAVENOUS | Status: AC
  Administered 2011-06-08: 1000 mL via INTRAVENOUS

## 2011-06-08 MED ORDER — ONDANSETRON HCL 4 MG/2ML IJ SOLN
4.0000 mg | Freq: Once | INTRAMUSCULAR | Status: AC
  Administered 2011-06-08: 4 mg via INTRAVENOUS
  Filled 2011-06-08: qty 2

## 2011-06-08 MED ORDER — PANTOPRAZOLE SODIUM 40 MG IV SOLR: 40.0000 mg | Freq: Once | INTRAVENOUS | Status: DC

## 2011-06-08 MED ORDER — PANTOPRAZOLE SODIUM 40 MG IV SOLR
40.0000 mg | Freq: Once | INTRAVENOUS | Status: AC
  Administered 2011-06-08: 40 mg via INTRAVENOUS
  Filled 2011-06-08: qty 40

## 2011-06-08 MED ORDER — CEFTRIAXONE SODIUM 1 G IJ SOLR: 1.0000 g | Freq: Once | INTRAMUSCULAR | Status: DC

## 2011-06-08 MED ORDER — ONDANSETRON HCL 4 MG/2ML IJ SOLN: 4.0000 mg | Freq: Once | INTRAMUSCULAR | Status: DC

## 2011-06-08 MED ORDER — DEXTROSE 5 % IV SOLN
INTRAVENOUS | Status: AC
  Administered 2011-06-08: 23:00:00 via INTRAVENOUS
  Filled 2011-06-08: qty 1

## 2011-06-08 MED ORDER — MORPHINE SULFATE 2 MG/ML IJ SOLN
2.0000 mg | Freq: Once | INTRAMUSCULAR | Status: AC
  Administered 2011-06-08: 2 mg via INTRAVENOUS
  Filled 2011-06-08: qty 1

## 2011-06-08 NOTE — ED Provider Notes (Addendum)
Scribed for Dr. Adriana Simas, the patient was seen in room 18. This chart was scribed by Hillery Hunter. This patient's care was started at 21:15.   History     CSN: [id number] Arrival date & time: 06/08/2011  8:53 PM  Chief Complaint  Patient presents with  . Fever  . Nausea  . Emesis   HPI  [name] is a 77 y.o. female who presents to the Emergency Department complaining of pain in the lower abdomen over the supra pubic area that radiates to posterior left shoulder. The patient provides some history and describes that she has had a few episodes of vomiting and her daughter and adopted daughter present provides additional history. The patients daughter also reports that she went to Dr. [name] to get nauseau medicine for the patient that the patient has been unable to keep down. A sample of the patient's emesis at the bedside appears to be coffee-ground in quality. She describes eating a small meal today but not being able to keep down food or fluids. She denies similar symptoms previously, melena,  or any history of ulcers. The patient was formally in an assisted living facility but has been back home since July 13.     Past Medical History  Diagnosis Date  . Hypertension   . GERD (gastroesophageal reflux disease)   . Parkinson disease   . Depression   Hiatal hernia  History reviewed. No pertinent past surgical history.  No family history on file.  History  Substance Use Topics  . Smoking status: Never Smoker   . Smokeless tobacco: Not on file  . Alcohol Use: No    Review of Systems  Gastrointestinal: Positive for nausea, vomiting and constipation (chronic, but moving bowels okay today). Negative for blood in stool and anal bleeding.  Psychiatric/Behavioral: Negative for behavioral problems and confusion.  All other systems reviewed and are negative.    Physical Exam  BP 137/85  Pulse 110  Temp(Src) 99.2 F (37.3 C) (Rectal)  Resp 20  Ht 4\' 8"  (1.422 m)  Wt  120 lb (54.432 kg)  BMI 26.90 kg/m2  SpO2 95%  Physical Exam  Nursing note and vitals reviewed. Constitutional: She is oriented to person, place, and time. No distress.       Appears elderly and frail  HENT:  Head: Normocephalic and atraumatic.  Eyes: No scleral icterus.  Neck: Neck supple.  Cardiovascular: Normal rate and regular rhythm.   Pulmonary/Chest: Effort normal. No respiratory distress.  Abdominal: She exhibits no distension. There is tenderness (slight epigastric).  Musculoskeletal: Normal range of motion. She exhibits no edema and no tenderness.  Neurological: She is alert and oriented to person, place, and time.  Skin: Skin is warm and dry.  Psychiatric: She has a normal mood and affect. Her behavior is normal.    ED Course  Procedures  DIAGNOSTIC STUDIES: Oxygen Saturation is 100% on room air, normal by my interpretation.    LABS / RADIOLOGY:  Results for orders placed during the hospital encounter of 06/08/11  CBC      Component Value Range   WBC 27.6 (*) 4.0 - 10.5 (K/uL)   RBC 4.43  3.87 - 5.11 (MIL/uL)   Hemoglobin 13.6  12.0 - 15.0 (g/dL)   HCT 91.4  78.2 - 95.6 (%)   MCV 92.1  78.0 - 100.0 (fL)   MCH 30.7  26.0 - 34.0 (pg)   MCHC 33.3  30.0 - 36.0 (g/dL)   RDW 21.3  08.6 -  15.5 (%)   Platelets 296  150 - 400 (K/uL)  DIFFERENTIAL      Component Value Range   Neutrophils Relative 89 (*) 43 - 77 (%)   Neutro Abs 24.5 (*) 1.7 - 7.7 (K/uL)   Lymphocytes Relative 3 (*) 12 - 46 (%)   Lymphs Abs 1.0  0.7 - 4.0 (K/uL)   Monocytes Relative 8  3 - 12 (%)   Monocytes Absolute 2.1 (*) 0.1 - 1.0 (K/uL)   Eosinophils Relative 0  0 - 5 (%)   Eosinophils Absolute 0.0  0.0 - 0.7 (K/uL)   Basophils Relative 0  0 - 1 (%)   Basophils Absolute 0.0  0.0 - 0.1 (K/uL)  BASIC METABOLIC PANEL      Component Value Range   Sodium 132 (*) 135 - 145 (mEq/L)   Potassium 5.0  3.5 - 5.1 (mEq/L)   Chloride 98  96 - 112 (mEq/L)   CO2 18 (*) 19 - 32 (mEq/L)   Glucose, Bld 198  (*) 70 - 99 (mg/dL)   BUN 29 (*) 6 - 23 (mg/dL)   Creatinine, Ser 0.45 (*) 0.50 - 1.10 (mg/dL)   Calcium 40.9  8.4 - 10.5 (mg/dL)   GFR calc non Af Amer 41 (*) >60 (mL/min)   GFR calc Af Amer 50 (*) >60 (mL/min)  URINALYSIS, ROUTINE W REFLEX MICROSCOPIC      Component Value Range   Color, Urine YELLOW  YELLOW    Appearance HAZY (*) CLEAR    Specific Gravity, Urine 1.020  1.005 - 1.030    pH 5.5  5.0 - 8.0    Glucose, UA NEGATIVE  NEGATIVE (mg/dL)   Hgb urine dipstick SMALL (*) NEGATIVE    Bilirubin Urine SMALL (*) NEGATIVE    Ketones, ur NEGATIVE  NEGATIVE (mg/dL)   Protein, ur 30 (*) NEGATIVE (mg/dL)   Urobilinogen, UA 0.2  0.0 - 1.0 (mg/dL)   Nitrite NEGATIVE  NEGATIVE    Leukocytes, UA LARGE (*) NEGATIVE   URINE MICROSCOPIC-ADD ON      Component Value Range   Squamous Epithelial / LPF RARE  RARE    WBC, UA TOO NUMEROUS TO COUNT  <3 (WBC/hpf)   RBC / HPF 3-6  <3 (RBC/hpf)   Bacteria, UA MANY (*) RARE   HEPATIC FUNCTION PANEL      Component Value Range   Total Protein 8.5 (*) 6.0 - 8.3 (g/dL)   Albumin 3.4 (*) 3.5 - 5.2 (g/dL)   AST 14  0 - 37 (U/L)   ALT <5  0 - 35 (U/L)   Alkaline Phosphatase 86  39 - 117 (U/L)   Total Bilirubin 0.4  0.3 - 1.2 (mg/dL)   Bilirubin, Direct 0.1  0.0 - 0.3 (mg/dL)   Indirect Bilirubin 0.3  0.3 - 0.9 (mg/dL)  LIPASE, BLOOD      Component Value Range   Lipase 48  11 - 59 (U/L)   X-Ray Abdomen / Chest IMPRESSION: Fibrosis or linear atelectasis in the lung bases. No evidence of active airspace disease. Nonobstructive bowel gas pattern.  Original Report Authenticated By: Marlon Pel, M.D.    ED COURSE / COORDINATION OF CARE: IV NS fluids ordered for hydration 2200: Protonoxic 40mg , Zofran 4mg s administered for ulcers 23:05. Patient would like something for pain. Ordered CT Abdomen   MDM:   level V cavity urgent need for intervention, severe illness. Critical care 30 minutes. [name] is an elderly frail woman who is been  vomiting  coffee ground emesis all day. She is pale and tachycardic. No black stools blood pressure stable pulse 110 acute abdominal series shows no free air. White count greater than 25,000. Will Rx with IV fluids, IV Protonix, IV Zofran. CT scan of abdomen and pelvis pending discuss with family in great detail. Also discussed with Dr. Megan Mans who will admit patient. Critical l care time for evaluation of patient, evaluation with laboratory and x-ray, discussion with family and consulting.   Scribe Attestation  I personally performed the services described in this documentation, which was scribed in my presence. The recorded information has been reviewed and considered. Donnetta Hutching, MD     Donnetta Hutching, MD 06/08/11 2352  Donnetta Hutching, MD 06/22/11 [phone]

## 2011-06-08 NOTE — ED Notes (Signed)
Yesterday nausea, vomiting, fever, constipation since last Saturday.

## 2011-06-09 ENCOUNTER — Ambulatory Visit (HOSPITAL_COMMUNITY): Payer: Medicare Other

## 2011-06-09 ENCOUNTER — Ambulatory Visit (HOSPITAL_COMMUNITY)
Admit: 2011-06-09 | Discharge: 2011-06-09 | Disposition: A | Discharge status: Home / Self Care | Payer: Medicare Other | Source: Ambulatory Visit | Attending: Internal Medicine | Admitting: Internal Medicine

## 2011-06-09 ENCOUNTER — Encounter (HOSPITAL_COMMUNITY): Payer: Self-pay

## 2011-06-09 DIAGNOSIS — K922 Gastrointestinal hemorrhage, unspecified: Secondary | ICD-10-CM | POA: Insufficient documentation

## 2011-06-09 DIAGNOSIS — K270 Acute peptic ulcer, site unspecified, with hemorrhage: Secondary | ICD-10-CM

## 2011-06-09 DIAGNOSIS — I2699 Other pulmonary embolism without acute cor pulmonale: Secondary | ICD-10-CM | POA: Insufficient documentation

## 2011-06-09 DIAGNOSIS — K311 Adult hypertrophic pyloric stenosis: Secondary | ICD-10-CM

## 2011-06-09 MED ORDER — IOHEXOL 300 MG/ML  SOLN
80.0000 mL | Freq: Once | INTRAMUSCULAR | Status: AC | PRN
  Administered 2011-06-09: 80 mL via INTRAVENOUS

## 2011-06-09 MED ORDER — DEXTROMETHORPHAN POLISTIREX 30 MG/5ML PO LQCR
15.0000 mg | Freq: Two times a day (BID) | ORAL | Status: DC | PRN
  Administered 2011-06-16 – 2011-06-17 (×2): 15 mg via ORAL
  Filled 2011-06-09 (×3): qty 5

## 2011-06-09 MED ORDER — PANTOPRAZOLE SODIUM 40 MG PO TBEC: 40.0000 mg | DELAYED_RELEASE_TABLET | Freq: Every day | ORAL | Status: DC

## 2011-06-09 MED ORDER — IOHEXOL 300 MG/ML  SOLN
150.0000 mL | Freq: Once | INTRAMUSCULAR | Status: AC | PRN
  Administered 2011-06-09: 65 mL via INTRAVENOUS

## 2011-06-09 MED ORDER — MIRTAZAPINE 30 MG PO TABS: 15.0000 mg | ORAL_TABLET | Freq: Every day | ORAL | Status: DC

## 2011-06-09 MED ORDER — LISINOPRIL 10 MG PO TABS: 40.0000 mg | ORAL_TABLET | Freq: Every day | ORAL | Status: DC

## 2011-06-09 MED ORDER — PHENOL 1.4 % MT LIQD
1.0000 | OROMUCOSAL | Status: DC | PRN
  Administered 2011-06-17: 1 via OROMUCOSAL
  Filled 2011-06-09: qty 177

## 2011-06-09 MED ORDER — POLYVINYL ALCOHOL 1.4 % OP SOLN
1.0000 [drp] | Freq: Every day | OPHTHALMIC | Status: DC
  Administered 2011-06-10 – 2011-06-18 (×8): 1 [drp] via OPHTHALMIC
  Filled 2011-06-09 (×2): qty 15

## 2011-06-09 MED ORDER — PAROXETINE HCL 20 MG PO TABS: 10.0000 mg | ORAL_TABLET | Freq: Every day | ORAL | Status: DC

## 2011-06-09 MED ORDER — SODIUM CHLORIDE 0.9 % IV SOLN
INTRAVENOUS | Status: DC
  Administered 2011-06-09 – 2011-06-13 (×5): via INTRAVENOUS

## 2011-06-09 MED ORDER — DEXTROSE 5 % IV SOLN
1.0000 g | INTRAVENOUS | Status: DC
  Administered 2011-06-10 – 2011-06-18 (×9): 1 g via INTRAVENOUS
  Filled 2011-06-09 (×12): qty 1

## 2011-06-09 MED ORDER — CARBIDOPA-LEVODOPA-ENTACAPONE 25-100-200 MG PO TABS: 1.0000 | ORAL_TABLET | Freq: Three times a day (TID) | ORAL | Status: DC

## 2011-06-09 MED ORDER — ACETAMINOPHEN 325 MG PO TABS
650.0000 mg | ORAL_TABLET | Freq: Three times a day (TID) | ORAL | Status: DC | PRN
  Administered 2011-06-10 – 2011-06-17 (×5): 650 mg via ORAL
  Filled 2011-06-09 (×3): qty 2
  Filled 2011-06-09 (×2): qty 1
  Filled 2011-06-09: qty 2

## 2011-06-09 MED ORDER — AMITRIPTYLINE HCL 10 MG PO TABS
ORAL_TABLET | ORAL | Status: AC
  Administered 2011-06-09: 10 mg via ORAL
  Filled 2011-06-09: qty 1

## 2011-06-09 MED ORDER — IOHEXOL 300 MG/ML  SOLN
100.0000 mL | Freq: Once | INTRAMUSCULAR | Status: AC | PRN
  Administered 2011-06-09: 100 mL via INTRAVENOUS

## 2011-06-09 MED ORDER — ENTACAPONE 200 MG PO TABS
200.0000 mg | ORAL_TABLET | Freq: Three times a day (TID) | ORAL | Status: DC
  Filled 2011-06-09: qty 1

## 2011-06-09 MED ORDER — SODIUM CHLORIDE 0.9 % IV SOLN
Freq: Once | INTRAVENOUS | Status: AC
  Administered 2011-06-09: 09:00:00 via INTRAVENOUS

## 2011-06-09 MED ORDER — AMITRIPTYLINE HCL 10 MG PO TABS: 10.0000 mg | ORAL_TABLET | Freq: Every day | ORAL | Status: DC

## 2011-06-09 MED ORDER — CARBIDOPA-LEVODOPA 25-100 MG PO TABS
1.0000 | ORAL_TABLET | Freq: Three times a day (TID) | ORAL | Status: DC
  Filled 2011-06-09: qty 1

## 2011-06-09 NOTE — Consult Note (Signed)
Reason for Consult: Gastric out obstruction, GI bleed Referring Physician: Deniah [name] is an 77 y.o. female.  HPI: [name] is an 77 yr old white female admitted through the emergency dept yesterday.  While at home yesterday morning she began to vomit gray vomitus.  Her daughter went to Dr. [name] office and received a anti-nausea medication.  After taking the medication she ate a half an egg and toast for breakfast.  Around 4pm she began to vomit a thick vomitus. Her daughter took her to the ED where she began to vomit coffee ground vomitus.  This am, 3000cc dark brown/black stomach contents have been removed via NG.  Her daughter says her mother does have some dysphagia and has under EGD's in the past.  Her last EGD was in 2004 by DR. [name] which revealed a moderate sized hiatal hernia. No structure. She was empirically dilated with a 18 Savory dilator.  Her appetite has remained good. She is on a soft diet at home. She usually has a BM every 3 days. No melena or bright red rectal bleeding. She does have a history of chronic UTIs.   While in the ED she underwent a CT abdomen and pelvis with CM: Ct Abdomen Pelvis W Contrast::  IMPRESSION:  Prominent fluid distension of the stomach and duodenum to the level of the superior mesenteric artery consistent with vascular compression.  An intrinsic mass is not entirely excluded.  Large esophageal hiatal hernia.  Bilateral pleural effusions with basilar atelectasis.  Nonspecific nodular opacity measuring 8 mm diameter in the left lung base.  Follow-up recommended.  There is a filling defect in a right lower lung vessel.  Without complete visualization, I cannot determine whether this is in the artery or vein.  Chest CT is recommended to exclude pulmonary embolus.  Results discussed with Fredricka Bonine at the time dictation, of 40 hours on 06/09/2011  Original Report Authenticated By: Marlon Pel, M.D.   Dg Abd Acute W/chest  Past Medical History    Diagnosis Date  . Hypertension   . GERD (gastroesophageal reflux disease)   . Parkinson disease   . Depression     History reviewed. No pertinent past surgical history.  No family history on file.  Social History:  reports that she has never smoked. She does not have any smokeless tobacco history on file. She reports that she does not drink alcohol or use illicit drugs.  Allergies:  Allergies  Allergen Reactions  . Metoclopramide Hcl     Medications: I have reviewed the patient's current medications.  Results for orders placed during the hospital encounter of 06/08/11 (from the past 48 hour(s))  CBC     Status: Abnormal   Collection Time   06/08/11  8:55 PM      Component Value Range Comment   WBC 27.6 (*) 4.0 - 10.5 (K/uL)    RBC 4.43  3.87 - 5.11 (MIL/uL)    Hemoglobin 13.6  12.0 - 15.0 (g/dL)    HCT 04.5  40.9 - 81.1 (%)    MCV 92.1  78.0 - 100.0 (fL)    MCH 30.7  26.0 - 34.0 (pg)    MCHC 33.3  30.0 - 36.0 (g/dL)    RDW 91.4  78.2 - 95.6 (%)    Platelets 296  150 - 400 (K/uL)   DIFFERENTIAL     Status: Abnormal   Collection Time   06/08/11  8:55 PM      Component Value Range  Comment   Neutrophils Relative 89 (*) 43 - 77 (%)    Neutro Abs 24.5 (*) 1.7 - 7.7 (K/uL)    Lymphocytes Relative 3 (*) 12 - 46 (%)    Lymphs Abs 1.0  0.7 - 4.0 (K/uL)    Monocytes Relative 8  3 - 12 (%)    Monocytes Absolute 2.1 (*) 0.1 - 1.0 (K/uL)    Eosinophils Relative 0  0 - 5 (%)    Eosinophils Absolute 0.0  0.0 - 0.7 (K/uL)    Basophils Relative 0  0 - 1 (%)    Basophils Absolute 0.0  0.0 - 0.1 (K/uL)   BASIC METABOLIC PANEL     Status: Abnormal   Collection Time   06/08/11  8:55 PM      Component Value Range Comment   Sodium 132 (*) 135 - 145 (mEq/L)    Potassium 5.0  3.5 - 5.1 (mEq/L)    Chloride 98  96 - 112 (mEq/L)    CO2 18 (*) 19 - 32 (mEq/L)    Glucose, Bld 198 (*) 70 - 99 (mg/dL)    BUN 29 (*) 6 - 23 (mg/dL)    Creatinine, Ser 1.61 (*) 0.50 - 1.10 (mg/dL)    Calcium  09.6  8.4 - 10.5 (mg/dL)    GFR calc non Af Amer 41 (*) >60 (mL/min)    GFR calc Af Amer 50 (*) >60 (mL/min)   URINALYSIS, ROUTINE W REFLEX MICROSCOPIC     Status: Abnormal   Collection Time   06/08/11  9:08 PM      Component Value Range Comment   Color, Urine YELLOW  YELLOW     Appearance HAZY (*) CLEAR     Specific Gravity, Urine 1.020  1.005 - 1.030     pH 5.5  5.0 - 8.0     Glucose, UA NEGATIVE  NEGATIVE (mg/dL)    Hgb urine dipstick SMALL (*) NEGATIVE     Bilirubin Urine SMALL (*) NEGATIVE     Ketones, ur NEGATIVE  NEGATIVE (mg/dL)    Protein, ur 30 (*) NEGATIVE (mg/dL)    Urobilinogen, UA 0.2  0.0 - 1.0 (mg/dL)    Nitrite NEGATIVE  NEGATIVE     Leukocytes, UA LARGE (*) NEGATIVE    URINE MICROSCOPIC-ADD ON     Status: Abnormal   Collection Time   06/08/11  9:08 PM      Component Value Range Comment   Squamous Epithelial / LPF RARE  RARE     WBC, UA TOO NUMEROUS TO COUNT  <3 (WBC/hpf)    RBC / HPF 3-6  <3 (RBC/hpf)    Bacteria, UA MANY (*) RARE    HEPATIC FUNCTION PANEL     Status: Abnormal   Collection Time   06/08/11  9:44 PM      Component Value Range Comment   Total Protein 8.5 (*) 6.0 - 8.3 (g/dL)    Albumin 3.4 (*) 3.5 - 5.2 (g/dL)    AST 14  0 - 37 (U/L)    ALT <5  0 - 35 (U/L)    Alkaline Phosphatase 86  39 - 117 (U/L)    Total Bilirubin 0.4  0.3 - 1.2 (mg/dL)    Bilirubin, Direct 0.1  0.0 - 0.3 (mg/dL)    Indirect Bilirubin 0.3  0.3 - 0.9 (mg/dL)   LIPASE, BLOOD     Status: Normal   Collection Time   06/08/11  9:44 PM      Component  Value Range Comment   Lipase 48  11 - 59 (U/L)     Ct Abdomen Pelvis W Contrast  06/09/2011  *RADIOLOGY REPORT*  Clinical Data: Fever, nausea, coffee ground emesis.  White count over 20,000.  CT ABDOMEN AND PELVIS WITH CONTRAST  Technique:  Multidetector CT imaging of the abdomen and pelvis was performed following the standard protocol during bolus administration of intravenous contrast.  Contrast: 80 ml Omnipaque-300  Comparison:  05/1931 1005  Findings: Small bilateral pleural effusions, slightly greater on the right.  Atelectasis or scarring in the lung bases.  8 mm nodular opacity in the left lower lung posteriorly with a small air bronchogram or cavity.  This is probably related to the fibrosis and atelectasis.  However, follow-up is recommended to exclude significant pulmonary nodule.  If the patient is at high risk for bronchogenic carcinoma, follow- up chest CT at 3-6 months is recommended.  If the patient is at low risk for bronchogenic carcinoma, follow-up chest CT at 6-12 months is recommended.  This recommendation follows the consensus statement: Guidelines for Management of Small Pulmonary Nodules Detected on CT Scans: A Statement from the Fleischner Society as published in Radiology 2005; 237:395-400. Online at: DietDisorder.cz.  Large esophageal hiatal hernia.  There is a filling defect in the a right lower lung vessel.  Without visualization of the entire chest, I cannot tell whether this is of any are not artery or fluid in the bronchus.  Chest CT is recommended for further evaluation to exclude pulmonary embolus.  The stomach is markedly distended and fluid-filled.  The duodenum is distended to the midline with abrupt change in caliber as it crosses under the superior mesenteric artery consistent with obstruction, possibly due to a nutcracker effect.  The distal bowel is decompressed.  There is a small cyst in the liver adjacent to the gallbladder.  No solid masses are demonstrated.  The spleen size is normal.  The gallbladder and bile ducts are unremarkable.  The pancreas is somewhat atrophic.  The adrenal glands are normal.  Kidneys are atrophic with multiple small parenchymal cysts.  No hydronephrosis. No free fluid or air in the abdomen.  Calcification of nonaneurysmal abdominal aorta.  No retroperitoneal lymphadenopathy.  Pelvis:  There is diffuse nodular thickening of the bladder  wall. This is stable since the prior study.  No free or loculated pelvic fluid collections.  The uterus is atrophic.  Small cyst in the right ovary is stable.  No abnormal adnexal masses.  The appendix is normal.  Marked degenerative changes in the spine.  IMPRESSION:  Prominent fluid distension of the stomach and duodenum to the level of the superior mesenteric artery consistent with vascular compression.  An intrinsic mass is not entirely excluded.  Large esophageal hiatal hernia.  Bilateral pleural effusions with basilar atelectasis.  Nonspecific nodular opacity measuring 8 mm diameter in the left lung base.  Follow-up recommended.  There is a filling defect in a right lower lung vessel.  Without complete visualization, I cannot determine whether this is in the artery or vein.  Chest CT is recommended to exclude pulmonary embolus.  Results discussed with Fredricka Bonine at the time dictation, of 40 hours on 06/09/2011  Original Report Authenticated By: Marlon Pel, M.D.   Dg Abd Acute W/chest  06/08/2011  *RADIOLOGY REPORT*  Clinical Data: The suprapubic lower abdominal pain radiating to the posterior left shoulder.  ACUTE ABDOMEN SERIES (ABDOMEN 2 VIEW & CHEST 1 VIEW)  Comparison: 11/21/2010  Findings: Technically limited  examination due to patient rotation. Shallow inspiration.  Borderline heart size with normal pulmonary vascularity.  Interstitial changes in the lung bases probably representing fibrosis or linear atelectasis.  No focal airspace consolidation.  Tortuous aorta.  No pneumothorax.  No significant interval change.  No free air or abnormal air fluid levels in the abdomen.  Paucity of gas in the abdomen.  Small amount of gas and stool in the colon. No bowel dilatation.  Lumbar scoliosis with marked degenerative changes in the lumbar spine.  Stable appearance since previous study.  IMPRESSION: Fibrosis or linear atelectasis in the lung bases.  No evidence of active airspace disease.  Nonobstructive  bowel gas pattern.  Original Report Authenticated By: Marlon Pel, M.D.    Review of Systems  Constitutional: Negative for fever and chills.  Gastrointestinal: Positive for vomiting. Negative for abdominal pain, blood in stool and melena.   Blood pressure 103/71, pulse 102, temperature 98.6 F (37 C), temperature source Rectal, resp. rate 16, height 4\' 8"  (1.422 m), weight 120 lb (54.432 kg), SpO2 98.00%. Physical Exam Patient is alert, hard of hearing. NG tube in place, draining brown/black stomach contents. Sclera anicteric. Conjunctiva is pink.  Mucous membranes are moist. No thyroidmegaly.  Lungs clear. Heart rate regular. Abdomen soft, BS positive but sluggish. No tenderness to abdomen. No masses felt. No hepatomegaly. No edema to lower extremities. Skin is warm and dry. Assessment/Plan: [name] is an 77 yr old female admitted with gastric outlet obstruction.  A gastric out neoplasm or PUD needs to be ruled out. I discussed this case with Dr. Karilyn Cota. Will plan for an EGD tomorrow.  Dr. Karilyn Cota will be in later today to interview and examine patient.  Agree with repeat CBC today, Blood and urine cultures.  Taisei Bonnette W 06/09/2011, 9:27 AM

## 2011-06-10 ENCOUNTER — Inpatient Hospital Stay (HOSPITAL_COMMUNITY): Payer: Medicare Other

## 2011-06-10 ENCOUNTER — Encounter (HOSPITAL_COMMUNITY)
Admission: EM | Disposition: A | Discharge status: Home Health | Payer: Self-pay | Source: Home / Self Care | Attending: Internal Medicine

## 2011-06-10 HISTORY — PX: ESOPHAGOGASTRODUODENOSCOPY: SHX5428

## 2011-06-10 LAB — CBC
HCT: 29 % — ABNORMAL LOW (ref 36.0–46.0)
MCH: 30.8 pg (ref 26.0–34.0)
MCV: 92.9 fL (ref 78.0–100.0)
RDW: 16.3 % — ABNORMAL HIGH (ref 11.5–15.5)
WBC: 16.6 10*3/uL — ABNORMAL HIGH (ref 4.0–10.5)

## 2011-06-10 LAB — BASIC METABOLIC PANEL
BUN: 41 mg/dL — ABNORMAL HIGH (ref 6–23)
Chloride: 108 mEq/L (ref 96–112)
Creatinine, Ser: 1.63 mg/dL — ABNORMAL HIGH (ref 0.50–1.10)
GFR calc Af Amer: 36 mL/min — ABNORMAL LOW (ref >60)

## 2011-06-10 SURGERY — EGD (ESOPHAGOGASTRODUODENOSCOPY)
Anesthesia: Moderate Sedation

## 2011-06-10 MED ORDER — MIDAZOLAM HCL 5 MG/5ML IJ SOLN
INTRAMUSCULAR | Status: DC | PRN
  Administered 2011-06-10: 1 mg via INTRAVENOUS
  Administered 2011-06-10 (×2): 0.5 mg via INTRAVENOUS

## 2011-06-10 MED ORDER — BUTAMBEN-TETRACAINE-BENZOCAINE 2-2-14 % EX AERO
INHALATION_SPRAY | CUTANEOUS | Status: DC | PRN
  Administered 2011-06-10: 1 via TOPICAL

## 2011-06-10 MED ORDER — MIDAZOLAM HCL 5 MG/5ML IJ SOLN
INTRAMUSCULAR | Status: AC
  Filled 2011-06-10: qty 5

## 2011-06-10 NOTE — Progress Notes (Addendum)
Late Entry 1315 Patient transported back to room s/p EGD.  VS taken, found 58% oxygen saturation on 3 lpm [place].  Patient fairly drowsy, but would arouse with tactile stimulation.  Encouraged patient to take deep breaths.  Increased oxygen to 6 lpm, oxygen saturation increased to 65%.  MD notified.  New orders received for 100% nonrebreather, stat ABG, stat PCXR.  Nonrebreather placed on at 1330 with returning oxygen saturation of 95%.  Stat PCXR obtained.  Two unsuccessful attempts to obtain ABG.  MD notified by Arlyss Queen, RN, cancelled ABG.  MD notified of PCXR results by Arlyss Queen, RN.  Patient without s/s of respiratory distress and resting well.  Will continue to monitor patient and notify MD of any changes in patient condition.  Schonewitz, Candelaria Stagers 06/10/2011. 1345

## 2011-06-10 NOTE — OR Nursing (Signed)
NG tube pulled.

## 2011-06-10 NOTE — Progress Notes (Signed)
[name], [name]                 ACCOUNT NO.:  [id number]  MEDICAL RECORD NO.:  [id number]  LOCATION:  A338                          FACILITY:  [facility]  PHYSICIAN:  Kingsley Callander. [name], MD       DATE OF BIRTH:  1923/08/31  DATE OF PROCEDURE: DATE OF DISCHARGE:                                PROGRESS NOTE   [name] is comfortable this morning.  She denies any abdominal pain or vomiting.  She had a lower blood pressure earlier this morning 80/46, but is now improved to 116/64, pulse 62, respirations 18, temperature 97.9, oxygen saturation is 95%.  She has had her inferior vena cava filter placed yesterday after a CT scan revealed a pulmonary embolus in the right lung.  She is not a candidate for anticoagulation because of her upper GI bleed.  She has had a large output from her NG tube of over 3 liters.  It continues to look dark  PHYSICAL EXAMINATION:  LUNGS:  Clear. HEART:  Regular. ABDOMEN:  Nondistended and soft and nontender. EXTREMITIES:  Reveal no edema.  She has dressing in place in the right jugular region.  IMPRESSION/PLAN: 1. Upper gastrointestinal bleed, her hemoglobin is dropped from 13.6-     9.6.  Continue IV Protonix.  Further evaluation by Gastroenterology     is pending. 2. Pulmonary embolus, status post [place] filter placement.  This     is stable.  She is oxygenating satisfactorily. 3. Urinary tract infection, continue ceftriaxone.  Her white count is     dropped from 27.6-16.6. 4. Renal insufficiency.  BUN and creatinine have increased from 29 and     1.24 to 41 and 1.63.  She will be continued on her current IV     fluids at 100 mL of saline per hour. 5. Parkinson disease, stable.     Kingsley Callander. [name], MD     ROF/MEDQ  D:  06/10/2011  T:  06/10/2011  Job:  [id number]

## 2011-06-10 NOTE — H&P (Signed)
NAMEALTOVISE, [name]                 ACCOUNT NO.:  [id number]  MEDICAL RECORD NO.:  [id number]  LOCATION:                                 FACILITY:  PHYSICIAN:  Kingsley Callander. [name], MD       DATE OF BIRTH:  1922-11-13  DATE OF ADMISSION:  06/08/2011 DATE OF DISCHARGE:  LH                             HISTORY & PHYSICAL   CHIEF COMPLAINT:  Vomiting.  HISTORY OF PRESENT ILLNESS:  This patient is an 77 year old white female with Parkinson's disease who presented to the emergency room with a 1- day history of vomiting.  She had been vomiting up black material. There has been no evidence of bright red blood.  She has had bowel movements recently without blood or melena.  The patient does not use NSAIDs or alcohol.  She has recently returned home from a stay at [facility].  She has a history of gastroesophageal reflux and has been on Prilosec.  She underwent evaluation in the emergency room last night which revealed a distended stomach on CT scan.  There was also a question of pulmonary embolus in her right lower lobe.  The patient denies abdominal pain or fever.  PAST MEDICAL HISTORY: 1. Parkinson's disease. 2. GERD. 3. Hypertension. 4. Diabetes. 5. Anxiety. 6. Osteoporosis. 7. Hyperlipidemia. 8. Hysterectomy. 9. Right breast lumpectomy. 10.Cataract surgery. 11.Foot surgery.  MEDICATIONS: 1. Paxil 10 mg daily. 2. Remeron 15 mg at bedtime. 3. Stalevo 25/100/200 t.i.d. 4. Lisinopril 40 mg daily. 5. HCTZ 25 mg daily. 6. Tylenol 650 mg q.4 h. P.r.n. 7. Prilosec 20 mg daily. 8. Amitriptyline 10 mg at bedtime.  ALLERGIES:  None.  SOCIAL HISTORY:  She does not smoke, drink, or use drugs.  She is cared for at home by her family.  FAMILY HISTORY:  Her father had a stroke.  Brothers had coronary artery disease.  Her mother had hypertension and died at 36.  REVIEW OF SYSTEMS:  No chest pain, shortness of breath, or fever.  She does note difficulty voiding over the past 2 days.  She has  had incontinence.  PHYSICAL EXAMINATION:  VITAL SIGNS:  Temperature 98.6, pulse 102, respirations 16, blood pressure 103/71. GENERAL:  Alert but weak appearing. HEENT:  No scleral icterus.  The pharynx reveals a black coating on the tongue. NECK:  Reveals no JVD, thyromegaly, or lymphadenopathy. LUNGS:  Clear. HEART:  Regular with no murmurs. ABDOMEN:  Mildly distended, nontender with no hepatosplenomegaly. EXTREMITIES:  No cyanosis, clubbing, or edema. NEURO:  Reveals no focal weakness.  She is mildly confused.  She has some cogwheeling of her upper extremities.  Back reveals kyphoscoliosis. Lymph nodes revealed no enlargement.  LABORATORY DATA:  White count 27.6, hemoglobin 13.6, platelets 296,000. Sodium 132, potassium 5.0, bicarb 18, BUN 29, creatinine 1.24, calcium 10.0, INR 1.0.  A CT scan of the abdomen and pelvis reveals prominent fluid distention of the stomach and duodenum to the level of the SMA and intrinsic mass could not be excluded.  There was a large esophageal hiatal hernia.  Bilateral pleural effusions were present with bibasilar atelectasis, a nonspecific nodular opacity measuring 8 mm was noted in the left lung base.  There was a filling defect in the right lower lung vessel and evaluation for pulmonary embolus was advised by Radiology. SGOT 14, SGPT less than 5, albumin 3.4.  Urinalysis reveals too numerous to count white cells with a large leukocyte esterase, negative nitrite, and many bacteria.  Blood and urine cultures have been obtained.  IMPRESSION/PLAN: 1. Upper gastrointestinal bleed.  She is vomiting coffee ground type     material.  A nasogastric tube was placed this morning and over 3     liters of black liquid was obtained.  She has remained     hemodynamically stable.  She will be treated with IV Protonix and     IV fluids.  She will be evaluated in gastroenterology consultation     by Dr. Karilyn Cota.  I have discussed her case with him. 2. Possible  pulmonary embolus.  A CT scan of the chest will be     obtained.  She is obviously not a candidate for anticoagulation. 3. Urinary tract infection.  Blood and urine cultures have been     obtained.  She will be treated with IV ceftriaxone. 4. Parkinson's disease.  A do not resuscitate order is in place. 5. Hypertension.  Hold lisinopril and HCTZ. 6. History of diabetes.  Glucoses have been well controlled after     weight loss in recent years. 7. History of gastroesophageal reflux disease. 8. History of osteoporosis. 9. Osteoarthritis and history of right lumbar radiculopathy earlier     this year. 10.Status post colonoscopy in 2004.     Kingsley Callander. [name], MD     ROF/MEDQ  D:  06/09/2011  T:  06/10/2011  Job:  [id number]

## 2011-06-10 NOTE — Op Note (Signed)
EGD PROCEDURE REPORT  PATIENT:  [name]  MR#:  [id number] Birthdate:  April 19, 1923, 77 y.o., female Endoscopist:  Dr. Malissa Hippo, MD Referred By:  Dr. 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. Medications:   Versed 2  mg IV Cetacaine spray topically for oropharyngeal anesthesia  Description of procedure:  The endoscope was introduced through the mouth and advanced to the fourth portion of the duodenum without difficulty or limitations. The mucosal surfaces were surveyed very carefully during advancement of the scope and upon withdrawal.  Findings:  Esophagus:  Multiple superficial ulcers involving esophageal Mucosa without stigmata of bleed. Moderate to large hiatal hernia. GEJ:  29 cm Hiatus:  37 cm Stomach:  Large stomach with scant amount of bile. Antral erythema 2 small ulcers on the lesser with a clot no active bleeding noted; patent pyloric channel;  Duodenum:   Normal bulbar mucosa; dilated second and third portion of duodenum; scope advanced into the fourth part no decision noted; 10 mm submucosal lesion involving third part of the duodenum severe small lipoma  Therapeutic/Diagnostic Maneuvers Performed:  None  Complications:  None.  Impression: Ulcerated reflux esophagitis. Moderate to large sliding hiatal hernia. Dilated stomach with 2 small antral ulcers. Dilated second and third part of the duodenum; scope passed  into the fourth part but no transition noted. Small duodenal lipoma. 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  Recommendations:  Leave NG tube out. Clear liquids. Continue IV PPI. CBC and BMET in a.m.  [name],NAJEEB U  06/10/2011  12:49 PM  CC: Dr.ROY FAGAN MD.

## 2011-06-11 LAB — BASIC METABOLIC PANEL
CO2: 17 mEq/L — ABNORMAL LOW (ref 19–32)
Calcium: 9.9 mg/dL (ref 8.4–10.5)
Chloride: 111 mEq/L (ref 96–112)
Creatinine, Ser: 1.02 mg/dL (ref 0.50–1.10)
GFR calc Af Amer: >60 mL/min (ref >60)
Sodium: 142 mEq/L (ref 135–145)

## 2011-06-11 LAB — CBC
MCV: 94.6 fL (ref 78.0–100.0)
Platelets: 225 10*3/uL (ref 150–400)
RBC: 2.76 MIL/uL — ABNORMAL LOW (ref 3.87–5.11)
RDW: 16.2 % — ABNORMAL HIGH (ref 11.5–15.5)
WBC: 13 10*3/uL — ABNORMAL HIGH (ref 4.0–10.5)

## 2011-06-11 LAB — URINE CULTURE
Colony Count: >=100000
Culture  Setup Time: 201208150305

## 2011-06-11 MED ORDER — DEXTROSE 5 % IV SOLN
INTRAVENOUS | Status: AC
  Filled 2011-06-11: qty 1

## 2011-06-11 MED ORDER — HYDROCODONE-ACETAMINOPHEN 5-325 MG PO TABS
1.0000 | ORAL_TABLET | Freq: Four times a day (QID) | ORAL | Status: DC | PRN
  Administered 2011-06-11 – 2011-06-17 (×7): 1 via ORAL
  Filled 2011-06-11 (×7): qty 1

## 2011-06-11 NOTE — Progress Notes (Signed)
Patient has been tolerating sips of clear liquids; She denies heartburn nausea vomiting or abdominal pain ; Complains of knee and shoulder pain. She. still on nonrebreathing mask. 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 Respiratory problems secondary to pulmonary embolism; status post IVC filter placement 2 days ago. Recommendations; CBC and BMET in am. Type and Screen. Ful  liquids starting in a.m.

## 2011-06-11 NOTE — Progress Notes (Signed)
Subjective: Since I last evaluated the patient Resting quietly. Non-rebreather in place for recent hx of pulmonary emboli and hypoxia.Marland Kitchen  Hemoglobin has dropped to 8.5 from 9.6 yesterday.  BUN and Creatinine are better today.  NG tube is out. Clear liquid diet which she has not eaten. Objective: Vital signs in last 24 hours: Temp:  [97.6 F (36.4 C)-98.4 F (36.9 C)] 97.6 F (36.4 C) (08/17 0600) Pulse Rate:  [63-118] 91  (08/17 0600) Resp:  [19-27] 20  (08/17 0600) BP: (82-124)/(32-74) 124/74 mmHg (08/17 0600) SpO2:  [58 %-100 %] 100 % (08/17 0600) FiO2 (%):  [94 %-100 %] 94 % (08/16 2200) Last BM Date: 06/08/11  Intake/Output from previous day: 08/16 0701 - 08/17 0700 In: 1218.6 [P.O.:240; I.V.:978.6] Out: -  Intake/Output this shift:    Resp: clear to auscultation bilaterallyResting quietly. Non rebreather in placer.  Lung clear.    Lab Results:  Basename 06/11/11 0623 06/10/11 0545 06/08/11 2055  WBC 13.0* 16.6* 27.6*  HGB 8.5* 9.6* 13.6  HCT 26.1* 29.0* 40.8  PLT 225 240 296   BMET  Basename 06/11/11 0623 06/10/11 0545 06/08/11 2055  NA 142 138 132*  K 4.6 5.1 5.0  CL 111 108 98  CO2 17* 17* 18*  GLUCOSE 77 107* 198*  BUN 29* 41* 29*  CREATININE 1.02 1.63* 1.24*  CALCIUM 9.9 9.6 10.0   LFT  Basename 06/08/11 2144  PROT 8.5*  ALBUMIN 3.4*  AST 14  ALT <5  ALKPHOS 86  BILITOT 0.4  BILIDIR 0.1  IBILI 0.3   PT/INR  Basename 06/09/11 1125  LABPROT 14.4  INR 1.10   Hepatitis Panel No results found for this basename: HEPBSAG,HCVAB,HEPAIGM,HEPBIGM in the last 72 hours C-Diff No results found for this basename: CDIFFTOX:3 in the last 72 hours Fecal Lactopherrin No results found for this basename: FECLLACTOFRN in the last 72 hours  Studies/Results: Ct Angio Chest W/cm &/or Wo Cm  06/09/2011  *RADIOLOGY REPORT*  Clinical Data:  Shortness of breath, abnormal CT abdomen with question pulmonary embolus at inferior right hilum, coffe ground emesis,  hypertension, GERD, Parkinson disease  CT ANGIOGRAPHY CHEST WITH CONTRAST  Technique:  Multidetector CT imaging of the chest was performed using the standard protocol during bolus administration of intravenous contrast.  Multiplanar CT image reconstructions including MIPs were obtained to evaluate the vascular anatomy.  Contrast:  100 ml Omnipaque 300 IV.  Comparison:  None Correlation:  CT abdomen 06/08/2011  Findings: Nasogastric tube extends into stomach. Atherosclerotic calcification aorta and coronary arteries.  Large pulmonary embolus identified in descending interlobar right pulmonary artery extending primarily into the right lower lobe pulmonary arterial branches but into proximal right middle lobar artery as well. No additional pulmonary emboli identified. Very large hiatal hernia, stomach filled with fluid despite NG tube. Mild distention of distal thoracic esophagus with questionable wall thickening.  No thoracic adenopathy. Tiny bilateral pleural effusions. Visualized portion of liver and spleen unremarkable. Atelectasis bilateral lower lobes greater on the right. Minimal subsegmental atelectasis at right middle lobe. Remaining lungs grossly clear. Bones appear diffusely demineralized with multilevel endplate spur formation thoracic spine.  Review of the MIP images confirms the above findings.  IMPRESSION: Large pulmonary embolus in the right lower lobe pulmonary artery extending into proximal right middle lobe pulmonary artery. Very large hiatal hernia filled with fluid. Mildly distended distal thoracic esophagus, with questionable wall thickening. Bibasilar atelectasis and tiny pleural effusions.  Critical Value/emergent results were called by telephone at the time of interpretation on  06/09/2011  at 1025 hours  to  Dr. [name], who verbally acknowledged these results.  Original Report Authenticated By: Lollie Marrow, M.D.   Ir Ivc Filter Placement  06/09/2011  *RADIOLOGY REPORT*  Clinical  Data/Indication:  GASTROINTESTINAL BLEEDING.  PULMONARY THROMBOEMBOLISM.  RADIOLOGY EXAMINATION  Sedation: Versed 0.5 mg, Fentanyl 12.5 mcg.  Total Moderate Sedation Time: None minutes.  Contrast Volume: 50 ml Omnipaque-300.  Additional Medications: None.  Fluoroscopy Time: 1.3 minutes.  Procedure: The procedure, risks, benefits, and alternatives were explained to the patient. Questions regarding the procedure were encouraged and answered. The patient understands and consents to the procedure.  The right neck was prepped with Betadine in a sterile fashion, and a sterile drape was applied covering the operative field. A sterile gown and sterile gloves were used for the procedure.  The right internal jugular vein was noted to be patent initially with ultrasound.  Under sonographic guidance, a micropuncture needle was inserted into the right internal jugular vein (Ultrasound image documentation was performed). It was removed over an 018 wire which was upsized to a [place].  The sheath was inserted over the wire and into the IVC.  IVC venography was performed.  The temporary filter was then deployed in the infrarenal IVC.  The sheath was removed and hemostasis was achieved with direct pressure.  Findings: Central venography demonstrates patency of the IVC and no venous anomaly.  The image demonstrates placement of an IVC filter with its tip at the mid L2 vertebral body.  Complications: None  IMPRESSION: Successful infrarenal IVC filter placement.  This is a temporary filter.  It can be removed or remain in place to become permanent.  Original Report Authenticated By: Donavan Burnet, M.D.   Dg Chest Portable 1 View  06/10/2011  *RADIOLOGY REPORT*  Clinical Data: Hypoxia, pulmonary embolism  PORTABLE CHEST - 1 VIEW  Comparison: Portable exam 1348 hours compared to 01/14/2011  Findings: Minimal enlargement of cardiac silhouette. Pulmonary vascularity normal. Very large hiatal hernia. Bibasilar atelectasis. Upper lungs  clear. Small right pleural effusion. No pneumothorax. Scattered clothing/bedding artifacts. Linear lucency parallels the left border of the mediastinum, question related to bronchial wall thickening, with no additional signs to suggest pneumomediastinum.  IMPRESSION: Very large hiatal hernia. Bibasilar atelectasis, increased since previous exam. Enlargement of cardiac silhouette.  Original Report Authenticated By: Lollie Marrow, M.D.    Medications: I have reviewed the patient's current medications.  Assessment/Plan:  We will continue to monitor.  LOS: 3 days   [name] W 06/11/2011, 8:47 AM

## 2011-06-11 NOTE — Progress Notes (Signed)
Notified Dr. Juanetta Gosling due to pt continuing to yell out due to pain at the back of her neck, with no relief of PRN Tylenol order.  Voiced to him the pt only takes Tylenol at home.  MD states he will put something in.

## 2011-06-11 NOTE — Progress Notes (Signed)
Subjective: She complains of a headache today. She is not having any abdominal discomfort now. She remains on high flow O2. She denies any chest pain and says she is not short of breath.  Objective: Vital signs in last 24 hours: Temp:  [97.6 F (36.4 C)-98.3 F (36.8 C)] 98.2 F (36.8 C) (08/17 1414) Pulse Rate:  [63-98] 98  (08/17 1414) Resp:  [20-24] 20  (08/17 1414) BP: (107-145)/(70-80) 145/80 mmHg (08/17 1414) SpO2:  [94 %-100 %] 100 % (08/17 1414) FiO2 (%):  [94 %] 94 % (08/17 1414) Weight change:  Last BM Date: 06/08/11  Intake/Output from previous day: 08/16 0701 - 08/17 0700 In: 1218.6 [P.O.:240; I.V.:978.6] Out: -   PHYSICAL EXAM General appearance: alert, cooperative and no distress Resp: diminished breath sounds bilaterally Cardio: regular rate and rhythm, S1, S2 normal, no murmur, click, rub or gallop GI: soft, non-tender; bowel sounds normal; no masses,  no organomegaly Extremities: extremities normal, atraumatic, no cyanosis or edema  Lab Results:    Basic Metabolic Panel:  Basename 06/11/11 0623 06/10/11 0545  NA 142 138  K 4.6 5.1  CL 111 108  CO2 17* 17*  GLUCOSE 77 107*  BUN 29* 41*  CREATININE 1.02 1.63*  CALCIUM 9.9 9.6  MG -- --  PHOS -- --   Liver Function Tests:  [facility] 06/08/11 2144  AST 14  ALT <5  ALKPHOS 86  BILITOT 0.4  PROT 8.5*  ALBUMIN 3.4*    Basename 06/08/11 2144  LIPASE 48  AMYLASE --   CBC:  Basename 06/11/11 0623 06/10/11 0545 06/08/11 2055  WBC 13.0* 16.6* --  NEUTROABS -- -- 24.5*  HGB 8.5* 9.6* --  HCT 26.1* 29.0* --  MCV 94.6 92.9 --  PLT 225 240 --   Cardiac Enzymes: No results found for this basename: CKTOTAL:3,CKMB:3,CKMBINDEX:3,TROPONINI:3 in the last 72 hours BNP: No results found for this basename: POCBNP:3 in the last 72 hours D-Dimer: No results found for this basename: DDIMER:2 in the last 72 hours CBG: No results found for this basename: GLUCAP:6 in the last 72 hours Hemoglobin  A1C: No results found for this basename: HGBA1C in the last 72 hours Fasting Lipid Panel: No results found for this basename: CHOL,HDL,LDLCALC,TRIG,CHOLHDL,LDLDIRECT in the last 72 hours Thyroid Function Tests: No results found for this basename: TSH,T4TOTAL,FREET4,T3FREE,THYROIDAB in the last 72 hours Anemia Panel: No results found for this basename: VITAMINB12,FOLATE,FERRITIN,TIBC,IRON,RETICCTPCT in the last 72 hours Urine Drug Screen:  Urinalysis:  Misc. Labs:  ABGS No results found for this basename: PHART,PCO2,PO2ART,TCO2,HCO3 in the last 72 hours CULTURES Recent Results (from the past 240 hour(s))  CULTURE, BLOOD (ROUTINE SINGLE)     Status: Normal (Preliminary result)   Collection Time   06/08/11  9:44 PM      Component Value Range Status Comment   Specimen Description LEFT ANTECUBITAL DRAWN BY RN   Final    Special Requests BOTTLES DRAWN AEROBIC AND ANAEROBIC 5CC EACH   Final    Culture NO GROWTH 3 DAYS   Final    Report Status PENDING   Incomplete   URINE CULTURE     Status: Normal   Collection Time   06/08/11 10:44 PM      Component Value Range Status Comment   Specimen Description URINE, CATHETERIZED   Final    Special Requests NONE   Final    Setup Time [id number]   Final    Colony Count >=100,000 COLONIES/ML   Final    Culture  Final    Value: GROUP B STREP(S.AGALACTIAE)ISOLATED     Note: TESTING AGAINST S. AGALACTIAE NOT ROUTINELY PERFORMED DUE TO PREDICTABILITY OF AMP/PEN/VAN SUSCEPTIBILITY.   Report Status 06/11/2011 FINAL   Final    Studies/Results: Dg Chest Portable 1 View  06/10/2011  *RADIOLOGY REPORT*  Clinical Data: Hypoxia, pulmonary embolism  PORTABLE CHEST - 1 VIEW  Comparison: Portable exam 1348 hours compared to 01/14/2011  Findings: Minimal enlargement of cardiac silhouette. Pulmonary vascularity normal. Very large hiatal hernia. Bibasilar atelectasis. Upper lungs clear. Small right pleural effusion. No pneumothorax. Scattered clothing/bedding  artifacts. Linear lucency parallels the left border of the mediastinum, question related to bronchial wall thickening, with no additional signs to suggest pneumomediastinum.  IMPRESSION: Very large hiatal hernia. Bibasilar atelectasis, increased since previous exam. Enlargement of cardiac silhouette.  Original Report Authenticated By: Lollie Marrow, M.D.    Medications:  Scheduled:   . cefTRIAXone (ROCEPHIN) IV  1 g Intravenous Q24H  . midazolam      . ondansetron  4 mg Intravenous Once  . pantoprazole (PROTONIX) IV  40 mg Intravenous Once  . polyvinyl alcohol  1 drop Both Eyes Daily   Continuous:   . sodium chloride 50 mL/hr at 06/11/11 0500   ZOX:[id number], dextromethorphan, HYDROcodone-acetaminophen, phenol  Assesment:she has had a GI bleed. This is complicated by the fact she also had pulmonary embolus. She had inferior vena cava filter placed. She is complaining of headache. Active Problems:  * No active hospital problems. *     Plan:she will start on some Vicodin for pain continue all of her other treatments.    LOS: 3 days   [place] L 06/11/2011, 7:21 PM

## 2011-06-12 LAB — CBC
HCT: 24.1 % — ABNORMAL LOW (ref 36.0–46.0)
MCH: 30.1 pg (ref 26.0–34.0)
MCHC: 32.4 g/dL (ref 30.0–36.0)
MCV: 93.1 fL (ref 78.0–100.0)
Platelets: 244 10*3/uL (ref 150–400)
RDW: 15.4 % (ref 11.5–15.5)

## 2011-06-12 LAB — BASIC METABOLIC PANEL
BUN: 18 mg/dL (ref 6–23)
Calcium: 10.2 mg/dL (ref 8.4–10.5)
Creatinine, Ser: 0.77 mg/dL (ref 0.50–1.10)
GFR calc Af Amer: >60 mL/min (ref >60)
GFR calc non Af Amer: >60 mL/min (ref >60)

## 2011-06-12 LAB — PREPARE RBC (CROSSMATCH)

## 2011-06-12 MED ORDER — SODIUM CHLORIDE 0.9 % IJ SOLN
INTRAMUSCULAR | Status: AC
  Administered 2011-06-12: 10 mL
  Filled 2011-06-12: qty 10

## 2011-06-12 MED ORDER — PAROXETINE MESYLATE 10 MG PO TABS: 10.0000 mg | ORAL_TABLET | ORAL | Status: DC

## 2011-06-12 MED ORDER — CARBIDOPA-LEVODOPA 25-100 MG PO TABS
1.0000 | ORAL_TABLET | Freq: Three times a day (TID) | ORAL | Status: DC
  Administered 2011-06-12 – 2011-06-13 (×5): 1 via ORAL
  Filled 2011-06-12 (×7): qty 1

## 2011-06-12 MED ORDER — ENTACAPONE 200 MG PO TABS
200.0000 mg | ORAL_TABLET | Freq: Three times a day (TID) | ORAL | Status: DC
  Administered 2011-06-12 – 2011-06-13 (×4): 200 mg via ORAL
  Filled 2011-06-12 (×7): qty 1

## 2011-06-12 MED ORDER — MIRTAZAPINE 30 MG PO TABS
15.0000 mg | ORAL_TABLET | Freq: Every day | ORAL | Status: DC
  Administered 2011-06-12 – 2011-06-17 (×6): 15 mg via ORAL
  Filled 2011-06-12 (×6): qty 1

## 2011-06-12 MED ORDER — CARBIDOPA-LEVODOPA-ENTACAPONE 25-100-200 MG PO TABS: 1.0000 | ORAL_TABLET | Freq: Three times a day (TID) | ORAL | Status: DC

## 2011-06-12 MED ORDER — AMITRIPTYLINE HCL 10 MG PO TABS
10.0000 mg | ORAL_TABLET | Freq: Every day | ORAL | Status: DC
  Administered 2011-06-12 – 2011-06-17 (×6): 10 mg via ORAL
  Filled 2011-06-12 (×6): qty 1

## 2011-06-12 MED ORDER — CARBIDOPA-LEVODOPA 25-100 MG PO TABS
ORAL_TABLET | ORAL | Status: AC
  Filled 2011-06-12: qty 1

## 2011-06-12 NOTE — Progress Notes (Signed)
Skin Assessment:  Pt has a tear between her buttocks proximal to the sacrum.  The area was kept clean and dry and restore/barrier cream was placed to the site with changing.

## 2011-06-12 NOTE — Progress Notes (Signed)
Subjective; patient denies abdominal pain, nausea chest pain or dysphagia; even though her diet was changed to full liquids she  is still on clear liquids; according to her daughter she did regurgitate scan amount of food at breakfast; she does this at home as well; his morning she had loose watery stools; no melena reported. Objective. BP 151/79  Pulse 95  Temp(Src) 98.6 F (37 C) (Oral)  Resp 20  Ht 4\' 8"  (1.422 m)  Wt 120 lb (54.432 kg)  BMI 26.90 kg/m2  SpO2 100% Patient is up in a recliner and appears to be comfortable. She is now on nasal O2. She appears pale. Abdomen is symmetrical bowel sounds are normal, no succussion splash noted over epigastric area. No tenderness either. Lab data; WBC 10.1 hemoglobin 7.8 hematocrit 24.1 Serum sodium 137, potassium 3.9, chloride 106, CO2 22, glucose 92, BUN 18 and creatinine 0.77. Assessment; 1. 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 2. anemia  secondary to upper GI bleed; recent EGD revealed esophagitis and gastric ulcers. No evidence of active bleeding. 3. pulmonary embolism that is post IVC filter placement as patient not a candidate for anti-coagulation. 4. urinary tract infection being treated the IV antibiotic. Recommendations; Full liquids Agree with plans were to give her 2 units of PRBCs.

## 2011-06-12 NOTE — Progress Notes (Signed)
NAMEADREANNE, YONO                 ACCOUNT NO.:  [id number]  MEDICAL RECORD NO.:  [id number]  LOCATION:  A338                          FACILITY:  [facility]  PHYSICIAN:  Kingsley Callander. [name], MD       DATE OF BIRTH:  May 18, 1923  DATE OF PROCEDURE:  06/12/2011 DATE OF DISCHARGE:                                PROGRESS NOTE   Ms. Cavanaugh is feeling better this morning and she has had no vomiting. She has been able to tolerate full liquids.  VITAL SIGNS:  Temperature is 98.6 with pulse of 95, respirations 20, and blood pressure of 151/79.  Her oxygen saturation is 100%. LUNGS:  Clear. HEART:  Regular with no murmurs. ABDOMEN:  Soft and nontender. NEUROLOGIC:  Neurologic status is stable.  IMPRESSION/PLAN: 1. Superior mesenteric artery syndrome.  She is improving.  Continue     full liquids until advanced by Gastroenterology.  Her white count     is improved from 13-10.1. 2. Anemia.  Her hemoglobin has continued to drift.  She is now down to     7.8.  She will be transfused with 2 units of packed red cells     today. 3. Pulmonary embolus, status post [place]field filter placement.     Oxygenation has definitely improved.  Her oxygen will be decreased     to 2 liters. 4. Renal insufficiency.  Her BUN and creatinine have normalized 18 and     0.77. 5. Streptococcus urinary tract infection.  Continue ceftriaxone. 6. Parkinson disease.  Restart Stalevo.     Kingsley Callander. [name], MD     ROF/MEDQ  D:  06/12/2011  T:  06/12/2011  Job:  [id number]

## 2011-06-13 ENCOUNTER — Inpatient Hospital Stay (HOSPITAL_COMMUNITY): Payer: Medicare Other

## 2011-06-13 LAB — CULTURE, BLOOD (SINGLE): Culture: NO GROWTH

## 2011-06-13 LAB — TYPE AND SCREEN
ABO/RH(D): B POS
Antibody Screen: NEGATIVE
Unit division: 0

## 2011-06-13 LAB — CBC
Hemoglobin: 10.8 g/dL — ABNORMAL LOW (ref 12.0–15.0)
MCH: 31 pg (ref 26.0–34.0)
MCHC: 35 g/dL (ref 30.0–36.0)
Platelets: 216 10*3/uL (ref 150–400)
RDW: 15.2 % (ref 11.5–15.5)

## 2011-06-13 MED ORDER — CARBIDOPA-LEVODOPA 25-100 MG PO TABS
2.0000 | ORAL_TABLET | Freq: Three times a day (TID) | ORAL | Status: DC
  Administered 2011-06-13 – 2011-06-18 (×15): 2 via ORAL
  Filled 2011-06-13 (×24): qty 2

## 2011-06-13 MED ORDER — ENTACAPONE 200 MG PO TABS
200.0000 mg | ORAL_TABLET | Freq: Three times a day (TID) | ORAL | Status: DC
  Administered 2011-06-13 – 2011-06-18 (×14): 200 mg via ORAL
  Filled 2011-06-13 (×20): qty 1

## 2011-06-13 MED ORDER — DOCUSATE SODIUM 50 MG/5ML PO LIQD: 100.0000 mg | Freq: Every day | ORAL | Status: DC

## 2011-06-13 MED ORDER — DOCUSATE SODIUM 100 MG PO CAPS: 100.0000 mg | ORAL_CAPSULE | Freq: Every day | ORAL | Status: DC

## 2011-06-13 NOTE — Progress Notes (Signed)
Subjective; patient denies heartburn, chest pain or abdominal pain; she is eating some of her meals no vomiting reported; no diarrhea today; patient has been confused talking to her daughter; Objective; BP 143/80  Pulse 86  Temp(Src) 98.7 F (37.1 C) (Oral)  Resp 20  Ht 4\' 8"  (1.422 m)  Wt 120 lb (54.432 kg)  BMI 26.90 kg/m2  SpO2 98% Patient is in a recliner appears to be comfortable; she responds appropriately to questions Lungs are clear anteriorly. Abdomen symmetrical with loud bowel sounds; soft and nontender; no succussion splash noted at epigastrium. No peripheral edema. Lab data; WBC 9.7, hemoglobin 10.8, hematocrit 30.9 Assessment; 1. upper GI bleed inactive; H&H up after transfusion with 2 units of PRBC. 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 3. UTI being treated with ceftriaxone. 4.  Hospital psychosis. Other problems include pulmonary embolism status post IVC filter placement and Parkinson's disease. Recommendations; Advance diet; Plain abdominal film in a.m. H. and H. in a.m. H. pylori serology.

## 2011-06-13 NOTE — Progress Notes (Signed)
NAMEANJALI, MANZELLA                 ACCOUNT NO.:  [id number]  MEDICAL RECORD NO.:  [id number]  LOCATION:  A338                          FACILITY:  [facility]  PHYSICIAN:  Kingsley Callander. [name], MD       DATE OF BIRTH:  [birth year]  DATE OF PROCEDURE:  06/13/2011 DATE OF DISCHARGE:                                PROGRESS NOTE   Ms. Bessire has eaten her breakfast without difficulty this morning.  She has been relatively comfortable and denies any recurrent vomiting. Nursing staff has noted no bowel movements; however, she states she has had a bowel movement.  She wants to try taking the apple juice and not taking a laxative.  She has tolerated transfusions well.  PHYSICAL EXAMINATION:  VITAL SIGNS:  Temperature is 98.7, pulse 86, respirations 20, blood pressure 143/80, oxygen saturation 98%. LUNGS:  Clear. HEART:  Regular with no murmurs. ABDOMEN:  Soft and nontender. NEUROLOGIC:  Stable.  IMPRESSION/PLAN: 1. Superior mesenteric artery syndrome.  Again, she is not     experiencing pain or vomiting now.  Her hemoglobin is up to 10.8     after 2 units. 2. Pulmonary embolus status post Greenfield filter placement.  She is     oxygenating well. 3. History of hypertension. 4. Parkinson disease. 5. Recent renal insufficiency, resolved. 6. Urinary tract infection.  Continue ceftriaxone.     Kingsley Callander. [name], MD     ROF/MEDQ  D:  06/13/2011  T:  06/13/2011  Job:  [id number]

## 2011-06-14 ENCOUNTER — Inpatient Hospital Stay (HOSPITAL_COMMUNITY): Payer: Medicare Other

## 2011-06-14 LAB — H. PYLORI ANTIBODY, IGG: H Pylori IgG: 0.87 {ISR}

## 2011-06-14 LAB — HEMOGLOBIN AND HEMATOCRIT, BLOOD: HCT: 36 % (ref 36.0–46.0)

## 2011-06-14 MED ORDER — SODIUM CHLORIDE 0.9 % IJ SOLN
INTRAMUSCULAR | Status: AC
  Administered 2011-06-14: 3 mL
  Filled 2011-06-14: qty 3

## 2011-06-14 MED ORDER — PANTOPRAZOLE SODIUM 40 MG PO TBEC
40.0000 mg | DELAYED_RELEASE_TABLET | Freq: Two times a day (BID) | ORAL | Status: DC
  Administered 2011-06-14 – 2011-06-18 (×8): 40 mg via ORAL
  Filled 2011-06-14 (×9): qty 1

## 2011-06-14 NOTE — Progress Notes (Signed)
Subjective; patient denies nausea or vomiting; she states she had some pain in epigastric region earlier but not now. No BM in last 24 hours. Objective;  BP 150/78  Pulse 108  Temp(Src) 98.1 F (36.7 C) (Oral)  Resp 20  Ht 4\' 8"  (1.422 m)  Wt 120 lb (54.432 kg)  BMI 26.90 kg/m2  SpO2 88% Patient is alert and responds appropriately to simple questions; please note patient has been confused intermittently. Abdomen with normal bowel sounds; mild tenderness at epigastrium the abdomen is not distended. Lab data; Hemoglobin is 12.3 hematocrit 36.0. H. pylori serology is pending. Abdominal film did not cover the upper part no abnormality noted. Assessment; Patient appears to be doing well from GI standpoint; Her H&H is up; no evidence of active bleeding Tolerating oral feeding however intake not good; Recommendations;  Will change pantoprazole to oral route would leave her on twice a day schedule for least 8 weeks and then once a day

## 2011-06-14 NOTE — Progress Notes (Signed)
[name], [name]                 ACCOUNT NO.:  [id number]  MEDICAL RECORD NO.:  [id number]  LOCATION:  XRAY                         FACILITY:  [facility]  PHYSICIAN:  Kingsley Callander. [name], MD       DATE OF BIRTH:  12-Mar-1923  DATE OF PROCEDURE:  06/14/2011 DATE OF DISCHARGE:  06/09/2011                                PROGRESS NOTE   Ms. Preisler is confused this morning.  She has had bowel movements.  She denies abdominal pain.  She has been able to eat.  VITAL SIGNS:  Her temperature is 98.1, pulse of 108, respirations 20, blood pressure 150/78, and oxygen saturation of 88%.  Oxygen saturation has been up in the 97 range.  She is currently off oxygen. LUNGS:  Clear. HEART:  Regular. ABDOMEN:  Soft and nontender. EXTREMITIES:  No edema. NEURO:  She tells me she is at her daughter's house, but she does know who I am.  IMPRESSION/PLAN: 1. Superior mesenteric artery syndrome, improved.  She will have x-     rays of the abdomen today. 2. Gastric ulcers and esophagitis, continue PPI therapy. 3. Anemia.  Hemoglobin is 12.3 today. 4. Pulmonary embolus, status post filter placement.  Continue     supplemental oxygen. 5. Urinary tract infection.  Continue ceftriaxone. 6. Parkinson disease, stable.     Kingsley Callander. [name], MD     ROF/MEDQ  D:  06/14/2011  T:  06/14/2011  Job:  [id number]

## 2011-06-14 NOTE — Progress Notes (Signed)
Discussed with Dr. [name] pts POC.  Suggested advancing diet from the full liquid current diet.  New orders given and followed.

## 2011-06-15 ENCOUNTER — Inpatient Hospital Stay (HOSPITAL_COMMUNITY): Payer: Medicare Other

## 2011-06-15 MED ORDER — SODIUM CHLORIDE 0.9 % IJ SOLN
INTRAMUSCULAR | Status: AC
  Administered 2011-06-15: 01:00:00
  Filled 2011-06-15: qty 10

## 2011-06-15 NOTE — Progress Notes (Signed)
NAMEREN, GRASSE                 ACCOUNT NO.:  [id number]  MEDICAL RECORD NO.:  [id number]  LOCATION:  XRAY                         FACILITY:  [facility]  PHYSICIAN:  Kingsley Callander. [name], MD       DATE OF BIRTH:  08-20-1923  DATE OF PROCEDURE: DATE OF DISCHARGE:  06/09/2011                                PROGRESS NOTE   Ms. Mihelich is not experiencing vomiting or abdominal pain now.  She is deconditioned further after her hospital stay and her family is concerned that she may not be able to transfer as well at home. Physical therapy has therefore been consulted and this will be discussed with the case manager.  PHYSICAL EXAMINATION:  VITAL SIGNS:  Stable with a temperature of 97.6, pulse 87, respirations 20, blood pressure 124/41, oxygen saturation is 92% on supplemental oxygen at 2 liters. LUNGS:  Clear. HEART:  Regular. ABDOMEN:  Soft and nontender. EXTREMITIES:  Reveal no edema.  IMPRESSION/PLAN: 1. Superior mesenteric artery syndrome improved. 2. Pulmonary embolus status post IVC filter placement.  Continue     supplemental oxygen. 3. Parkinson's disease with deconditioning.  Consult Physical Therapy. 4. Anemia, resolved.     Kingsley Callander. [name], MD     ROF/MEDQ  D:  06/15/2011  T:  06/15/2011  Job:  [id number]

## 2011-06-15 NOTE — Progress Notes (Signed)
Attempted to do PT eval--totally unable to awaken pt.  RN states that pt has not had any medication that would cause this drowsiness.  I attempted to awaken her with several methods including a cold washcloth to face.  The most she would do is push me away.  Will try again tomorow

## 2011-06-16 ENCOUNTER — Encounter (HOSPITAL_COMMUNITY): Payer: Self-pay | Admitting: Internal Medicine

## 2011-06-16 NOTE — Progress Notes (Signed)
Physical Therapy Evaluation Patient Name: [name] [id number] Date: 06/16/2011 Problem List:  Patient Active Problem List  Diagnoses  . HYPOTENSION  . GERD  . DYSPHAGIA   Past Medical History:  Past Medical History  Diagnosis Date  . Hypertension   . GERD (gastroesophageal reflux disease)   . Parkinson disease   . Depression    Past Surgical History: History reviewed. No pertinent past surgical history.  Precautions/Restrictions  Precautions Precautions: Fall Required Braces or Orthoses: No Restrictions Weight Bearing Restrictions: No Prior Functioning  Home Living Type of Home: House Lives With: Daughter Receives Help From: Family;Personal care attendant Home Layout: One level Home Access: Ramped entrance Bathroom Shower/Tub: Engineer, manufacturing systems: Standard Bathroom Accessibility: Yes How Accessible: Accessible via walker Home Adaptive Equipment: Walker - rolling;Bedside commode/3-in-1 Prior Function Level of Independence: Needs assistance with ADLs;Needs assistance with tranfers;Needs assistance with gait Driving: No Vocation: Retired Producer, television/film/video: Administrator, Civil Service (but is very cooperative-has a hard time staying awakle) Overall Cognitive Status: Appears within functional limits for tasks assessed Orientation Level: Oriented X4 Sensation/Coordination Sensation Light Touch: Appears Intact Extremity Assessment RUE Assessment RUE Assessment: Exceptions to [facility] RUE Strength RUE Overall Strength: Deficits (2+/5) LUE Assessment LUE Assessment: Exceptions to [facility] (2+/5) LUE Strength LUE Overall Strength: Deficits RLE Assessment RLE Assessment: Exceptions to [facility] RLE Strength RLE Overall Strength: Deficits (2/5) LLE Assessment LLE Assessment: Exceptions to [facility] LLE Strength LLE Overall Strength: Deficits (2/5) Mobility (including Balance) Bed Mobility Bed Mobility: Yes Supine to Sit: 2: Max assist;HOB elevated (Comment degrees)  (HOB at 45 degrees) Sitting - Scoot to Edge of Bed: 2: Max assist Transfers Transfers: Yes Stand Pivot Transfers: 2: Max assist Ambulation/Gait Ambulation/Gait: No Stairs: No Wheelchair Mobility Wheelchair Mobility: No  Posture/Postural Control Posture/Postural Control: Postural limitations Postural Limitations: severe thoracic kyphosis Balance Balance Assessed: Yes Static Sitting Balance Static Sitting - Balance Support: Bilateral upper extremity supported Static Sitting - Level of Assistance: 4: Min assist Exercise     End of Session PT - End of Session Equipment Utilized During Treatment: Gait belt Activity Tolerance: Patient tolerated treatment well Patient left: in chair;with call bell in reach Nurse Communication: Mobility status for transfers General Behavior During Session: [facility] for tasks performed Cognition: [facility] for tasks performed PT Assessment/Plan/Recommendation PT Assessment Clinical Impression Statement: very deconditioned -, pleasant and cooperative female--still very sleepy which might be masking some of her strength-should be able to transition to home with current level of support PT Recommendation/Assessment: Patient will need skilled PT in the acute care venue PT Problem List: Decreased strength;Decreased activity tolerance;Decreased balance;Decreased mobility;Cardiopulmonary status limiting activity Barriers to Discharge: None PT Therapy Diagnosis : Generalized weakness PT Plan PT Frequency: Min 3X/week PT Recommendation Follow Up Recommendations: Home health PT Equipment Recommended: Defer to next venue PT Goals  Acute Rehab PT Goals PT Goal Formulation: With patient Pt will go Sit to Supine/Side: with mod assist Pt will Transfer Sit to Stand/Stand to Sit: with mod assist Pt will Transfer Bed to Chair/Chair to Bed: with mod assist Myrlene Broker L 06/16/2011, 9:30 AM

## 2011-06-16 NOTE — Progress Notes (Signed)
NAMECRISTABEL, [name]                 ACCOUNT NO.:  [id number]  MEDICAL RECORD NO.:  [id number]  LOCATION:  A338                          FACILITY:  [facility]  PHYSICIAN:  Kingsley Callander. [name], MD       DATE OF BIRTH:  Nov 12, 1922  DATE OF PROCEDURE: DATE OF DISCHARGE:                                PROGRESS NOTE   [name] was having some increased respiratory difficulty at rounds late yesterday afternoon.  She is breathing comfortably this morning.  Chest x-ray reveals likely pulmonary infarct and small pleural effusions. Temperature this morning 97.8 with a pulse of 95, respirations 24, blood pressure 141/96.  Her oxygen saturation is 99% on 2 liters.  LUNGS:  Lungs sounds are clear. HEART:  Heart rate is regular. ABDOMEN:  Nontender. EXTREMITIES:  No edema.  IMPRESSION/PLAN: 1. Pulmonary embolus/pulmonary infarct.  Continue oxygen status post     [place] filter placement. 2. Superior mesenteric artery syndrome.  No recurrent vomiting. 3. Esophagitis and gastric ulcers.  Continue pantoprazole b.i.d. 4. Parkinson disease, stable.  On Sinemet and Comtan. 5. Debilitation.  She is too weak to participate with physical therapy     yesterday.  She is not alert enough either.  She is better today     and hopefully will be able to make some progress with that. 6. Urinary tract infection.  Continue ceftriaxone.  Today is day 7.     Kingsley Callander. [name], MD     ROF/MEDQ  D:  06/16/2011  T:  06/16/2011  Job:  [id number]

## 2011-06-17 MED ORDER — SODIUM CHLORIDE 0.9 % IJ SOLN
INTRAMUSCULAR | Status: AC
  Administered 2011-06-17: 12:00:00
  Filled 2011-06-17: qty 10

## 2011-06-17 NOTE — Progress Notes (Signed)
[name], [name]                 ACCOUNT NO.:  [id number]  MEDICAL RECORD NO.:  [id number]  LOCATION:  A338                          FACILITY:  [facility]  PHYSICIAN:  Kingsley Callander. [name], MD       DATE OF BIRTH:  1923/02/13  DATE OF PROCEDURE: DATE OF DISCHARGE:                                PROGRESS NOTE   [name] required maximum assistance with transferring from bed to chair yesterday according to physical therapy note.  She has been generally weak.  She seems to be breathing comfortably today and is oxygenating reasonably well with an O2 sat of 92%, pulse is 82, with respirations of 20, and a temperature of 98.2.  PHYSICAL EXAMINATION:  GENERAL:  She is alert and comfortable. LUNGS:  Clear. HEART:  Regular. ABDOMEN:  Nontender. NEURO:  Stable.  IMPRESSION/PLAN: 1. Pulmonary embolus, repeat chest x-ray consistent with pulmonary     infarction.  She is on antibiotics.  She is continuing on     supplemental oxygen. 2. Urinary tract infection.  Continue ceftriaxone. 3. Superior mesenteric artery syndrome, stable. 4. Parkinson disease, stable.     Kingsley Callander. [name], MD     ROF/MEDQ  D:  06/17/2011  T:  06/17/2011  Job:  [id number]

## 2011-06-17 NOTE — Progress Notes (Signed)
UR Chart Review Completed  

## 2011-06-17 NOTE — Progress Notes (Signed)
14 french foley cath inserted per MD order, 375 ml clear yellow urine return.  Patient tolerated well.

## 2011-06-18 NOTE — Discharge Summary (Signed)
[name], [name]                 ACCOUNT NO.:  [id number]  MEDICAL RECORD NO.:  [id number]  LOCATION:  A338                          FACILITY:  [facility]  PHYSICIAN:  Kingsley Callander. [name], MD       DATE OF BIRTH:  [name]  DATE OF ADMISSION:  06/08/2011 DATE OF DISCHARGE:  LH                              DISCHARGE SUMMARY   DISCHARGE DIAGNOSES: 1. Superior mesenteric artery syndrome. 2. Pulmonary embolus. 3. Parkinson disease. 4. History of hypertension. 5. Urinary tract infection. 6. Gastric ulcers and esophagitis. 7. Anemia.  DISCHARGE MEDICATIONS: 1. Omeprazole 20 mg daily. 2. Paxil 10 mg daily. 3. Mirtazapine 15 mg daily. 4. Stalevo 25/100/200 t.i.d. 5. Amitriptyline 10 mg daily. 6. Oxygen 2 liters by nasal cannula.  HOSPITAL COURSE:  This patient is an 77 year old female with Parkinson disease who presented to the emergency room with a 1-day history of vomiting.  She had coffee-ground emesis.  She underwent a CT scan in the emergency room which revealed a distended stomach and a questionable pulmonary embolus in the right lower lobe.  On exam, her abdomen was distended, but not significantly tender.  She was tachycardic.  She was afebrile.  Her white count was 27.6 with a hemoglobin of 13.6.  She was mildly hyponatremic at 132.  BUN and creatinine were mildly elevated at 29 and 1.24.  A nasogastric tube was placed and over 3 liters of black fluid was drained.  She was treated with IV Protonix and IV fluids.  She was seen in Gastroenterology consultation by Dr. Karilyn Cota.  Her hemoglobin dropped over the next day to 9.6.  Her BUN and creatinine increased to 41 and 1.63.  She received additional hydration.  She underwent an upper endoscopy which revealed ulcerated reflux esophagitis, a moderate-to- large sliding hiatal hernia, a dilated stomach, and 2 small antral ulcers.  She was felt to have had extrinsic duodenal obstruction from SMA syndrome.  Her NG tube was left out  following the procedure.  She was allowed to take clear liquids and was continued on IV Protonix.  Her hemoglobin dropped further to 7.8 and she was transfused with 2 units of packed red cells.  She underwent a CT scan of the chest which revealed a right lower lobe pulmonary embolus.  Due to her upper GI bleed, she was not a candidate for anticoagulation.  She underwent an inferior vena cava filter placement by interventional radiologist in [place].  She required supplemental oxygen throughout her hospitalization.  Her dehydration resolved with improvement in her BUN and creatinine to 18 and 0.7 on June 12, 2011.  Symptoms of SMA syndrome resolved.  Her diet was advanced.  She has had general debilitation and has had physical therapy.  She has improved to the point that she is stable for transfer home, but will have continued physical therapy and home health nursing assistance.  She had a followup chest x-ray which revealed changes of infarction in the right lower lobe.  She is asymptomatic with a respiratory status on the day of admission, however.  She had a group B strep UTI which was treated with IV ceftriaxone.  She has completed her course  at the time of discharge.  Her discharge CBC reveals hemoglobin of 12.3 with no signs of bleeding. She will be resumed on omeprazole.  FOLLOWUP:  The patient will be seen in approximately 2 weeks and will have ongoing physical therapy assessment.     Kingsley Callander. [name], MD     ROF/MEDQ  D:  06/18/2011  T:  06/18/2011  Job:  [id number]

## 2011-06-18 NOTE — Progress Notes (Signed)
Pt discharged with instructions explained to her daughter who is her caregiver, she verbalizes understanding. Orders for Parkinson med, Tylenol, and Paxil verified and changed on Med rec sheet/ d/c AVS.   Pt left the floor to be transported by [facility] [employer] with staff and family, in stable condition.  No further questions or concerns voiced a this time.  Spoke with Dr. Karleen Hampshire' nurse at his office and asked if the he could call in and have delivered some nystatin cream.  She said she would.

## 2011-07-26 ENCOUNTER — Encounter (HOSPITAL_COMMUNITY): Payer: Self-pay | Admitting: Emergency Medicine

## 2011-07-26 ENCOUNTER — Emergency Department (HOSPITAL_COMMUNITY): Payer: Medicare Other

## 2011-07-26 ENCOUNTER — Inpatient Hospital Stay (HOSPITAL_COMMUNITY)
Admission: EM | Admit: 2011-07-26 | Discharge: 2011-08-19 | DRG: 327 | Disposition: A | Discharge status: Skilled Nursing Facility | Payer: Medicare Other | Attending: Internal Medicine | Admitting: Internal Medicine

## 2011-07-26 DIAGNOSIS — K2100 Gastro-esophageal reflux disease with esophagitis, without bleeding: Secondary | ICD-10-CM | POA: Diagnosis present

## 2011-07-26 DIAGNOSIS — K219 Gastro-esophageal reflux disease without esophagitis: Secondary | ICD-10-CM

## 2011-07-26 DIAGNOSIS — Z86718 Personal history of other venous thrombosis and embolism: Secondary | ICD-10-CM

## 2011-07-26 DIAGNOSIS — D649 Anemia, unspecified: Secondary | ICD-10-CM | POA: Diagnosis not present

## 2011-07-26 DIAGNOSIS — G20 Parkinson's disease: Secondary | ICD-10-CM | POA: Diagnosis present

## 2011-07-26 DIAGNOSIS — E46 Unspecified protein-calorie malnutrition: Secondary | ICD-10-CM | POA: Diagnosis present

## 2011-07-26 DIAGNOSIS — I2699 Other pulmonary embolism without acute cor pulmonale: Secondary | ICD-10-CM | POA: Insufficient documentation

## 2011-07-26 DIAGNOSIS — E876 Hypokalemia: Secondary | ICD-10-CM | POA: Diagnosis not present

## 2011-07-26 DIAGNOSIS — I10 Essential (primary) hypertension: Secondary | ICD-10-CM | POA: Diagnosis present

## 2011-07-26 DIAGNOSIS — R1110 Vomiting, unspecified: Secondary | ICD-10-CM

## 2011-07-26 DIAGNOSIS — K311 Adult hypertrophic pyloric stenosis: Secondary | ICD-10-CM

## 2011-07-26 DIAGNOSIS — K449 Diaphragmatic hernia without obstruction or gangrene: Principal | ICD-10-CM | POA: Diagnosis present

## 2011-07-26 DIAGNOSIS — E860 Dehydration: Secondary | ICD-10-CM

## 2011-07-26 DIAGNOSIS — Z8711 Personal history of peptic ulcer disease: Secondary | ICD-10-CM

## 2011-07-26 DIAGNOSIS — G20A1 Parkinson's disease without dyskinesia, without mention of fluctuations: Secondary | ICD-10-CM | POA: Diagnosis present

## 2011-07-26 HISTORY — DX: Other pulmonary embolism without acute cor pulmonale: I26.99

## 2011-07-26 LAB — URINALYSIS, ROUTINE W REFLEX MICROSCOPIC
Glucose, UA: NEGATIVE mg/dL
Ketones, ur: 40 mg/dL — AB
Leukocytes, UA: NEGATIVE
pH: 6 (ref 5.0–8.0)

## 2011-07-26 LAB — BASIC METABOLIC PANEL
CO2: 30 mEq/L (ref 19–32)
Calcium: 10.4 mg/dL (ref 8.4–10.5)
Chloride: 96 mEq/L (ref 96–112)
Creatinine, Ser: 0.8 mg/dL (ref 0.50–1.10)
Glucose, Bld: 179 mg/dL — ABNORMAL HIGH (ref 70–99)

## 2011-07-26 LAB — CBC
HCT: 43.8 % (ref 36.0–46.0)
Hemoglobin: 14.3 g/dL (ref 12.0–15.0)
MCV: 93.8 fL (ref 78.0–100.0)
RBC: 4.67 MIL/uL (ref 3.87–5.11)
WBC: 9.2 10*3/uL (ref 4.0–10.5)

## 2011-07-26 LAB — OCCULT BLOOD, POC DEVICE: Fecal Occult Bld: NEGATIVE

## 2011-07-26 LAB — DIFFERENTIAL
Basophils Absolute: 0 10*3/uL (ref 0.0–0.1)
Eosinophils Relative: 0 % (ref 0–5)
Lymphocytes Relative: 6 % — ABNORMAL LOW (ref 12–46)
Lymphs Abs: 0.6 10*3/uL — ABNORMAL LOW (ref 0.7–4.0)
Monocytes Absolute: 0.4 10*3/uL (ref 0.1–1.0)
Monocytes Relative: 4 % (ref 3–12)
Neutro Abs: 8.3 10*3/uL — ABNORMAL HIGH (ref 1.7–7.7)

## 2011-07-26 LAB — URINE MICROSCOPIC-ADD ON

## 2011-07-26 MED ORDER — ACETAMINOPHEN 325 MG PO TABS
650.0000 mg | ORAL_TABLET | Freq: Four times a day (QID) | ORAL | Status: DC | PRN
  Administered 2011-07-31 – 2011-08-18 (×10): 650 mg via ORAL
  Filled 2011-07-26 (×10): qty 2

## 2011-07-26 MED ORDER — ONDANSETRON HCL 4 MG PO TABS: 4.0000 mg | ORAL_TABLET | Freq: Four times a day (QID) | ORAL | Status: DC | PRN

## 2011-07-26 MED ORDER — SODIUM CHLORIDE 0.9 % IV SOLN: INTRAVENOUS | Status: DC

## 2011-07-26 MED ORDER — ONDANSETRON HCL 4 MG/2ML IJ SOLN: 4.0000 mg | Freq: Four times a day (QID) | INTRAMUSCULAR | Status: DC | PRN

## 2011-07-26 MED ORDER — ONDANSETRON HCL 4 MG/2ML IJ SOLN
4.0000 mg | Freq: Three times a day (TID) | INTRAMUSCULAR | Status: AC | PRN
  Administered 2011-07-26: 4 mg via INTRAVENOUS
  Filled 2011-07-26: qty 2

## 2011-07-26 MED ORDER — AMITRIPTYLINE HCL 10 MG PO TABS
10.0000 mg | ORAL_TABLET | Freq: Every day | ORAL | Status: DC
  Administered 2011-07-27 – 2011-08-08 (×13): 10 mg via ORAL
  Filled 2011-07-26 (×13): qty 1

## 2011-07-26 MED ORDER — PAROXETINE HCL 20 MG PO TABS
10.0000 mg | ORAL_TABLET | Freq: Every day | ORAL | Status: DC
  Administered 2011-07-26 – 2011-07-31 (×6): 10 mg via ORAL
  Administered 2011-08-01: 10:00:00 via ORAL
  Administered 2011-08-02 – 2011-08-08 (×7): 10 mg via ORAL
  Filled 2011-07-26 (×14): qty 1

## 2011-07-26 MED ORDER — ALUM & MAG HYDROXIDE-SIMETH 200-200-20 MG/5ML PO SUSP: 30.0000 mL | Freq: Four times a day (QID) | ORAL | Status: DC | PRN

## 2011-07-26 MED ORDER — POLYETHYLENE GLYCOL 3350 17 G PO PACK
17.0000 g | PACK | Freq: Every day | ORAL | Status: DC
  Administered 2011-07-26 – 2011-08-04 (×10): 17 g via ORAL
  Filled 2011-07-26 (×10): qty 1

## 2011-07-26 MED ORDER — SODIUM CHLORIDE 0.9 % IV SOLN
INTRAVENOUS | Status: DC
  Administered 2011-07-27: 19:00:00 via INTRAVENOUS
  Administered 2011-07-28: 1000 mL via INTRAVENOUS

## 2011-07-26 MED ORDER — ACETAMINOPHEN 650 MG RE SUPP: 650.0000 mg | Freq: Four times a day (QID) | RECTAL | Status: DC | PRN

## 2011-07-26 MED ORDER — PANTOPRAZOLE SODIUM 40 MG PO TBEC
40.0000 mg | DELAYED_RELEASE_TABLET | Freq: Every day | ORAL | Status: DC
  Administered 2011-07-26: 40 mg via ORAL
  Filled 2011-07-26: qty 1

## 2011-07-26 MED ORDER — MIRTAZAPINE 30 MG PO TABS
15.0000 mg | ORAL_TABLET | Freq: Every day | ORAL | Status: DC
  Administered 2011-07-27 – 2011-07-29 (×3): 15 mg via ORAL
  Administered 2011-07-30: 22:00:00 via ORAL
  Administered 2011-07-31 – 2011-08-08 (×9): 15 mg via ORAL
  Filled 2011-07-26 (×8): qty 1
  Filled 2011-07-26: qty 2
  Filled 2011-07-26 (×4): qty 1

## 2011-07-26 MED ORDER — ACETAMINOPHEN ER 650 MG PO TBCR: 650.0000 mg | EXTENDED_RELEASE_TABLET | Freq: Three times a day (TID) | ORAL | Status: DC | PRN

## 2011-07-26 MED ORDER — SODIUM CHLORIDE 0.9 % IV SOLN
INTRAVENOUS | Status: DC
  Administered 2011-07-26: 13:00:00 via INTRAVENOUS

## 2011-07-26 MED ORDER — ONDANSETRON HCL 4 MG/2ML IJ SOLN
4.0000 mg | Freq: Once | INTRAMUSCULAR | Status: AC
  Administered 2011-07-26: 4 mg via INTRAVENOUS
  Filled 2011-07-26: qty 2

## 2011-07-26 MED ORDER — CARBIDOPA-LEVODOPA CR 25-100 MG PO TBCR
2.0000 | EXTENDED_RELEASE_TABLET | Freq: Three times a day (TID) | ORAL | Status: DC
  Administered 2011-07-27 – 2011-08-01 (×17): 2 via ORAL
  Filled 2011-07-26 (×21): qty 2

## 2011-07-26 NOTE — ED Notes (Signed)
Unable to give report at present. Delay explained to pt and family

## 2011-07-26 NOTE — ED Notes (Signed)
Complain of nausea and vomiting. Pt vomited small amt. medicated

## 2011-07-26 NOTE — ED Notes (Signed)
Pt to be eval for admission. Pt and family aware. zofran effective

## 2011-07-26 NOTE — ED Provider Notes (Signed)
History     CSN: [id number] Arrival date & time: 07/26/2011 12:10 PM  Chief Complaint  Patient presents with  . Emesis    (Consider location/radiation/quality/duration/timing/severity/associated sxs/prior treatment) HPI Comments: Patient started" spitting up "around 6:30 PM yesterday. She complained of epigastric pain. Her daughter gave her Mylanta twice, with some relief. This morning she started spitting up a yellow bilious liquid. This was very likely what happened in August 2012, when she was admitted for GI bleeding, and subsequently developed pulmonary embolism, requiring an IVC filter.  She was therefore brought to [facility] ED for evaluation.  Patient is a 77 y.o. female presenting with vomiting. The history is provided by a relative. No language interpreter was used.  Emesis  The current episode started 12 to 24 hours ago. The problem occurs 5 to 10 times per day. The problem has been gradually worsening. The emesis has an appearance of bilious material. There has been no fever. Associated symptoms include abdominal pain. Pertinent negatives include no chills, no diarrhea and no fever (patient felt warm, but there was no recorded fever.).    Past Medical History  Diagnosis Date  . Hypertension   . GERD (gastroesophageal reflux disease)   . Parkinson disease   . Depression   . Pulmonary embolism     Past Surgical History  Procedure Date  . Esophagogastroduodenoscopy 06/10/2011    Procedure: ESOPHAGOGASTRODUODENOSCOPY (EGD);  Surgeon: Malissa Hippo, MD;  Location: [facility] ENDO SUITE;  Service: Endoscopy;  Laterality: N/A;  . Vena cava filter placement     History reviewed. No pertinent family history.  History  Substance Use Topics  . Smoking status: Never Smoker   . Smokeless tobacco: Not on file  . Alcohol Use: No    OB History    Grav Para Term Preterm Abortions TAB SAB Ect Mult Living                  Review of Systems  Constitutional: Negative for fever  (patient felt warm, but there was no recorded fever.) and chills.  HENT: Positive for hearing loss (patient is quite deaf.).   Eyes: Negative.   Respiratory: Negative.   Cardiovascular: Negative.   Gastrointestinal: Positive for vomiting and abdominal pain. Negative for diarrhea.  Genitourinary: Negative.   Musculoskeletal: Negative.   Skin: Negative.   Neurological: Negative.   Psychiatric/Behavioral: Negative.     Allergies  Metoclopramide hcl  Home Medications   Current Outpatient Rx  Name Route Sig Dispense Refill  . ACETAMINOPHEN 650 MG PO TBCR Oral Take 650 mg by mouth every 8 (eight) hours as needed. For pain      . AMITRIPTYLINE HCL 10 MG PO TABS Oral Take 10 mg by mouth at bedtime.      Marland Kitchen CARBIDOPA-LEVODOPA 25-100 MG PO TBCR Oral Take 2 tablets by mouth 3 (three) times daily.      Marland Kitchen OMEPRAZOLE 20 MG PO TBEC Oral Take by mouth 1 day or 1 dose.      Marland Kitchen PAROXETINE HCL 10 MG PO TABS Oral Take 10 mg by mouth every morning.      Marland Kitchen CARBIDOPA-LEVODOPA-ENTACAPONE 25-100-200 MG PO TABS Oral Take 1 tablet by mouth 3 (three) times daily.     Marland Kitchen MIRTAZAPINE 15 MG PO TABS Oral Take 15 mg by mouth at bedtime.      Marland Kitchen PAROXETINE MESYLATE 10 MG PO TABS Oral Take 10 mg by mouth every morning.        BP 154/98  Pulse  111  Temp(Src) 97.6 F (36.4 C) (Oral)  Resp 14  SpO2 93%  Physical Exam  Constitutional: She is oriented to person, place, and time.       Patient is a pale elderly woman who appears chronically ill. She is quite deaf.  HENT:  Head: Normocephalic and atraumatic.  Right Ear: External ear normal.  Left Ear: External ear normal.  Mouth/Throat: Oropharynx is clear and moist.  Eyes: Conjunctivae and EOM are normal. Pupils are equal, round, and reactive to light. No scleral icterus.  Neck: Neck supple.  Cardiovascular: Normal rate, regular rhythm and normal heart sounds.   Pulmonary/Chest: Effort normal and breath sounds normal.  Abdominal: Soft. She exhibits no  distension and no mass. There is no tenderness.  Genitourinary:       Rectal exam shows some evidence of fecal impaction. The stool was Negative for blood by Hemoccult.  Musculoskeletal: Normal range of motion.  Lymphadenopathy:    She has no cervical adenopathy.  Neurological: She is alert and oriented to person, place, and time.       No apparent sensory or motor deficits.  Skin: Skin is warm and dry. There is pallor.  Psychiatric: She has a normal mood and affect. Her behavior is normal.    ED Course  Procedures (including critical care time)  Labs Reviewed  DIFFERENTIAL - Abnormal; Notable for the following:    Neutrophils Relative 90 (*)    Neutro Abs 8.3 (*)    Lymphocytes Relative 6 (*)    Lymphs Abs 0.6 (*)    All other components within normal limits  BASIC METABOLIC PANEL - Abnormal; Notable for the following:    Glucose, Bld 179 (*)    GFR calc non Af Amer 65 (*)    GFR calc Af Amer 75 (*)    All other components within normal limits  URINALYSIS, ROUTINE W REFLEX MICROSCOPIC - Abnormal; Notable for the following:    Specific Gravity, Urine >1.030 (*)    Hgb urine dipstick LARGE (*)    Bilirubin Urine SMALL (*)    Ketones, ur 40 (*)    Protein, ur 100 (*)    All other components within normal limits  URINE MICROSCOPIC-ADD ON - Abnormal; Notable for the following:    Bacteria, UA FEW (*)    All other components within normal limits  CBC  OCCULT BLOOD, POC DEVICE  URINE CULTURE   Dg Abd Acute W/chest  07/26/2011  *RADIOLOGY REPORT*  Clinical Data: Vomiting, weakness, recent GI bleed and pulmonary embolism  ACUTE ABDOMEN SERIES (ABDOMEN 2 VIEW & CHEST 1 VIEW)  Comparison: Chest radiograph 06/15/2011, abdominal radiograph 06/14/2011  Findings: Enlargement of cardiac silhouette. Very large hiatal hernia with air-fluid level. Mediastinal contours pulmonary vascularity normal. Minimal bibasilar atelectasis. Lungs otherwise clear. Bones demineralized. IVC filter noted.  Prominent stool in rectum. Paucity of bowel gas. No gross evidence of bowel obstruction or free air. Dextroconvex thoracolumbar scoliosis, pronounced, with multilevel degenerative disc and facet disease changes of the spine. No definite urinary tract calcification.  IMPRESSION: Slightly prominent stool in rectum. Very large hiatal hernia. Minimal bibasilar atelectasis. No additional significant abdominal findings.  Original Report Authenticated By: Lollie Marrow, M.D.    5:13 PM Course in the ED: The patient was seen and had physical examination. Rectal exam showed some fecal impaction, but no blood in the stool.  Laboratory testing an acute abdominal series x-rays were ordered. Old charts were reviewed. 5:13 PM Lab testing was reviewed. The  CBC showed a hematocrit of 43.8 hemoglobin of 14.3 white blood count 9200 with 90% neutrophils. A urinalysis showed 11-20 RBCs, and few bacteria electrolytes blood sugar and BUN are normal. An acute abdominal x-ray series showed a very large hiatal hernia. The IVC filter was noted.  5:13 PM Patient's condition was discussed with Dr. Juanetta Gosling, covering for Dr. [name]. He will admit her for observation overnight.  1. Vomiting       MDM          Carleene Cooper III, MD 07/26/11 [phone]

## 2011-07-26 NOTE — ED Notes (Signed)
Pt complain of nausea that started last night. States she vomited twice today

## 2011-07-26 NOTE — Plan of Care (Signed)
Problem: Phase I Progression Outcomes Goal: Voiding-avoid urinary catheter unless indicated Outcome: Completed/Met Date Met:  07/26/11 incont

## 2011-07-26 NOTE — ED Notes (Signed)
Pt complain of n/v. edp aware

## 2011-07-27 DIAGNOSIS — R112 Nausea with vomiting, unspecified: Secondary | ICD-10-CM

## 2011-07-27 DIAGNOSIS — R1310 Dysphagia, unspecified: Secondary | ICD-10-CM

## 2011-07-27 LAB — BASIC METABOLIC PANEL
BUN: 28 mg/dL — ABNORMAL HIGH (ref 6–23)
Calcium: 9.1 mg/dL (ref 8.4–10.5)
GFR calc Af Amer: 60 mL/min — ABNORMAL LOW (ref >90)
GFR calc non Af Amer: 51 mL/min — ABNORMAL LOW (ref >90)
Glucose, Bld: 118 mg/dL — ABNORMAL HIGH (ref 70–99)
Potassium: 4.3 mEq/L (ref 3.5–5.1)
Sodium: 138 mEq/L (ref 135–145)

## 2011-07-27 LAB — PROTIME-INR
INR: 1.15 (ref 0.00–1.49)
Prothrombin Time: 14.9 seconds (ref 11.6–15.2)

## 2011-07-27 LAB — CBC
Hemoglobin: 11.5 g/dL — ABNORMAL LOW (ref 12.0–15.0)
MCH: 31.4 pg (ref 26.0–34.0)
MCHC: 33.1 g/dL (ref 30.0–36.0)
Platelets: 215 10*3/uL (ref 150–400)

## 2011-07-27 MED ORDER — SODIUM CHLORIDE 0.9 % IJ SOLN
INTRAMUSCULAR | Status: AC
  Administered 2011-07-27: 11:00:00
  Filled 2011-07-27: qty 10

## 2011-07-27 MED ORDER — PANTOPRAZOLE SODIUM 40 MG IV SOLR
40.0000 mg | INTRAVENOUS | Status: DC
  Administered 2011-07-27 – 2011-07-28 (×2): 40 mg via INTRAVENOUS
  Filled 2011-07-27 (×2): qty 40

## 2011-07-27 NOTE — Consult Note (Signed)
Job [id number]

## 2011-07-27 NOTE — Progress Notes (Signed)
Subjective: She says she's been able to keep clear liquids down this morning. She has no other new complaints. I have discussed her situation with Dr. Dionicia Abler and he is beginning his assessment now.  Objective: Vital signs in last 24 hours: Temp:  [97.4 F (36.3 C)-97.9 F (36.6 C)] 97.9 F (36.6 C) (10/02 0720) Pulse Rate:  [94-112] 94  (10/02 0720) Resp:  [14-22] 18  (10/02 0720) BP: (112-157)/(73-98) 125/77 mmHg (10/02 0720) SpO2:  [90 %-95 %] 91 % (10/02 0720) Weight:  [56.2 kg (123 lb 14.4 oz)] 123 lb 14.4 oz (56.2 kg) (10/02 0530) Weight change:  Last BM Date: 07/24/11  Intake/Output from previous day: 10/01 0701 - 10/02 0700 In: 900 [I.V.:900] Out: -   PHYSICAL EXAM General appearance: alert and mild distress Resp: clear to auscultation bilaterally Cardio: regular rate and rhythm, S1, S2 normal, no murmur, click, rub or gallop GI: Mild diffuse tenderness Extremities: extremities normal, atraumatic, no cyanosis or edema  Lab Results:    Basic Metabolic Panel:  Basename 07/27/11 0530 07/26/11 1303  NA 138 140  K 4.3 4.5  CL 102 96  CO2 28 30  GLUCOSE 118* 179*  BUN 28* 17  CREATININE 0.97 0.80  CALCIUM 9.1 10.4  MG -- --  PHOS -- --   Liver Function Tests: No results found for this basename: AST:2,ALT:2,ALKPHOS:2,BILITOT:2,PROT:2,ALBUMIN:2 in the last 72 hours No results found for this basename: LIPASE:2,AMYLASE:2 in the last 72 hours No results found for this basename: AMMONIA:2 in the last 72 hours CBC:  Basename 07/27/11 0530 07/27/11 0023 07/26/11 1303  WBC 12.7* -- 9.2  NEUTROABS -- -- 8.3*  HGB 11.5* 12.7 --  HCT 34.7* 37.7 --  MCV 94.8 -- 93.8  PLT 215 -- 251   Cardiac Enzymes: No results found for this basename: CKTOTAL:3,CKMB:3,CKMBINDEX:3,TROPONINI:3 in the last 72 hours BNP: No results found for this basename: POCBNP:3 in the last 72 hours D-Dimer: No results found for this basename: DDIMER:2 in the last 72 hours CBG: No results found  for this basename: GLUCAP:6 in the last 72 hours Hemoglobin A1C: No results found for this basename: HGBA1C in the last 72 hours Fasting Lipid Panel: No results found for this basename: CHOL,HDL,LDLCALC,TRIG,CHOLHDL,LDLDIRECT in the last 72 hours Thyroid Function Tests: No results found for this basename: TSH,T4TOTAL,FREET4,T3FREE,THYROIDAB in the last 72 hours Anemia Panel: No results found for this basename: VITAMINB12,FOLATE,FERRITIN,TIBC,IRON,RETICCTPCT in the last 72 hours Urine Drug Screen:  Alcohol Level: No results found for this basename: ETH:2 in the last 72 hours Urinalysis:  Misc. Labs:  ABGS No results found for this basename: PHART,PCO2,PO2ART,TCO2,HCO3 in the last 72 hours CULTURES No results found for this or any previous visit (from the past 240 hour(s)). Studies/Results: Dg Abd Acute W/chest  07/26/2011  *RADIOLOGY REPORT*  Clinical Data: Vomiting, weakness, recent GI bleed and pulmonary embolism  ACUTE ABDOMEN SERIES (ABDOMEN 2 VIEW & CHEST 1 VIEW)  Comparison: Chest radiograph 06/15/2011, abdominal radiograph 06/14/2011  Findings: Enlargement of cardiac silhouette. Very large hiatal hernia with air-fluid level. Mediastinal contours pulmonary vascularity normal. Minimal bibasilar atelectasis. Lungs otherwise clear. Bones demineralized. IVC filter noted. Prominent stool in rectum. Paucity of bowel gas. No gross evidence of bowel obstruction or free air. Dextroconvex thoracolumbar scoliosis, pronounced, with multilevel degenerative disc and facet disease changes of the spine. No definite urinary tract calcification.  IMPRESSION: Slightly prominent stool in rectum. Very large hiatal hernia. Minimal bibasilar atelectasis. No additional significant abdominal findings.  Original Report Authenticated By: Lollie Marrow, M.D.  Medications:  Prior to Admission:  Prescriptions prior to admission  Medication Sig Dispense Refill  . acetaminophen (TYLENOL) 650 MG CR tablet Take  650 mg by mouth every 8 (eight) hours as needed. For pain        . amitriptyline (ELAVIL) 10 MG tablet Take 10 mg by mouth at bedtime.        . carbidopa-levodopa (SINEMET CR) 25-100 MG per tablet Take 2 tablets by mouth 3 (three) times daily.        . Omeprazole 20 MG TBEC Take by mouth 1 day or 1 dose.        Marland Kitchen PARoxetine (PAXIL) 10 MG tablet Take 10 mg by mouth every morning.        . carbidopa-levodopa-entacapone (STALEVO) 25-100-200 MG per tablet Take 1 tablet by mouth 3 (three) times daily.       . mirtazapine (REMERON) 15 MG tablet Take 15 mg by mouth at bedtime.        . paroxetine mesylate (PEXEVA) 10 MG tablet Take 10 mg by mouth every morning.         Scheduled:   . amitriptyline  10 mg Oral QHS  . carbidopa-levodopa  2 tablet Oral TID  . mirtazapine  15 mg Oral QHS  . ondansetron (ZOFRAN) IV  4 mg Intravenous Once  . pantoprazole  40 mg Oral Q1200  . PARoxetine  10 mg Oral Daily  . polyethylene glycol  17 g Oral Daily   Continuous:   . sodium chloride 50 mL/hr at 07/26/11 1238  . sodium chloride    . sodium chloride    . sodium chloride 75 mL/hr at 07/27/11 1610   RUE:[id number], acetaminophen, alum & mag hydroxide-simeth, ondansetron (ZOFRAN) IV, ondansetron (ZOFRAN) IV, ondansetron, DISCONTD: acetaminophen  Assesment: She has abdominal problems. She's had some nausea. Her hemoglobin level has dropped some of which may be dilutional. Her stool was negative for blood in the emergency room. She does have a history of esophageal and gastric ulcerations however Active Problems:  * No active hospital problems. *     Plan: We'll continue his current treatments, have her get another CBC in the morning and see what Dr. Karilyn Cota thinks we need to do.    LOS: 1 day   Vitali Seibert L 07/27/2011, 8:49 AM

## 2011-07-27 NOTE — H&P (Signed)
[name] MRN: [id number] DOB/AGE: 1922/12/29 77 y.o. Primary Care Physician:No primary provider on file. Admit date: 07/26/2011 Chief Complaint: Nausea and vomiting of bilious material HPI: This is an 77 year old who had a complicated hospitalization in August of this year with GI bleeding and DVT with pulmonary embolus and eventual vena cava filter placement. She went home and did fairly well and then started having some nausea with vomiting of bilious material in the last 24 hours. She initially resisted coming to the hospital but eventually relented and came to the hospital for this. She was seen in the emergency room on the first and did not seem to have any active bleeding at that time but is admitted to observation to be sure that she's okay. I saw her last night and dictated history and physical on the phone end dictation system which apparently has been lost  Past Medical History  Diagnosis Date  . Hypertension   . GERD (gastroesophageal reflux disease)   . Parkinson disease   . Depression   . Pulmonary embolism    Past Surgical History  Procedure Date  . Esophagogastroduodenoscopy 06/10/2011    Procedure: ESOPHAGOGASTRODUODENOSCOPY (EGD);  Surgeon: Malissa Hippo, MD;  Location: [facility];  Service: Endoscopy;  Laterality: N/A;  . Vena cava filter placement    she also has had a hysterectomy a lumpectomy on the right breast cataract surgery and foot surgery     History reviewed. No pertinent family history.  her father died o and her mother died in her 36s. She had hypertension but the cause of her death is unclearf a stroke she had multiple brothers who had coronary artery occlusive disease Social History:  reports that she has never smoked. She does not have any smokeless tobacco history on file. She reports that she does not drink alcohol or use illicit drugs.  she lives at home and has multiple family members involved in her care   Allergies:  Allergies  Allergen  Reactions  . Metoclopramide Hcl     Medications Prior to Admission  Medication Dose Route Frequency Provider Last Rate Last Dose  . 0.9 %  sodium chloride infusion   Intravenous Continuous Carleene Cooper III, MD 50 mL/hr at 07/26/11 1238    . 0.9 %  sodium chloride infusion   Intravenous Continuous Carleene Cooper III, MD      . 0.9 %  sodium chloride infusion   Intravenous Continuous Carleene Cooper III, MD      . 0.9 %  sodium chloride infusion   Intravenous Continuous Fredirick Maudlin 75 mL/hr at 07/27/11 0529    . acetaminophen (TYLENOL) tablet 650 mg  650 mg Oral Q6H PRN Fredirick Maudlin       Or  . acetaminophen (TYLENOL) suppository 650 mg  650 mg Rectal Q6H PRN Fredirick Maudlin      . alum & mag hydroxide-simeth (MAALOX/MYLANTA) 200-200-20 MG/5ML suspension 30 mL  30 mL Oral Q6H PRN Fredirick Maudlin      . amitriptyline (ELAVIL) tablet 10 mg  10 mg Oral QHS Fredirick Maudlin      . carbidopa-levodopa (SINEMET CR) 25-100 MG per tablet 2 tablet  2 tablet Oral TID Fredirick Maudlin      . mirtazapine (REMERON) tablet 15 mg  15 mg Oral QHS Fredirick Maudlin      . ondansetron (ZOFRAN) injection 4 mg  4 mg Intravenous Once Carleene Cooper III, MD   4 mg at 07/26/11  1603  . ondansetron (ZOFRAN) injection 4 mg  4 mg Intravenous Q8H PRN Carleene Cooper III, MD   4 mg at 07/26/11 1756  . ondansetron (ZOFRAN) tablet 4 mg  4 mg Oral Q6H PRN Fredirick Maudlin       Or  . ondansetron [facility]) injection 4 mg  4 mg Intravenous Q6H PRN Fredirick Maudlin      . pantoprazole (PROTONIX) EC tablet 40 mg  40 mg Oral Q1200 Margarine Grosshans L Malaquias Lenker   40 mg at 07/26/11 2031  . PARoxetine (PAXIL) tablet 10 mg  10 mg Oral Daily Fredirick Maudlin   10 mg at 07/26/11 2030  . polyethylene glycol (MIRALAX / GLYCOLAX) packet 17 g  17 g Oral Daily Fredirick Maudlin   17 g at 07/26/11 2030  . DISCONTD: acetaminophen (TYLENOL) CR tablet 650 mg  650 mg Oral Q8H PRN Fredirick Maudlin       Medications Prior to Admission  Medication Sig  Dispense Refill  . amitriptyline (ELAVIL) 10 MG tablet Take 10 mg by mouth at bedtime.        . Omeprazole 20 MG TBEC Take by mouth 1 day or 1 dose.        Marland Kitchen PARoxetine (PAXIL) 10 MG tablet Take 10 mg by mouth every morning.        . carbidopa-levodopa-entacapone (STALEVO) 25-100-200 MG per tablet Take 1 tablet by mouth 3 (three) times daily.       . mirtazapine (REMERON) 15 MG tablet Take 15 mg by mouth at bedtime.        . paroxetine mesylate (PEXEVA) 10 MG tablet Take 10 mg by mouth every morning.             EAV:WUJWJ from the symptoms mentioned above,there are no other symptoms referable to all systems reviewed.  Physical Exam: Blood pressure 125/77, pulse 94, temperature 97.9 F (36.6 C), temperature source Oral, resp. rate 18, height 4\' 8"  (1.422 m), weight 56.2 kg (123 lb 14.4 oz), SpO2 91.00%.  she is thin she shows signs of Parkinson's disease she is somewhat hard of hearing. She is alert and appears somewhat weak. Her HEENT shows her pupils are reactive doesn't clear her mucous membranes are moist her neck is supple without masses bruits or JVD. Her chest is clear without wheezes rales or rhonchi. Her heart is regular without murmur gallop or rub. Her abdomen is soft without masses. Her extremity showed no clubbing cyanosis or edema. Central nervous system examination is grossly intact with the exception of her Parkinson's disease   Results for orders placed during the hospital encounter of 07/26/11 (from the past 48 hour(s))  CBC     Status: Normal   Collection Time   07/26/11  1:03 PM      Component Value Range Comment   WBC 9.2  4.0 - 10.5 (K/uL)    RBC 4.67  3.87 - 5.11 (MIL/uL)    Hemoglobin 14.3  12.0 - 15.0 (g/dL)    HCT 19.1  47.8 - 29.5 (%)    MCV 93.8  78.0 - 100.0 (fL)    MCH 30.6  26.0 - 34.0 (pg)    MCHC 32.6  30.0 - 36.0 (g/dL)    RDW 62.1  30.8 - 65.7 (%)    Platelets 251  150 - 400 (K/uL)   DIFFERENTIAL     Status: Abnormal   Collection Time   07/26/11   1:03 PM      Component  Value Range Comment   Neutrophils Relative 90 (*) 43 - 77 (%)    Neutro Abs 8.3 (*) 1.7 - 7.7 (K/uL)    Lymphocytes Relative 6 (*) 12 - 46 (%)    Lymphs Abs 0.6 (*) 0.7 - 4.0 (K/uL)    Monocytes Relative 4  3 - 12 (%)    Monocytes Absolute 0.4  0.1 - 1.0 (K/uL)    Eosinophils Relative 0  0 - 5 (%)    Eosinophils Absolute 0.0  0.0 - 0.7 (K/uL)    Basophils Relative 0  0 - 1 (%)    Basophils Absolute 0.0  0.0 - 0.1 (K/uL)   BASIC METABOLIC PANEL     Status: Abnormal   Collection Time   07/26/11  1:03 PM      Component Value Range Comment   Sodium 140  135 - 145 (mEq/L)    Potassium 4.5  3.5 - 5.1 (mEq/L)    Chloride 96  96 - 112 (mEq/L)    CO2 30  19 - 32 (mEq/L)    Glucose, Bld 179 (*) 70 - 99 (mg/dL)    BUN 17  6 - 23 (mg/dL)    Creatinine, Ser 1.61  0.50 - 1.10 (mg/dL)    Calcium 09.6  8.4 - 10.5 (mg/dL)    GFR calc non Af Amer 65 (*) >90 (mL/min)    GFR calc Af Amer 75 (*) >90 (mL/min)   OCCULT BLOOD, POC DEVICE     Status: Normal   Collection Time   07/26/11  2:17 PM      Component Value Range Comment   Fecal Occult Bld NEGATIVE     URINALYSIS, ROUTINE W REFLEX MICROSCOPIC     Status: Abnormal   Collection Time   07/26/11  2:38 PM      Component Value Range Comment   Color, Urine YELLOW  YELLOW     Appearance CLEAR  CLEAR     Specific Gravity, Urine >1.030 (*) 1.005 - 1.030     pH 6.0  5.0 - 8.0     Glucose, UA NEGATIVE  NEGATIVE (mg/dL)    Hgb urine dipstick LARGE (*) NEGATIVE     Bilirubin Urine SMALL (*) NEGATIVE     Ketones, ur 40 (*) NEGATIVE (mg/dL)    Protein, ur 045 (*) NEGATIVE (mg/dL)    Urobilinogen, UA 0.2  0.0 - 1.0 (mg/dL)    Nitrite NEGATIVE  NEGATIVE     Leukocytes, UA NEGATIVE  NEGATIVE    URINE MICROSCOPIC-ADD ON     Status: Abnormal   Collection Time   07/26/11  2:38 PM      Component Value Range Comment   Squamous Epithelial / LPF RARE  RARE     WBC, UA 3-6  <3 (WBC/hpf)    RBC / HPF 11-20  <3 (RBC/hpf)    Bacteria, UA  FEW (*) RARE    HEMOGLOBIN AND HEMATOCRIT, BLOOD     Status: Normal   Collection Time   07/27/11 12:23 AM      Component Value Range Comment   Hemoglobin 12.7  12.0 - 15.0 (g/dL)    HCT 40.9  81.1 - 91.4 (%)   BASIC METABOLIC PANEL     Status: Abnormal   Collection Time   07/27/11  5:30 AM      Component Value Range Comment   Sodium 138  135 - 145 (mEq/L)    Potassium 4.3  3.5 - 5.1 (mEq/L)    Chloride  102  96 - 112 (mEq/L)    CO2 28  19 - 32 (mEq/L)    Glucose, Bld 118 (*) 70 - 99 (mg/dL)    BUN 28 (*) 6 - 23 (mg/dL) DELTA CHECK NOTED   Creatinine, Ser 0.97  0.50 - 1.10 (mg/dL)    Calcium 9.1  8.4 - 10.5 (mg/dL)    GFR calc non Af Amer 51 (*) >90 (mL/min)    GFR calc Af Amer 60 (*) >90 (mL/min)   CBC     Status: Abnormal   Collection Time   07/27/11  5:30 AM      Component Value Range Comment   WBC 12.7 (*) 4.0 - 10.5 (K/uL)    RBC 3.66 (*) 3.87 - 5.11 (MIL/uL)    Hemoglobin 11.5 (*) 12.0 - 15.0 (g/dL)    HCT 16.1 (*) 09.6 - 46.0 (%)    MCV 94.8  78.0 - 100.0 (fL)    MCH 31.4  26.0 - 34.0 (pg)    MCHC 33.1  30.0 - 36.0 (g/dL)    RDW 04.5  40.9 - 81.1 (%)    Platelets 215  150 - 400 (K/uL)   PROTIME-INR     Status: Normal   Collection Time   07/27/11  5:30 AM      Component Value Range Comment   Prothrombin Time 14.9  11.6 - 15.2 (seconds)    INR 1.15  0.00 - 1.49     No results found for this or any previous visit (from the past 240 hour(s)).  Dg Abd Acute W/chest  07/26/2011  *RADIOLOGY REPORT*  Clinical Data: Vomiting, weakness, recent GI bleed and pulmonary embolism  ACUTE ABDOMEN SERIES (ABDOMEN 2 VIEW & CHEST 1 VIEW)  Comparison: Chest radiograph 06/15/2011, abdominal radiograph 06/14/2011  Findings: Enlargement of cardiac silhouette. Very large hiatal hernia with air-fluid level. Mediastinal contours pulmonary vascularity normal. Minimal bibasilar atelectasis. Lungs otherwise clear. Bones demineralized. IVC filter noted. Prominent stool in rectum. Paucity of bowel gas.  No gross evidence of bowel obstruction or free air. Dextroconvex thoracolumbar scoliosis, pronounced, with multilevel degenerative disc and facet disease changes of the spine. No definite urinary tract calcification.  IMPRESSION: Slightly prominent stool in rectum. Very large hiatal hernia. Minimal bibasilar atelectasis. No additional significant abdominal findings.  Original Report Authenticated By: Lollie Marrow, M.D.   Impression:  she is admitted with vomiting. There is concerned because this is how she started with her last admission and ended up having a severe illness. In addition to that she has Parkinson's disease GERD hypertension diabetes anxiety osteoporosis and hyperlipidemia.  Active Problems:  * No active hospital problems. *      Plan:  she'll have serial hemoglobin and hematocrits, clear liquid diet for now, and I will last Dr. Karilyn Cota who saw her in the hospital last hospitalization see her again       Chailyn Racette L 07/27/2011, 7:59 AM

## 2011-07-27 NOTE — Consult Note (Signed)
[name], [name]                 ACCOUNT NO.:  [id number]  MEDICAL RECORD NO.:  [id number]  LOCATION:  A316                          FACILITY:  [facility]  PHYSICIAN:  Lionel December, M.D.    DATE OF BIRTH:  02-21-1923  DATE OF CONSULTATION:  07/27/2011 DATE OF DISCHARGE:                                CONSULTATION   REASON FOR CONSULTATION:  Nausea and vomiting in a patient with known history of chronic GERD, peptic ulcer disease, and SMA syndrome.  HISTORY OF PRESENT ILLNESS:  [name] is an 77 year old Caucasian female who was admitted to Dr. Juanetta Gosling' Service via emergency room yesterday in Dr. [name] absence.  She came to emergency room after having vomited the day before and the day of admission.  The patient denies hematemesis or melena.  In the emergency room, she had Hemoccult and it was negative.  The patient was treated with IV fluids and a PPI.  She is currently on clear liquids and she is able to keep these down.  She is also on her p.o. medicines and she is able to keep these down.  The patient was hospitalized between August 14 and August 24 for nausea, vomiting, and hematemesis.  During that admission she had a GI workup. She had a very dilated stomach and duodenum and vertical cut off in the third part of the duodenum consistent with SMA syndrome.  She had esophagogastroduodenoscopy on June 10, 2011 by me and she had ulcerative reflux esophagitis and moderate-to-large sliding hiatal hernia and too small antral ulcers.  She had dilated duodenum and there was no transition.  Her H. pylori was negative.  She was maintained on a PPI.  She was gradually transitioned from clear liquids to regular meal and she was able to tolerate it.  It was felt that she was high risk for any surgical intervention.  Her last hospitalization also pertinent for an abnormal chest and abdominal CTs revealing pulmonary embolism. Because of upper GI bleed, she was not felt to be a candidate  for anticoagulation and she had IVC filter placed.  During her last admission, she was given 2 units of PRBCs.  This morning, she denies heartburn, dysphagia, or abdominal pain.  She tells me that she was able to keep her clear liquids and saliva gel down.  She also denies chest pain or shortness of breath.  She knows that she is at [facility], but she is not sure about her meds, etc.  HOME MEDICATIONS: 1. Omeprazole 20 mg p.o. daily. 2. Paxil 10 mg p.o. daily. 3. Mirtazapine 15 p.o. daily. 4. Stalevo 25/100/200 t.i.d. 5. Amitriptyline 10 mg p.o. nightly. 6. O2 2 liters via nasal cannula.  CURRENT MEDICATIONS: 1. Amitriptyline 10 mg p.o. nightly. 2. Carbidopa/levodopa 2 tablets p.o. 3 times a day. 3. Mirtazapine 15 mg p.o. nightly. 4. Pantoprazole 40 mg IV q.24 h. 5. Paroxetine 10 mg daily. 6. Polyethylene glycol 17 g p.o. daily.  She is receiving IV fluids.  P.R.N. MEDICATIONS:  Zofran, acetaminophen, alum and mag hydroxide simethicone 30 males p.r.n.  PAST MEDICAL HISTORY:  She has hypertension.  History of Parkinson disease.  She was recently treated for  urinary tract infection.  She has chronic GERD with known large hiatal hernia which was diagnosed for about 40 years ago.  Recent hospitalization when she was found to have ulcerative esophagitis, gastric ulcers, and SMA syndrome.  And last admission also pertinent for pulmonary embolism, status post IVC filter placement.  ALLERGIES:  METOCLOPRAMIDE.  SOCIAL HISTORY:  She stays at home with a daughter but they have sitters.  There is no history of prior cigarette smoking or alcohol use.  FAMILY HISTORY:  Not available.  OBJECTIVE:  GENERAL:  Pleasant, well-developed, well-nourished Caucasian female who is in no acute distress.  She is somewhat hard of hearing. VITAL SIGNS:  Admission weight 128 pounds.  She is 4 feet 8 inches tall. Pulse 94 with a blood pressure 125/77, respiratory rate 18,  and temperature is 97.9. HEENT:  Conjunctivae are pink.  Sclerae nonicteric.  Oral pharyngeal mucosa is moist and normal.  She has partial upper plate. NECK:  No neck masses or thyromegaly noted. CARDIAC:  Regular rhythm.  Normal S1 and S2.  No murmur or gallop noted. LUNGS:  Clear to auscultation. ABDOMEN:  Full, but bowel sounds are normal.  On palpation, soft is abdomen with mild midepigastric tenderness, but no organomegaly or masses noted. RECTAL:  Deferred. EXTREMITIES:  Thin, but no clubbing or koilonychia noted.  LAB DATA FROM ADMISSION:  WBC 9.2, H and H 14.3 and 43.8, platelet count 251 K.  Serum sodium 140, potassium 4.5, chloride 96, CO2 of 30, glucose was 179, BUN 17, and creatinine 0.80.  Urine analysis, pertinent for spastic gravity of greater than 30, positive ketones, 2-6 wbc's and 11-20 rbc's.  Lab data from this morning, WBC 12.7, H and H is 11.5 and 34.7.  Her electrolytes are normal.  BUN is 28, creatinine 0.97.  Acute abdominal series on admission revealed prominent stool in the rectum, large hiatal hernia, basilar atelectasis, and IVC filter in place.  No air fluid levels noted in small or large bowel.  ASSESSMENT:  [name] is an 77 year old Caucasian female with history of Parkinson disease, known large hiatal hernia who presents with recurrent emesis.  She was hospitalized in August this year with upper GI bleed and found to have ulcerative reflux esophagitis, gastric ulcers, and superior mesenteric artery syndrome, but responded to conservative therapy.  She has been maintained on a PPI.  Now, she presents with emesis, but she has not experienced any hematemesis and stool was guaiac negative.  I suspect her symptoms are either due to gastroesophageal reflux disease given that she has a large hernia or SMA syndrome.  It is possible that her symptoms mighty have been triggered some food that she ate.  Given the patient's overall health,  she is not a  candidate for surgical intervention.  RECOMMENDATIONS:  We will leave her on clear liquids today.  We will check her LFTs and H and H in a.m.  If she does well, we will advance the diet to full liquids in a.m.  May consider CT of her abdomen should her symptoms relapse.  I will stop by later in the day to discuss condition with her daughter and her niece if they are around.  We appreciate the opportunity to participate in the care of this nice lady.          ______________________________ Lionel December, M.D.     NR/MEDQ  D:  07/27/2011  T:  07/27/2011  Job:  [id number]

## 2011-07-28 ENCOUNTER — Observation Stay (HOSPITAL_COMMUNITY): Payer: Medicare Other

## 2011-07-28 LAB — HEPATIC FUNCTION PANEL
Albumin: 2.9 g/dL — ABNORMAL LOW (ref 3.5–5.2)
Alkaline Phosphatase: 56 U/L (ref 39–117)
Indirect Bilirubin: 0.2 mg/dL — ABNORMAL LOW (ref 0.3–0.9)
Total Protein: 6 g/dL (ref 6.0–8.3)

## 2011-07-28 LAB — CBC
HCT: 32.3 % — ABNORMAL LOW (ref 36.0–46.0)
MCH: 31.7 pg (ref 26.0–34.0)
MCHC: 33.1 g/dL (ref 30.0–36.0)
MCV: 95.6 fL (ref 78.0–100.0)
Platelets: 189 10*3/uL (ref 150–400)
RDW: 15.3 % (ref 11.5–15.5)
WBC: 9 10*3/uL (ref 4.0–10.5)

## 2011-07-28 LAB — DIFFERENTIAL
Basophils Absolute: 0 10*3/uL (ref 0.0–0.1)
Basophils Relative: 0 % (ref 0–1)
Eosinophils Absolute: 0 10*3/uL (ref 0.0–0.7)
Eosinophils Relative: 0 % (ref 0–5)
Monocytes Absolute: 1 10*3/uL (ref 0.1–1.0)

## 2011-07-28 LAB — URINE CULTURE
Colony Count: NO GROWTH
Culture  Setup Time: 201210020203

## 2011-07-28 LAB — BASIC METABOLIC PANEL
CO2: 28 mEq/L (ref 19–32)
Calcium: 9.1 mg/dL (ref 8.4–10.5)
Creatinine, Ser: 0.68 mg/dL (ref 0.50–1.10)
GFR calc Af Amer: 89 mL/min — ABNORMAL LOW (ref >90)
GFR calc non Af Amer: 77 mL/min — ABNORMAL LOW (ref >90)
Sodium: 136 mEq/L (ref 135–145)

## 2011-07-28 LAB — OCCULT BLOOD X 1 CARD TO LAB, STOOL: Fecal Occult Bld: POSITIVE

## 2011-07-28 MED ORDER — SODIUM CHLORIDE 0.9 % IJ SOLN
INTRAMUSCULAR | Status: AC
  Filled 2011-07-28: qty 10

## 2011-07-28 MED ORDER — PANTOPRAZOLE SODIUM 40 MG IV SOLR
40.0000 mg | Freq: Two times a day (BID) | INTRAVENOUS | Status: DC
  Administered 2011-07-28 – 2011-08-01 (×9): 40 mg via INTRAVENOUS
  Filled 2011-07-28 (×9): qty 40

## 2011-07-28 MED ORDER — SODIUM CHLORIDE 0.9 % IJ SOLN
INTRAMUSCULAR | Status: AC
  Administered 2011-07-28
  Filled 2011-07-28: qty 10

## 2011-07-28 NOTE — Progress Notes (Signed)
Subjective:  [name] admitted with nausea and vomiting. She denies hematemesis.  She denies nausea at this time.    She has had a drop in her hemoglobin. 07/27/2011 H and H 12.7 and 37.7. Today her H and H 10.7 and 32.3. She says she does not feel good this am.  She is drinking her ginger ale.  Albumin is low at 2.9.  She did have a stool during the night and it was guaiac positive.  Objective: Vital signs in last 24 hours: Temp:  [97.5 F (36.4 C)-98.2 F (36.8 C)] 97.6 F (36.4 C) (10/03 0454) Pulse Rate:  [88-96] 90  (10/03 0812) Resp:  [16-20] 18  (10/03 0812) BP: (85-174)/(52-102) 174/102 mmHg (10/03 0454) SpO2:  [94 %-98 %] 96 % (10/03 0812) Weight:  [127 lb 10.3 oz (57.9 kg)] 127 lb 10.3 oz (57.9 kg) (10/03 0044) Last BM Date: 07/28/11  Intake/Output from previous day: 10/02 0701 - 10/03 0700 In: 3178 [P.O.:740; I.V.:2438] Out: 50 [Emesis/NG output:50] Intake/Output this shift:    General appearance: alert, appears stated age and paleAlert. Skin warm and dry.  Pale.  Oral mucosa is moist.  . Thyroid not enlarged. No cervical lymphadenopathy. Lungs :bilateral wheezes. Marland Kitchen Heart regular rate and rhythm.  Abdomen is soft. Bowel sounds are positive.  No tenderness.  No edema to lower extremities   Lab Results:  Basename 07/28/11 0536 07/27/11 0530 07/27/11 0023 07/26/11 1303  WBC 9.0 12.7* -- 9.2  HGB 10.7* 11.5* 12.7 --  HCT 32.3* 34.7* 37.7 --  PLT 189 215 -- 251   BMET  Basename 07/28/11 0536 07/27/11 0530 07/26/11 1303  NA 136 138 140  K 3.5 4.3 4.5  CL 100 102 96  CO2 28 28 30   GLUCOSE 93 118* 179*  BUN 18 28* 17  CREATININE 0.68 0.97 0.80  CALCIUM 9.1 9.1 10.4   LFT  Basename 07/28/11 0536  PROT 6.0  ALBUMIN 2.9*  AST 17  ALT <5  ALKPHOS 56  BILITOT 0.3  BILIDIR 0.1  IBILI 0.2*   PT/INR  Basename 07/27/11 0530  LABPROT 14.9  INR 1.15   Hepatitis Panel No results found for this basename: HEPBSAG,HCVAB,HEPAIGM,HEPBIGM in the last 72  hours C-Diff No results found for this basename: CDIFFTOX:3 in the last 72 hours Fecal Lactopherrin No results found for this basename: FECLLACTOFRN in the last 72 hours  Studies/Results: Dg Abd Acute W/chest  07/26/2011  *RADIOLOGY REPORT*  Clinical Data: Vomiting, weakness, recent GI bleed and pulmonary embolism  ACUTE ABDOMEN SERIES (ABDOMEN 2 VIEW & CHEST 1 VIEW)  Comparison: Chest radiograph 06/15/2011, abdominal radiograph 06/14/2011  Findings: Enlargement of cardiac silhouette. Very large hiatal hernia with air-fluid level. Mediastinal contours pulmonary vascularity normal. Minimal bibasilar atelectasis. Lungs otherwise clear. Bones demineralized. IVC filter noted. Prominent stool in rectum. Paucity of bowel gas. No gross evidence of bowel obstruction or free air. Dextroconvex thoracolumbar scoliosis, pronounced, with multilevel degenerative disc and facet disease changes of the spine. No definite urinary tract calcification.  IMPRESSION: Slightly prominent stool in rectum. Very large hiatal hernia. Minimal bibasilar atelectasis. No additional significant abdominal findings.  Original Report Authenticated By: Lollie Marrow, M.D.    Medications: I have reviewed the patient's current medications.  Assessment/Plan:   Nausea and vomiting. No hematemesis. Her stool was positive for blood.  Will continue to monitor her H and H. Keep on clear liquids for now.  I will discuss this case with Dr. Karilyn Cota.   Nausea  LOS: 2 days  Houa Nie W 07/28/2011, 8:52 AM

## 2011-07-28 NOTE — Progress Notes (Signed)
Patient has not been able to keep liquids down. She has spitting up saliva; She also complains of difficulty swallowing liquids; while she has not experienced melena her stool now is heme positive ; hemoglobin this morning was 10.7. Her LFTs are normal except serum albumin of 2.9. Condition reviewed with patient's family and proceed with placement of NG tube; 12 French NG tube was placed via left nares without any difficulty with the return of 200 ml of brownish liquid; abdominal film was obtained. Tube is coiled up in hiatal hernia and tip may be in the stomach. Continue NG suction during the night and consider Gastrografin study in a.m. Increase her pantoprazole to 40 mg IV every 12 hours.

## 2011-07-28 NOTE — Progress Notes (Signed)
UR Chart Review Completed  

## 2011-07-29 ENCOUNTER — Encounter (HOSPITAL_COMMUNITY)
Admission: EM | Disposition: A | Discharge status: Skilled Nursing Facility | Payer: Self-pay | Source: Home / Self Care | Attending: Internal Medicine

## 2011-07-29 DIAGNOSIS — K449 Diaphragmatic hernia without obstruction or gangrene: Secondary | ICD-10-CM

## 2011-07-29 DIAGNOSIS — K2210 Ulcer of esophagus without bleeding: Secondary | ICD-10-CM

## 2011-07-29 HISTORY — PX: ESOPHAGOGASTRODUODENOSCOPY: SHX5428

## 2011-07-29 LAB — BASIC METABOLIC PANEL
Chloride: 106 mEq/L (ref 96–112)
GFR calc Af Amer: >90 mL/min (ref >90)
GFR calc non Af Amer: 80 mL/min — ABNORMAL LOW (ref >90)
Glucose, Bld: 91 mg/dL (ref 70–99)
Potassium: 3.3 mEq/L — ABNORMAL LOW (ref 3.5–5.1)
Sodium: 141 mEq/L (ref 135–145)

## 2011-07-29 LAB — CBC
Hemoglobin: 10 g/dL — ABNORMAL LOW (ref 12.0–15.0)
MCHC: 33.8 g/dL (ref 30.0–36.0)
WBC: 5.7 10*3/uL (ref 4.0–10.5)

## 2011-07-29 SURGERY — EGD (ESOPHAGOGASTRODUODENOSCOPY)
Anesthesia: Moderate Sedation

## 2011-07-29 MED ORDER — POTASSIUM CHLORIDE IN NACL 20-0.9 MEQ/L-% IV SOLN
INTRAVENOUS | Status: DC
  Administered 2011-07-29 (×2): 1000 mL via INTRAVENOUS

## 2011-07-29 MED ORDER — MIDAZOLAM HCL 5 MG/5ML IJ SOLN
INTRAMUSCULAR | Status: DC | PRN
  Administered 2011-07-29 (×2): 1 mg via INTRAVENOUS

## 2011-07-29 MED ORDER — MEPERIDINE HCL 50 MG/ML IJ SOLN
INTRAMUSCULAR | Status: AC
  Filled 2011-07-29: qty 1

## 2011-07-29 MED ORDER — MIDAZOLAM HCL 5 MG/5ML IJ SOLN
INTRAMUSCULAR | Status: AC
  Filled 2011-07-29: qty 10

## 2011-07-29 MED ORDER — BUTAMBEN-TETRACAINE-BENZOCAINE 2-2-14 % EX AERO
INHALATION_SPRAY | CUTANEOUS | Status: DC | PRN
  Administered 2011-07-29: 2 via TOPICAL

## 2011-07-29 MED ORDER — MEPERIDINE HCL 25 MG/ML IJ SOLN
INTRAMUSCULAR | Status: DC | PRN
  Administered 2011-07-29: 10 mg via INTRAVENOUS

## 2011-07-29 MED ORDER — SODIUM CHLORIDE 0.45 % IV SOLN
Freq: Once | INTRAVENOUS | Status: AC
  Administered 2011-07-29: 15:00:00 via INTRAVENOUS

## 2011-07-29 NOTE — Op Note (Signed)
EGD PROCEDURE REPORT  PATIENT:  [name]  MR#:  [id number] Birthdate:  [birth year], 77 y.o., female Endoscopist:  Dr. Malissa Hippo, MD Referred By:  Dr. Oneal Deputy. Juanetta Gosling M.D. Procedure Date: 07/29/2011  Procedure:   EGD  Indications:  Patient is 77 year old Caucasian female who has a recurrent nausea vomiting and dysphagia. She was hospitalized in August with upper GI bleed and had large hiatal hernia; ulcerative esophagitis and 2 small gastric ulcers. 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 and other family members. Informed consent was obtained from the patient the .  Medications:  Demerol 10 mg IV Versed 2 mg IV Cetacaine spray topically for oropharyngeal anesthesia  Description of procedure:  The endoscope was introduced through the mouth and advanced to the second portion of the duodenum without difficulty or limitations. The mucosal surfaces were surveyed very carefully during advancement of the scope and upon withdrawal.  Findings:  Esophagus: Linear ulcers noted to esophageal mucosa at body GEJ:  30 cm ulceration also noted at GE junction. Hiatus:  38 cm. large hernia filled with fluid and solid food debris. 500 ml of brown liquid was suctioned out. Stomach: The stomach that was above the diaphragm revealed erythematous friable mucosa without bleeding. Bloating channel was patent. 2 antral erosions felt to be residual of previously identified gastric ulcers. Duodenum:  Normal bulbar and post bulbar mucosa  Therapeutic/Diagnostic Maneuvers Performed:  500 ml of brownish liquid was suctioned out from the herniated part of the stomach  Complications:  None  Impression: Ulcerative reflux esophagitis which has not improved since his last EGD. Large hiatal hernia filled with fluid and some  food debris. 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 Antral ulcers have healed.  Recommendations:  N.p.o. except ice chips and by mouth meds. Consider surgical consultation for possible laparoscopic PEG placement with reduction of hernia as much as possible.  Will review options with the patient and she is alert.  [name],NAJEEB U  07/29/2011  4:48 PM  CC: Dr. Newman Nip.D.

## 2011-07-29 NOTE — Progress Notes (Signed)
  Family member in room.  Patient lying quietly in bed. Denies pain. She has not had po intake since yesterday at 12 Noon.  Family member now giving her sips of water.  H and H1 10.0 and 29.6.  We will continue to monitor her for her nausea and vomiting.

## 2011-07-29 NOTE — Progress Notes (Signed)
Patient pulled NGT out around 2 am; able to keep sips of water down; Suggest EGD later today; patient and daughter [name] are agreeable. Lab noted; hgb 10 gm; K 3.3 mEq/l

## 2011-07-29 NOTE — Progress Notes (Signed)
Subjective: She had an NG tube placed yesterday but it out because it was uncomfortable. She says she's doing better and is not nauseated right now but she's not had any oral fluids or food for about 24 hours. She is taking some sips of water down.  Objective: Vital signs in last 24 hours: Temp:  [98.2 F (36.8 C)-98.8 F (37.1 C)] 98.5 F (36.9 C) (10/04 0615) Pulse Rate:  [79-88] 79  (10/04 0615) Resp:  [18-20] 18  (10/04 0615) BP: (147-170)/(80-92) 170/92 mmHg (10/04 0615) SpO2:  [97 %-100 %] 100 % (10/04 0615) Weight:  [57.2 kg (126 lb 1.7 oz)] 126 lb 1.7 oz (57.2 kg) (10/04 0615) Weight change: -0.7 kg (-1 lb 8.7 oz) Last BM Date: 07/29/11  Intake/Output from previous day: 10/03 0701 - 10/04 0700 In: 1455 [P.O.:480; I.V.:965; IV Piggyback:10] Out: 575 [Emesis/NG output:575]  PHYSICAL EXAM General appearance: alert, cooperative and moderate distress Resp: clear to auscultation bilaterally Cardio: regular rate and rhythm, S1, S2 normal, no murmur, click, rub or gallop GI: soft, non-tender; bowel sounds normal; no masses,  no organomegaly Extremities: extremities normal, atraumatic, no cyanosis or edema  Lab Results:    Basic Metabolic Panel:  Basename 07/29/11 0417 07/28/11 0536  NA 141 136  K 3.3* 3.5  CL 106 100  CO2 29 28  GLUCOSE 91 93  BUN 9 18  CREATININE 0.61 0.68  CALCIUM 9.1 9.1  MG -- --  PHOS -- --   Liver Function Tests:  Basename 07/28/11 0536  AST 17  ALT <5  ALKPHOS 56  BILITOT 0.3  PROT 6.0  ALBUMIN 2.9*   No results found for this basename: LIPASE:2,AMYLASE:2 in the last 72 hours No results found for this basename: AMMONIA:2 in the last 72 hours CBC:  Basename 07/29/11 0417 07/28/11 0536 07/26/11 1303  WBC 5.7 9.0 --  NEUTROABS -- 6.3 8.3*  HGB 10.0* 10.7* --  HCT 29.6* 32.3* --  MCV 94.3 95.6 --  PLT 166 189 --   Cardiac Enzymes: No results found for this basename: CKTOTAL:3,CKMB:3,CKMBINDEX:3,TROPONINI:3 in the last 72  hours BNP: No results found for this basename: POCBNP:3 in the last 72 hours D-Dimer: No results found for this basename: DDIMER:2 in the last 72 hours CBG: No results found for this basename: GLUCAP:6 in the last 72 hours Hemoglobin A1C: No results found for this basename: HGBA1C in the last 72 hours Fasting Lipid Panel: No results found for this basename: CHOL,HDL,LDLCALC,TRIG,CHOLHDL,LDLDIRECT in the last 72 hours Thyroid Function Tests: No results found for this basename: TSH,T4TOTAL,FREET4,T3FREE,THYROIDAB in the last 72 hours Anemia Panel: No results found for this basename: VITAMINB12,FOLATE,FERRITIN,TIBC,IRON,RETICCTPCT in the last 72 hours Urine Drug Screen:  Alcohol Level: No results found for this basename: ETH:2 in the last 72 hours Urinalysis:  Misc. Labs:  ABGS No results found for this basename: PHART,PCO2,PO2ART,TCO2,HCO3 in the last 72 hours CULTURES Recent Results (from the past 240 hour(s))  URINE CULTURE     Status: Normal   Collection Time   07/26/11  2:38 PM      Component Value Range Status Comment   Specimen Description URINE, CATHETERIZED   Final    Special Requests NONE   Final    Setup Time [id number]   Final    Colony Count NO GROWTH   Final    Culture NO GROWTH   Final    Report Status 07/28/2011 FINAL   Final    Studies/Results: Dg Abd 1 View Portable Kub Please  07/28/2011  *  RADIOLOGY REPORT*  Clinical Data: Nasogastric tube placement.  ABDOMEN - 1 VIEW  Comparison: 07/26/2011.  Findings: Nasogastric tube is present, with redundant loops and a large hiatal hernia.  The tube does not extend into the abdomen proper.  IVC filter is present.  Thoracolumbar scoliosis and spondylosis.  Bilateral pleural effusions are present.  IMPRESSION: Nasogastric tube coiled within hiatal hernia.  The nasogastric tube does not enter the abdominal portion of the stomach.  Original Report Authenticated By: Andreas Newport, M.D.    Medications:  Scheduled:   .  amitriptyline  10 mg Oral QHS  . carbidopa-levodopa  2 tablet Oral TID  . mirtazapine  15 mg Oral QHS  . pantoprazole (PROTONIX) IV  40 mg Intravenous Q12H  . PARoxetine  10 mg Oral Daily  . polyethylene glycol  17 g Oral Daily  . sodium chloride      . sodium chloride      . DISCONTD: pantoprazole (PROTONIX) IV  40 mg Intravenous Q24H   Continuous:   . sodium chloride 50 mL/hr at 07/26/11 1238  . sodium chloride    . sodium chloride    . sodium chloride 1,000 mL (07/28/11 1020)   YQI:[id number], acetaminophen, alum & mag hydroxide-simeth, ondansetron (ZOFRAN) IV, ondansetron  Assesment: She has nausea and vomiting. She has severe peptic ulcer disease. She had a pulmonary embolus and has a vena cava filter in place. Active Problems:  * No active hospital problems. *     Plan: No change in her treatments she'll try the sense of water. When I discussed her situation with her gastroenterologist yesterday there was some question that if she does not improve she may need to be transferred to a tertiary care center. I discussed that with the patient and her family briefly this morning    LOS: 3 days   Verneda Hollopeter L 07/29/2011, 9:05 AM

## 2011-07-29 NOTE — Progress Notes (Signed)
Patient pulled out her NG tube and stated it was too uncomfortable for her to sleep.  Dr. Karilyn Cota notified and order received to leave NG tube out.

## 2011-07-30 ENCOUNTER — Encounter (HOSPITAL_COMMUNITY): Payer: Medicare Other

## 2011-07-30 ENCOUNTER — Inpatient Hospital Stay (HOSPITAL_COMMUNITY): Payer: Medicare Other

## 2011-07-30 LAB — BASIC METABOLIC PANEL
BUN: 8 mg/dL (ref 6–23)
Calcium: 9.2 mg/dL (ref 8.4–10.5)
GFR calc Af Amer: >90 mL/min (ref >90)
GFR calc non Af Amer: 80 mL/min — ABNORMAL LOW (ref >90)
Glucose, Bld: 87 mg/dL (ref 70–99)
Potassium: 4.5 mEq/L (ref 3.5–5.1)
Sodium: 137 mEq/L (ref 135–145)

## 2011-07-30 MED ORDER — FAT EMULSION 20 % IV EMUL
250.0000 mL | INTRAVENOUS | Status: AC
  Administered 2011-07-30: 250 mL via INTRAVENOUS
  Filled 2011-07-30: qty 250

## 2011-07-30 MED ORDER — SODIUM CHLORIDE 0.9 % IJ SOLN
10.0000 mL | Freq: Two times a day (BID) | INTRAMUSCULAR | Status: DC
  Administered 2011-07-30 – 2011-08-05 (×14): 10 mL
  Administered 2011-08-06: 23:00:00
  Administered 2011-08-06 – 2011-08-18 (×18): 10 mL
  Filled 2011-07-30 (×26): qty 10

## 2011-07-30 MED ORDER — SODIUM CHLORIDE 0.9 % IJ SOLN
10.0000 mL | INTRAMUSCULAR | Status: DC | PRN
  Administered 2011-08-16: 10 mL
  Filled 2011-07-30 (×5): qty 10

## 2011-07-30 MED ORDER — KCL IN DEXTROSE-NACL 20-5-0.9 MEQ/L-%-% IV SOLN
INTRAVENOUS | Status: AC
  Administered 2011-07-30: 18:00:00 via INTRAVENOUS

## 2011-07-30 MED ORDER — TRACE MINERALS CR-CU-MN-SE-ZN 10-1000-500-60 MCG/ML IV SOLN
INTRAVENOUS | Status: AC
  Administered 2011-07-30: 17:00:00 via INTRAVENOUS
  Filled 2011-07-30: qty 2000

## 2011-07-30 MED ORDER — KCL IN DEXTROSE-NACL 20-5-0.9 MEQ/L-%-% IV SOLN
INTRAVENOUS | Status: AC
  Administered 2011-07-30: 10:00:00 via INTRAVENOUS

## 2011-07-30 NOTE — Progress Notes (Signed)
PARENTERAL NUTRITION CONSULT NOTE - INITIAL  Pharmacy Consult for TPN Indication: malnutrition  Allergies  Allergen Reactions  . Metoclopramide Hcl     Patient Measurements: Height: 4\' 8"  (142.2 cm) Weight: 132 lb 7.9 oz (60.1 kg) IBW/kg (Calculated) : 36.3  Adjusted Body Weight:n/a    Vital Signs: Temp: 98.3 F (36.8 C) (10/05 0535) BP: 148/80 mmHg (10/05 0535) Pulse Rate: 77  (10/05 0535) Intake/Output from previous day: 10/04 0701 - 10/05 0700 In: 1237.5 [I.V.:1237.5] Out: -  Intake/Output from this shift:    Labs:  [facility] 07/29/11 0417 07/28/11 0536  WBC 5.7 9.0  HGB 10.0* 10.7*  HCT 29.6* 32.3*  PLT 166 189  APTT -- --  INR -- --     Basename 07/30/11 1245 07/29/11 0417 07/28/11 0536  NA 137 141 136  K 4.5 3.3* 3.5  CL 101 106 100  CO2 28 29 28   GLUCOSE 87 91 93  BUN 8 9 18   CREATININE 0.60 0.61 0.68  LABCREA -- -- --  CREAT24HRUR -- -- --  CALCIUM 9.2 9.1 9.1  MG 1.4* -- --  PHOS 3.0 -- --  PROT -- -- 6.0  ALBUMIN -- -- 2.9*  AST -- -- 17  ALT -- -- <5  ALKPHOS -- -- 56  BILITOT -- -- 0.3  BILIDIR -- -- 0.1  IBILI -- -- 0.2*  PREALBUMIN -- -- --  CHOLHDL -- -- --  CHOL -- -- --   Estimated Creatinine Clearance: 36.5 ml/min (by C-G formula based on Cr of 0.6).     Medical History: Past Medical History  Diagnosis Date  . Hypertension   . GERD (gastroesophageal reflux disease)   . Parkinson disease   . Depression   . Pulmonary embolism     Medications:    Insulin Requirements in the past 24 hours:  None  Current Nutrition:  NPO  Assessment: Malnutrition  Nutritional Goals:  1500 kCal, 90 grams of protein per day. Await RD's input  Plan:  Begin TPN at 50 mls per hour (rate per Dr. Karilyn Cota). Fat infusion at 10 mls per hour. Check labs, CBG's and adjust as needed.  Gilman Buttner, [place] J 07/30/2011,2:17 PM

## 2011-07-30 NOTE — Progress Notes (Signed)
NAMEFLETCHER, RATHBUN                 ACCOUNT NO.:  [id number]  MEDICAL RECORD NO.:  [id number]  LOCATION:  A316                          FACILITY:  [facility]  PHYSICIAN:  Klee Kolek D. Felecia Shelling, MD   DATE OF BIRTH:  02/04/1923  DATE OF PROCEDURE:  07/30/2011 DATE OF DISCHARGE:                                PROGRESS NOTE   SUBJECTIVE:  The patient feels better.  She did have vomiting since last night.  OBJECTIVE:  GENERAL:  The patient is alert and awake, but sick looking. VITAL SIGNS:  Blood pressure 137/92, pulse 77, respiratory rate 18, temperature 98 degrees Fahrenheit. CHEST:  Clear lung fields, good air entry. CARDIOVASCULAR SYSTEM:  First and second heart sound heard.  No murmur. No gallop. ABDOMEN:  Soft and lax.  Bowel sounds positive.  No mass or organomegaly. EXTREMITIES:  No leg edema.  LABORATORY DATA:  WBC 5.7, hemoglobin 10.0, hematocrit 29.6, and platelet 166.  BMP, sodium 134, potassium 3.3, chloride 106, carbon dioxide 29, glucose 91, BUN 9, creatinine 0.6 and calcium 1.1.  ASSESSMENT: 1. Recurrent nausea and vomiting. 2. Peptic ulcer disease. 3. Ulcerative esophageal reflux. 4. Advanced Parkinson disease. 5. Protein calorie malnutrition.  PLAN:  We will continue the patient on IV fluids.  Further means of nutrition is being evaluated by GI and will follow GI recommendation.     Kary Sugrue D. Felecia Shelling, MD     TDF/MEDQ  D:  07/30/2011  T:  07/30/2011  Job:  [id number]

## 2011-07-30 NOTE — Progress Notes (Signed)
Patient has no complaints; she only had one episode of regurgitation. She is able to swallow pills with few sips of water. She is trying to lie on the right site. Patient presently is n.p.o. except for meds. As requested by her family members I did talk with Dr. Malvin Johns about her condition. States patient is too high risk and he has nothing to offer her. Have talked with patient's daughter  [name]; family does not want patient to be transferred to [facility]; he would like to get an opinion from Dr. Leticia Penna when he is back in town next. They also want to wait for Dr. [name] patient's primary care physician be involved in final decision making. Since patient is n.p.o. will proceed with placement of PICC line and start on TPN; pharmacy to assist with TPN.

## 2011-07-31 LAB — CBC
HCT: 34.7 % — ABNORMAL LOW (ref 36.0–46.0)
Hemoglobin: 11.7 g/dL — ABNORMAL LOW (ref 12.0–15.0)
MCV: 93.3 fL (ref 78.0–100.0)
Platelets: 171 10*3/uL (ref 150–400)
RBC: 3.72 MIL/uL — ABNORMAL LOW (ref 3.87–5.11)
WBC: 7.3 10*3/uL (ref 4.0–10.5)

## 2011-07-31 LAB — DIFFERENTIAL
Eosinophils Absolute: 0.6 10*3/uL (ref 0.0–0.7)
Lymphs Abs: 1 10*3/uL (ref 0.7–4.0)
Monocytes Relative: 11 % (ref 3–12)
Neutrophils Relative %: 67 % (ref 43–77)

## 2011-07-31 LAB — COMPREHENSIVE METABOLIC PANEL
AST: 13 U/L (ref 0–37)
BUN: 11 mg/dL (ref 6–23)
CO2: 32 mEq/L (ref 19–32)
Chloride: 95 mEq/L — ABNORMAL LOW (ref 96–112)
Creatinine, Ser: 0.66 mg/dL (ref 0.50–1.10)
GFR calc non Af Amer: 78 mL/min — ABNORMAL LOW (ref >90)
Total Bilirubin: 0.3 mg/dL (ref 0.3–1.2)

## 2011-07-31 LAB — MAGNESIUM: Magnesium: 1.3 mg/dL — ABNORMAL LOW (ref 1.5–2.5)

## 2011-07-31 MED ORDER — CLINIMIX E/DEXTROSE (5/15) 5 % IV SOLN
INTRAVENOUS | Status: AC
  Administered 2011-07-31: 18:00:00 via INTRAVENOUS
  Filled 2011-07-31: qty 2000

## 2011-07-31 MED ORDER — FAT EMULSION 20 % IV EMUL
250.0000 mL | INTRAVENOUS | Status: AC
  Administered 2011-07-31: 250 mL via INTRAVENOUS
  Filled 2011-07-31: qty 250

## 2011-07-31 NOTE — Progress Notes (Signed)
PARENTERAL NUTRITION CONSULT NOTE - FOLLOW UP  Pharmacy Consult for TPN  Indication: malnutrion  Allergies  Allergen Reactions  . Metoclopramide Hcl     Patient Measurements: Height: 4\' 8"  (142.2 cm) Weight: 128 lb 1.4 oz (58.1 kg) IBW/kg (Calculated) : 36.3    Vital Signs: Temp: 98 F (36.7 C) (10/06 0557) Temp src: Oral (10/06 0557) BP: 175/79 mmHg (10/06 0557) Pulse Rate: 75  (10/06 0557) Intake/Output from previous day: 10/05 0701 - 10/06 0700 In: 779 [TPN:779] Out: -  Intake/Output from this shift:    Labs:  Basename 07/31/11 0500 07/29/11 0417  WBC 7.3 5.7  HGB 11.7* 10.0*  HCT 34.7* 29.6*  PLT 171 166  APTT -- --  INR -- --     Basename 07/31/11 0500 07/30/11 1245 07/29/11 0417  NA -- 137 141  K -- 4.5 3.3*  CL -- 101 106  CO2 -- 28 29  GLUCOSE -- 87 91  BUN -- 8 9  CREATININE -- 0.60 0.61  LABCREA -- -- --  CREAT24HRUR -- -- --  CALCIUM -- 9.2 9.1  MG -- 1.4* --  PHOS -- 3.0 --  PROT -- -- --  ALBUMIN -- -- --  AST -- -- --  ALT -- -- --  ALKPHOS -- -- --  BILITOT -- -- --  BILIDIR -- -- --  IBILI -- -- --  PREALBUMIN -- -- --  CHOLHDL -- -- --  CHOL 161 -- --   Estimated Creatinine Clearance: 35.9 ml/min (by C-G formula based on Cr of 0.6).   No results found for this basename: GLUCAP:3 in the last 72 hours  Medications:  Scheduled:    . amitriptyline  10 mg Oral QHS  . carbidopa-levodopa  2 tablet Oral TID  . mirtazapine  15 mg Oral QHS  . pantoprazole (PROTONIX) IV  40 mg Intravenous Q12H  . PARoxetine  10 mg Oral Daily  . polyethylene glycol  17 g Oral Daily  . sodium chloride  10 mL Intracatheter Q12H    Insulin Requirements in the past 24 hours:  none  Current Nutrition:  NPO  Assessment: Malnutrion  Nutritional Goals:  1500 kCal, 90 grams of protein per day Await RD's input   Plan:  Continue TPN 50 ml per hour Fat infusion 10 ml per hour Check labs adjust as needed  Raquel James, Kelan Pritt  Bennett 07/31/2011,10:44 AM

## 2011-07-31 NOTE — Progress Notes (Signed)
Patient needs code status put in the computer please.  Thanks!

## 2011-07-31 NOTE — Progress Notes (Signed)
[name], [name]                 ACCOUNT NO.:  [id number]  MEDICAL RECORD NO.:  [id number]  LOCATION:  A316                          FACILITY:  [facility]  PHYSICIAN:  Lyndi Holbein D. Felecia Shelling, MD   DATE OF BIRTH:  13-Dec-1922  DATE OF PROCEDURE: DATE OF DISCHARGE:                                PROGRESS NOTE   SUBJECTIVE:  The patient is complaining of nausea.  She is still n.p.o. The patient is started on TPN.  OBJECTIVE:  GENERAL:  The patient is alert, awake, chronically sick looking. VITAL SIGNS:  Blood pressure 166/84, pulse 75, respiratory rate 20, temperature 98 degrees Fahrenheit. CHEST:  Decreased air entry, few rhonchi. CARDIOVASCULAR SYSTEM:  First and second heart sound heard.  No murmur. No gallop. ABDOMEN:  Soft and lax.  Bowel sounds positive.  No mass or organomegaly. EXTREMITIES:  No leg edema.  LABORATORY DATA:  CBC:  WBC 7.3, hemoglobin 11.7, hematocrit 34.7, platelet 171,000.  BMP:  Results pending.  ASSESSMENT: 1. Recurrent nausea and vomiting secondary to multifactorial causes. 2. Esophageal reflux with ulceration. 3. Peptic ulcer disease. 4. Advanced Parkinson's disease. 5. Protein-calorie malnutrition.  PLAN:  We will continue the patient on TPN as recommended by GI.  We will continue to monitor her BMP.  Continue current treatment and supportive care.     Jennefer Kopp D. Felecia Shelling, MD     TDF/MEDQ  D:  07/31/2011  T:  07/31/2011  Job:  [id number]

## 2011-08-01 LAB — BASIC METABOLIC PANEL
BUN: 16 mg/dL (ref 6–23)
CO2: 33 mEq/L — ABNORMAL HIGH (ref 19–32)
Chloride: 97 mEq/L (ref 96–112)
Glucose, Bld: 140 mg/dL — ABNORMAL HIGH (ref 70–99)
Potassium: 3.4 mEq/L — ABNORMAL LOW (ref 3.5–5.1)

## 2011-08-01 MED ORDER — INSULIN ASPART 100 UNIT/ML ~~LOC~~ SOLN
0.0000 [IU] | SUBCUTANEOUS | Status: DC
  Administered 2011-08-01: 2 [IU] via SUBCUTANEOUS
  Administered 2011-08-01 – 2011-08-03 (×13): 1 [IU] via SUBCUTANEOUS
  Administered 2011-08-03: 2 [IU] via SUBCUTANEOUS
  Administered 2011-08-04 – 2011-08-06 (×7): 1 [IU] via SUBCUTANEOUS
  Administered 2011-08-06: 2 [IU] via SUBCUTANEOUS
  Administered 2011-08-06 (×2): 1 [IU] via SUBCUTANEOUS
  Filled 2011-08-01: qty 3

## 2011-08-01 MED ORDER — CLINIMIX E/DEXTROSE (5/15) 5 % IV SOLN
INTRAVENOUS | Status: AC
  Filled 2011-08-01: qty 2000

## 2011-08-01 MED ORDER — FAT EMULSION 20 % IV EMUL
250.0000 mL | INTRAVENOUS | Status: AC
  Filled 2011-08-01: qty 250

## 2011-08-01 MED ORDER — POTASSIUM CHLORIDE 10 MEQ/100ML IV SOLN
10.0000 meq | INTRAVENOUS | Status: AC
  Administered 2011-08-01 (×3): 10 meq via INTRAVENOUS
  Filled 2011-08-01: qty 300

## 2011-08-01 NOTE — Progress Notes (Signed)
PARENTERAL NUTRITION CONSULT NOTE - FOLLOW UP  Pharmacy Consult for TPN  Indication: malnutrion  Allergies  Allergen Reactions  . Metoclopramide Hcl     Patient Measurements: Height: 4\' 8"  (142.2 cm) Weight: 123 lb 10.9 oz (56.1 kg) IBW/kg (Calculated) : 36.3    Vital Signs: Temp: 98 F (36.7 C) (10/07 0729) Temp src: Oral (10/07 0729) BP: 149/89 mmHg (10/07 0729) Pulse Rate: 82  (10/07 0729) Intake/Output from previous day:   Intake/Output from this shift:    Labs:  Basename 07/31/11 0500  WBC 7.3  HGB 11.7*  HCT 34.7*  PLT 171  APTT --  INR --     Basename 08/01/11 0431 07/31/11 0500 07/30/11 1245  NA 137 135 137  K 3.4* 3.5 4.5  CL 97 95* 101  CO2 33* 32 28  GLUCOSE 140* 176* 87  BUN 16 11 8   CREATININE 0.56 0.66 0.60  LABCREA -- -- --  CREAT24HRUR -- -- --  CALCIUM 9.5 9.1 9.2  MG -- 1.3* 1.4*  PHOS -- 3.2 3.0  PROT -- 6.3 --  ALBUMIN -- 3.0* --  AST -- 13 --  ALT -- 2 --  ALKPHOS -- 55 --  BILITOT -- 0.3 --  BILIDIR -- -- --  IBILI -- -- --  PREALBUMIN -- 13.0* --  CHOLHDL -- -- --  CHOL -- 161 --   Estimated Creatinine Clearance: 35.2 ml/min (by C-G formula based on Cr of 0.56).   No results found for this basename: GLUCAP:3 in the last 72 hours  Medications:  Scheduled:     . amitriptyline  10 mg Oral QHS  . carbidopa-levodopa  2 tablet Oral TID  . insulin aspart  0-9 Units Subcutaneous Q4H  . mirtazapine  15 mg Oral QHS  . pantoprazole (PROTONIX) IV  40 mg Intravenous Q12H  . PARoxetine  10 mg Oral Daily  . polyethylene glycol  17 g Oral Daily  . potassium chloride  10 mEq Intravenous Q1 Hr x 3  . sodium chloride  10 mL Intracatheter Q12H    Insulin Requirements in the past 24 hours:  Added sensitive sliding scale to cover increasing glucose  Current Nutrition:  NPO  Assessment: Malnutrion  Nutritional Goals:  1500 kCal, 90 grams of protein per day Await RD's input   Plan:  Added sensitive sliding scale insulin  every 4 hours per protocol Potassium chloride 10 mEq IV x 3 doses due to K level this AM per protocol Continue TPN 50 ml per hour Fat infusion 10 ml per hour Check labs adjust as needed  Raquel James, Frans Valente Bennett 08/01/2011,7:36 AM

## 2011-08-02 LAB — COMPREHENSIVE METABOLIC PANEL
Alkaline Phosphatase: 54 U/L (ref 39–117)
BUN: 20 mg/dL (ref 6–23)
Creatinine, Ser: 0.58 mg/dL (ref 0.50–1.10)
GFR calc Af Amer: >90 mL/min (ref >90)
Glucose, Bld: 123 mg/dL — ABNORMAL HIGH (ref 70–99)
Potassium: 3.9 mEq/L (ref 3.5–5.1)
Total Bilirubin: 0.3 mg/dL (ref 0.3–1.2)
Total Protein: 5.6 g/dL — ABNORMAL LOW (ref 6.0–8.3)

## 2011-08-02 LAB — GLUCOSE, CAPILLARY
Glucose-Capillary: 130 mg/dL — ABNORMAL HIGH (ref 70–99)
Glucose-Capillary: 133 mg/dL — ABNORMAL HIGH (ref 70–99)
Glucose-Capillary: 133 mg/dL — ABNORMAL HIGH (ref 70–99)

## 2011-08-02 LAB — DIFFERENTIAL
Basophils Absolute: 0 10*3/uL (ref 0.0–0.1)
Basophils Relative: 1 % (ref 0–1)
Eosinophils Absolute: 0.6 10*3/uL (ref 0.0–0.7)
Neutrophils Relative %: 55 % (ref 43–77)

## 2011-08-02 LAB — CBC
MCH: 31.6 pg (ref 26.0–34.0)
MCHC: 33.3 g/dL (ref 30.0–36.0)
Platelets: 145 10*3/uL — ABNORMAL LOW (ref 150–400)
RBC: 3.32 MIL/uL — ABNORMAL LOW (ref 3.87–5.11)

## 2011-08-02 LAB — MAGNESIUM: Magnesium: 1.6 mg/dL (ref 1.5–2.5)

## 2011-08-02 LAB — TRIGLYCERIDES: Triglycerides: 114 mg/dL (ref <150)

## 2011-08-02 LAB — CHOLESTEROL, TOTAL: Cholesterol: 131 mg/dL (ref 0–200)

## 2011-08-02 MED ORDER — PANTOPRAZOLE SODIUM 40 MG IV SOLR
40.0000 mg | Freq: Two times a day (BID) | INTRAVENOUS | Status: DC
  Administered 2011-08-02 – 2011-08-06 (×10): 40 mg via INTRAVENOUS
  Filled 2011-08-02 (×11): qty 40

## 2011-08-02 MED ORDER — CARBIDOPA-LEVODOPA 25-100 MG PO TABS
2.0000 | ORAL_TABLET | Freq: Three times a day (TID) | ORAL | Status: DC
  Administered 2011-08-02 – 2011-08-08 (×21): 2 via ORAL
  Filled 2011-08-02 (×28): qty 2

## 2011-08-02 MED ORDER — FAT EMULSION 20 % IV EMUL
250.0000 mL | INTRAVENOUS | Status: AC
  Administered 2011-08-02 – 2011-08-03 (×2): 250 mL via INTRAVENOUS
  Filled 2011-08-02 (×3): qty 250

## 2011-08-02 MED ORDER — TRACE MINERALS CR-CU-MN-SE-ZN 10-1000-500-60 MCG/ML IV SOLN
INTRAVENOUS | Status: AC
  Administered 2011-08-02: 18:00:00 via INTRAVENOUS
  Filled 2011-08-02 (×2): qty 2000

## 2011-08-02 MED ORDER — PANTOPRAZOLE SODIUM 40 MG PO TBEC: 40.0000 mg | DELAYED_RELEASE_TABLET | Freq: Two times a day (BID) | ORAL | Status: DC

## 2011-08-02 NOTE — Progress Notes (Signed)
PARENTERAL NUTRITION CONSULT NOTE - FOLLOW UP  Pharmacy Consult for TPN  Indication: malnutrion  Allergies  Allergen Reactions  . Metoclopramide Hcl     Patient Measurements: Height: 4\' 8"  (142.2 cm) Weight: 123 lb 10.9 oz (56.1 kg) IBW/kg (Calculated) : 36.3    Vital Signs: Temp: 98 F (36.7 C) (10/08 0553) Temp src: Axillary (10/08 0553) BP: 120/66 mmHg (10/08 0553) Pulse Rate: 71  (10/08 0553) Intake/Output from previous day:   Intake/Output from this shift:    Labs:  Basename 08/02/11 0501 07/31/11 0500  WBC 5.6 7.3  HGB 10.5* 11.7*  HCT 31.5* 34.7*  PLT 145* 171  APTT -- --  INR -- --     Basename 08/02/11 0501 08/01/11 0431 07/31/11 0500 07/30/11 1245  NA 136 137 135 --  K 3.9 3.4* 3.5 --  CL 98 97 95* --  CO2 32 33* 32 --  GLUCOSE 123* 140* 176* --  BUN 20 16 11  --  CREATININE 0.58 0.56 0.66 --  LABCREA -- -- -- --  CREAT24HRUR -- -- -- --  CALCIUM 9.4 9.5 9.1 --  MG 1.6 -- 1.3* 1.4*  PHOS 3.9 -- 3.2 3.0  PROT 5.6* -- 6.3 --  ALBUMIN 2.6* -- 3.0* --  AST 9 -- 13 --  ALT <5 -- 2 --  ALKPHOS 54 -- 55 --  BILITOT 0.3 -- 0.3 --  BILIDIR -- -- -- --  IBILI -- -- -- --  PREALBUMIN -- -- 13.0* --  CHOLHDL -- -- -- --  CHOL 131 -- 161 --   Estimated Creatinine Clearance: 35.2 ml/min (by C-G formula based on Cr of 0.58).    Basename 08/02/11 0723 08/02/11 0406 08/02/11 0002  GLUCAP 133* 130* 137*    Medications:  Scheduled:     . amitriptyline  10 mg Oral QHS  . carbidopa-levodopa  2 tablet Oral TID  . insulin aspart  0-9 Units Subcutaneous Q4H  . mirtazapine  15 mg Oral QHS  . pantoprazole (PROTONIX) IV  40 mg Intravenous Q12H  . PARoxetine  10 mg Oral Daily  . polyethylene glycol  17 g Oral Daily  . potassium chloride  10 mEq Intravenous Q1 Hr x 3  . sodium chloride  10 mL Intracatheter Q12H    Insulin Requirements in the past 24 hours:  Added sensitive sliding scale to cover increasing glucose  Current Nutrition:   NPO  Assessment: Malnutrion  Nutritional Goals:  1500 kCal, 90 grams of protein per day     Plan:  Added sensitive sliding scale insulin every 4 hours per protocol Increase TPN to 70 ml per hour (goal rate) Fat infusion 10 ml per hour Check labs adjust as needed  Tomi Bamberger J 08/02/2011,8:14 AM

## 2011-08-02 NOTE — Progress Notes (Signed)
Patient denies heartburn regurgitation or abdominal pain. She states she is hungry and would like to eat. She has been tolerating TPN without any side effects She is afebrile Her abdominal examination is normal Her hemoglobin is 10.5 and hematocrit 31.5. 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 Dr. [name] has seen the patient is moaning and not talk with him about treatment options. Patient's family is not at bedside. We will give another child with full liquids and asked that she stays upright in a chair or lie on the right side. We'll talk with the patient's family members later today.

## 2011-08-02 NOTE — Progress Notes (Signed)
[name], [name]                 ACCOUNT NO.:  [id number]  MEDICAL RECORD NO.:  [id number]  LOCATION:  A316                          FACILITY:  [facility]  PHYSICIAN:  Xerxes Agrusa D. Felecia Shelling, MD   DATE OF BIRTH:  February 01, 1923  DATE OF PROCEDURE:  08/01/2011 DATE OF DISCHARGE:                                PROGRESS NOTE   SUBJECTIVE:  The patient had no vomiting or nausea.  She overall does not feel well.  No fever or chills.  OBJECTIVE:  GENERAL:  The patient is alert, awake, and chronically sick looking.  She is on TPN. VITAL SIGNS:  Blood pressure 156/85, pulse 73, respiratory rate 18, temperature 97.7 degrees Fahrenheit. CHEST:  Decreased air entry.  Few rhonchi. CARDIOVASCULAR:  First and second heart sounds heard.  No murmur.  No gallop. ABDOMEN:  Soft and lax.  Bowel sounds positive.  No masses or organomegaly. EXTREMITIES:  No leg edema.  LABORATORY DATA:  Sodium 137, potassium 3.4, chloride 97, carbon dioxide 33, glucose 144, BUN 16, creatinine 0.6, calcium 9.5.  ASSESSMENT: 1. Dysphagia. 2. History of peptic ulcer disease. 3. Esophageal reflux with ulceration. 4. Protein-calorie malnutrition.  PLAN:  Continue the patient on TPN as recommended by GI.  We will continue to monitor her electrolytes.  Continue supportive care.     Tyreesha Maharaj D. Felecia Shelling, MD     TDF/MEDQ  D:  08/01/2011  T:  08/01/2011  Job:  [id number]

## 2011-08-02 NOTE — Progress Notes (Signed)
INITIAL ADULT NUTRITION ASSESSMENT Date: 08/02/2011   Time: 10:56 AM Reason for Assessment: TPN initiation  ASSESSMENT: Female 77 y.o.  Dx: <principal problem not specified>-Vomiting    Past Medical History  Diagnosis Date  . Hypertension   . GERD (gastroesophageal reflux disease)   . Parkinson disease   . Depression   . Pulmonary embolism     Scheduled Meds:   . amitriptyline  10 mg Oral QHS  . carbidopa-levodopa  2 tablet Oral TID  . insulin aspart  0-9 Units Subcutaneous Q4H  . mirtazapine  15 mg Oral QHS  . pantoprazole (PROTONIX) IV  40 mg Intravenous Q12H  . PARoxetine  10 mg Oral Daily  . polyethylene glycol  17 g Oral Daily  . potassium chloride  10 mEq Intravenous Q1 Hr x 3  . sodium chloride  10 mL Intracatheter Q12H  . DISCONTD: carbidopa-levodopa  2 tablet Oral TID  . DISCONTD: pantoprazole  40 mg Oral BID AC  . DISCONTD: pantoprazole (PROTONIX) IV  40 mg Intravenous Q12H   Continuous Infusions:   . TPN (CLINIMIX) +/- additives 50 mL/hr at 07/31/11 1800   And  . fat emulsion 250 mL (07/31/11 1800)  . TPN (CLINIMIX) +/- additives     And  . fat emulsion    . TPN (CLINIMIX) +/- additives     And  . fat emulsion     PRN Meds:.acetaminophen, acetaminophen, alum & mag hydroxide-simeth, ondansetron (ZOFRAN) IV, ondansetron, sodium chloride  Ht: 4\' 8"  (142.2 cm)  Wt: 123 lb 10.9 oz (56.1 kg)  Ideal Wt: 36.3 kg 40.8 kg % Ideal Wt: 157%  Usual Wt: 120-129# % Usual Wt: 100% Weight change:   Body mass index is 27.73 kg/(m^2).  Food/Nutrition Related Hx: Pt up in chair reports no nausea currently. BM this am. Skin intact. No recent significant wt changes. She reports decr appetite, N/V 24 hr prior to admission.Pt diet adv to full liquids now and tol.I suspect pt is malnourished given her inadequate oral intake >7d. However, no significant wt change or edema noted. TPN has been initiated and adv today to goal rate of 70 ml/hr which is adequate to meet est  energy and protein needs.     BMET    Component Value Date/Time   NA 136 08/02/2011 0501   K 3.9 08/02/2011 0501   CL 98 08/02/2011 0501   CO2 32 08/02/2011 0501   GLUCOSE 123* 08/02/2011 0501   BUN 20 08/02/2011 0501   CREATININE 0.58 08/02/2011 0501   CALCIUM 9.4 08/02/2011 0501   GFRNONAA 81* 08/02/2011 0501   GFRAA >90 08/02/2011 0501   CBC    Component Value Date/Time   WBC 5.6 08/02/2011 0501   RBC 3.32* 08/02/2011 0501   HGB 10.5* 08/02/2011 0501   HCT 31.5* 08/02/2011 0501   PLT 145* 08/02/2011 0501   MCV 94.9 08/02/2011 0501   MCH 31.6 08/02/2011 0501   MCHC 33.3 08/02/2011 0501   RDW 14.6 08/02/2011 0501   LYMPHSABS 1.0 08/02/2011 0501   MONOABS 0.9 08/02/2011 0501   EOSABS 0.6 08/02/2011 0501   BASOSABS 0.0 08/02/2011 0501   CBG (last 3)   Basename 08/02/11 0723 08/02/11 0406 08/02/11 0002  GLUCAP 133* 130* 137*    Intake/Output Summary (Last 24 hours) at 08/02/11 1100 Last data filed at 08/01/11 1300  Gross per 24 hour  Intake      0 ml  Output      0 ml  Net  0 ml      Diet Order: Full Liquid  Supplements/Tube Feeding:TPN  IVF:    TPN (CLINIMIX) +/- additives Last Rate: 50 mL/hr at 07/31/11 1800  And   fat emulsion Last Rate: 250 mL (07/31/11 1800)  TPN (CLINIMIX) +/- additives   And   fat emulsion   TPN (CLINIMIX) +/- additives   And   fat emulsion     Estimated Nutritional Needs:   Kcal:1400-1680 Protein:84-95 grams Fluid:1.4-1.7 L/d  NUTRITION DIAGNOSIS: -Inadequate oral intake (NI-2.1).  Status: Ongoing  RELATED TO:  -altered GI function  AS EVIDENCE BY:  -N/V at admission, pt NPO/Clear Liquids >7d  MONITORING/EVALUATION(Goals): -Pt will meet >75% est energy and protein needs. -She will maintain UBW range.  -She will tol diet advancement to solid foods without N/V. -Monitor po's, labs and wt changes  EDUCATION NEEDS: -Education not appropriate at this time  INTERVENTION: -TPN (Clinimix 5/15) initiated; adv to goal rate of 70  ml/hr (provides 1193 kcal, 84 grams protein) -20% Fat emulsion @ 10 ml/hr-(provides 500 kcal/d)   Dietitian [phone]  DOCUMENTATION CODES Per approved criteria  -Not Applicable    Francene Boyers 08/02/2011, 10:56 AM

## 2011-08-02 NOTE — Progress Notes (Signed)
[name], [name]                 ACCOUNT NO.:  [id number]  MEDICAL RECORD NO.:  [id number]  LOCATION:  A316                          FACILITY:  [facility]  PHYSICIAN:  Kingsley Callander. [name], MD       DATE OF BIRTH:  08/29/1923  DATE OF PROCEDURE: DATE OF DISCHARGE:                                PROGRESS NOTE   [name] denies any vomiting yesterday or last night.  She complains of dry mouth this morning.  She states she has been able to take her medication.  PHYSICAL EXAMINATION:  VITAL SIGNS:  Her vital signs this morning are normal.  Temperature is 98 with a pulse of 71, respirations 16, and blood pressure of 120/66, oxygen saturation is 100% on supplemental oxygen by nasal cannula. LUNGS:  Clear. HEART:  Regular with no murmurs. ABDOMEN:  Soft, nondistended, and nontender with no palpable organomegaly. EXTREMITIES:  Reveal no edema.  She has a PICC line in her right arm. There is no swelling of the right arm. MOUTH:  Appear somewhat dry. NEUROLOGIC STATUS:  Stable.  She has some tremor in the right hand.  IMPRESSION/PLAN: 1. Recurrent nausea and vomiting with a large hiatal hernia.  She is     on IV Protonix.  Symptoms are improved.  Her white count is 5.6.     She has had a reduction in her hemoglobin to 10.5 and has a     slightly low platelet count of 145,000 compared to 171,000 on the     6th.  We will discuss her case with Dr. Karilyn Cota in regard to a trial     of eating. 2. Hypokalemia.  Serum potassium has improved to 3.9 with IV     supplementation. 3. Nutrition.  Her albumin is now 2.6.  TPN will be continued for now.     Her total protein level is 5.6, phosphorus 3.9, magnesium is 1.6,     and calcium is 9.4.  Her cholesterol is 131 with triglyceride level     of 114. 4. Parkinson's disease.  Continue Sinemet. 5. Abnormal urinalysis.  Urine culture from the 1st is negative.     Renal function is stable.  She has urinary incontinence, but denies     any dysuria.     Kingsley Callander. [name], MD     ROF/MEDQ  D:  08/02/2011  T:  08/02/2011  Job:  [id number]

## 2011-08-03 LAB — GLUCOSE, CAPILLARY
Glucose-Capillary: 127 mg/dL — ABNORMAL HIGH (ref 70–99)
Glucose-Capillary: 128 mg/dL — ABNORMAL HIGH (ref 70–99)
Glucose-Capillary: 129 mg/dL — ABNORMAL HIGH (ref 70–99)
Glucose-Capillary: 140 mg/dL — ABNORMAL HIGH (ref 70–99)
Glucose-Capillary: 153 mg/dL — ABNORMAL HIGH (ref 70–99)

## 2011-08-03 MED ORDER — CLINIMIX E/DEXTROSE (5/15) 5 % IV SOLN
INTRAVENOUS | Status: AC
  Administered 2011-08-03: 17:00:00 via INTRAVENOUS
  Filled 2011-08-03 (×2): qty 2000

## 2011-08-03 MED ORDER — FAT EMULSION 20 % IV EMUL
250.0000 mL | INTRAVENOUS | Status: AC
  Administered 2011-08-03: 250 mL via INTRAVENOUS
  Filled 2011-08-03: qty 250

## 2011-08-03 NOTE — Progress Notes (Signed)
NAMESHALEKA, [name]                 ACCOUNT NO.:  [id number]  MEDICAL RECORD NO.:  [id number]  LOCATION:  A316                          FACILITY:  [facility]  PHYSICIAN:  Kingsley Callander. [name], MD       DATE OF BIRTH:  1923/01/27  DATE OF PROCEDURE:  08/03/2011 DATE OF DISCHARGE:                                PROGRESS NOTE   [name] has had no vomiting yesterday or last night.  She was able to eat only a few bites of a liquid diet.  She had a very little appetite, however, she did not develop pain or nausea after eating the small amounts.  PHYSICAL EXAMINATION:  VITAL SIGNS:  Her vital signs this morning were normal with a temperature of 97.7, pulse 76, respirations 20, blood pressure 103/67, and an oxygen saturation 100% on 2 L. LUNGS:  Clear. HEART:  Regular with no murmurs. ABDOMEN:  Soft and nontender, nondistended with no hepatosplenomegaly.  IMPRESSION/PLAN: 1. Large hiatal hernia with recurrent nausea and vomiting.  Her case     has been discussed with Dr. Karilyn Cota.  The plan is to discuss the     case further with Dr. Leticia Penna regarding possible surgical repair.     Initial discussion now reveals that the patient is not really     interested in proceeding with a surgical option. 2. Malnutrition.  Continue TPN. 3. 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  She is oxygenating well and may be able to forego     supplemental oxygen at this point. 4. Parkinson disease.  Continue Sinemet.     Kingsley Callander. [name], MD     ROF/MEDQ  D:  08/03/2011  T:  08/03/2011  Job:  [id number]

## 2011-08-03 NOTE — Progress Notes (Signed)
Patient was full liquid diet yesterday but she is eating very little. She denies heartburn dysphagia or abdominal pain. Patient remains on TPN and tolerating it without any problems. Condition discussed with Dr. [name] Will proceed with surgical consultation with Dr. Dian Situ possible PEG placement for gastropexy and jejunal feeding tube placement.

## 2011-08-03 NOTE — Progress Notes (Signed)
PARENTERAL NUTRITION CONSULT NOTE - FOLLOW UP  Pharmacy Consult for TPN  Indication: malnutrion  Allergies  Allergen Reactions  . Metoclopramide Hcl    Patient Measurements: Height: 4\' 8"  (142.2 cm) Weight: 123 lb 10.9 oz (56.1 kg) IBW/kg (Calculated) : 36.3   Vital Signs: Temp: 97.7 F (36.5 C) (10/09 0554) BP: 103/67 mmHg (10/09 0554) Pulse Rate: 76  (10/09 0554) Intake/Output from previous day: 10/08 0701 - 10/09 0700 In: 4390.7 [P.O.:80; TPN:4310.7] Out: -  Intake/Output from this shift:    Labs:  [facility] 08/02/11 0501  WBC 5.6  HGB 10.5*  HCT 31.5*  PLT 145*  APTT --  INR --    Basename 08/02/11 0501 08/01/11 0431  NA 136 137  K 3.9 3.4*  CL 98 97  CO2 32 33*  GLUCOSE 123* 140*  BUN 20 16  CREATININE 0.58 0.56  LABCREA -- --  CREAT24HRUR -- --  CALCIUM 9.4 9.5  MG 1.6 --  PHOS 3.9 --  PROT 5.6* --  ALBUMIN 2.6* --  AST 9 --  ALT <5 --  ALKPHOS 54 --  BILITOT 0.3 --  BILIDIR -- --  IBILI -- --  PREALBUMIN 12.0* --  CHOLHDL -- --  CHOL 131 --   Estimated Creatinine Clearance: 35.2 ml/min (by C-G formula based on Cr of 0.58).    Basename 08/03/11 0721 08/03/11 0424 08/03/11  GLUCAP 129* 128* 134*   Medications:  Scheduled:     . amitriptyline  10 mg Oral QHS  . carbidopa-levodopa  2 tablet Oral TID  . insulin aspart  0-9 Units Subcutaneous Q4H  . mirtazapine  15 mg Oral QHS  . pantoprazole (PROTONIX) IV  40 mg Intravenous Q12H  . PARoxetine  10 mg Oral Daily  . polyethylene glycol  17 g Oral Daily  . sodium chloride  10 mL Intracatheter Q12H   Insulin Requirements in the past 24 hours:  Added sensitive sliding scale to cover increasing glucose  Current Nutrition:  NPO  Assessment: Malnutrion Sugars improved on SSI  Nutritional Goals:  1500 kCal, 90 grams of protein per day    Plan:  Added sensitive sliding scale insulin every 4 hours per protocol continue TPN at 70 ml per hour (goal rate) Fat infusion 10 ml per  hour F/u labs tomorrow  Valrie Hart A 08/03/2011,10:35 AM

## 2011-08-03 NOTE — Consult Note (Signed)
Reason for Consult:Large Symptomatic Hiatal hernia Referring Physician: [name]/Fagan  [name] is an 77 y.o. female.  HPI: Patient presented to [facility] ED with increasing nausea and vomiting.  Denies any bloody emesis or similar episodes in the past.  Work-up demonstrated a large hiatal hernia with the majority of her stomach in the mediastinum.  Currently she denies any pain other than with BM.  No fevers or chills.  No current nausea but patient admits to limited PO intake.  Patient has a limited appetite with early satiety.  No abdominal pain.  Past Medical History  Diagnosis Date  . Hypertension   . GERD (gastroesophageal reflux disease)   . Parkinson disease   . Depression   . Pulmonary embolism     Past Surgical History  Procedure Date  . Esophagogastroduodenoscopy 06/10/2011    Procedure: ESOPHAGOGASTRODUODENOSCOPY (EGD);  Surgeon: Malissa Hippo, MD;  Location: [facility] ENDO SUITE;  Service: Endoscopy;  Laterality: N/A;  . Vena cava filter placement     History reviewed. No pertinent family history.  Social History:  reports that she has never smoked. She does not have any smokeless tobacco history on file. She reports that she does not drink alcohol or use illicit drugs.  Allergies:  Allergies  Allergen Reactions  . Metoclopramide Hcl     Medications:  Prior to Admission:  Prescriptions prior to admission  Medication Sig Dispense Refill  . acetaminophen (TYLENOL) 650 MG CR tablet Take 650 mg by mouth every 8 (eight) hours as needed. For pain        . amitriptyline (ELAVIL) 10 MG tablet Take 10 mg by mouth at bedtime.        . carbidopa-levodopa (SINEMET CR) 25-100 MG per tablet Take 2 tablets by mouth 3 (three) times daily.        . carbidopa-levodopa (SINEMET) 25-100 MG per tablet Take 2 tablets by mouth 3 (three) times daily.        . Omeprazole 20 MG TBEC Take by mouth 1 day or 1 dose.        Marland Kitchen PARoxetine (PAXIL) 10 MG tablet Take 10 mg by mouth every morning.          . carbidopa-levodopa-entacapone (STALEVO) 25-100-200 MG per tablet Take 1 tablet by mouth 3 (three) times daily.       . mirtazapine (REMERON) 15 MG tablet Take 15 mg by mouth at bedtime.        . paroxetine mesylate (PEXEVA) 10 MG tablet Take 10 mg by mouth every morning.         Scheduled:   . amitriptyline  10 mg Oral QHS  . carbidopa-levodopa  2 tablet Oral TID  . insulin aspart  0-9 Units Subcutaneous Q4H  . mirtazapine  15 mg Oral QHS  . pantoprazole (PROTONIX) IV  40 mg Intravenous Q12H  . PARoxetine  10 mg Oral Daily  . polyethylene glycol  17 g Oral Daily  . sodium chloride  10 mL Intracatheter Q12H   Continuous:   . TPN (CLINIMIX) +/- additives     And  . fat emulsion    . TPN (CLINIMIX) +/- additives 70 mL/hr at 08/02/11 1828   And  . fat emulsion 250 mL (08/02/11 1828)  . TPN (CLINIMIX) +/- additives     And  . fat emulsion     BJY:NWGNFAOZHYQMV, acetaminophen, alum & mag hydroxide-simeth, ondansetron (ZOFRAN) IV, ondansetron, sodium chloride  Results for orders placed during the hospital encounter of 07/26/11 (from  the past 48 hour(s))  GLUCOSE, CAPILLARY     Status: Abnormal   Collection Time   08/01/11  4:57 PM      Component Value Range Comment   Glucose-Capillary 120 (*) 70 - 99 (mg/dL)   GLUCOSE, CAPILLARY     Status: Abnormal   Collection Time   08/01/11  9:08 PM      Component Value Range Comment   Glucose-Capillary 135 (*) 70 - 99 (mg/dL)    Comment 1 Notify RN     GLUCOSE, CAPILLARY     Status: Abnormal   Collection Time   08/02/11 12:02 AM      Component Value Range Comment   Glucose-Capillary 137 (*) 70 - 99 (mg/dL)    Comment 1 Notify RN     GLUCOSE, CAPILLARY     Status: Abnormal   Collection Time   08/02/11  4:06 AM      Component Value Range Comment   Glucose-Capillary 130 (*) 70 - 99 (mg/dL)    Comment 1 Notify RN     COMPREHENSIVE METABOLIC PANEL     Status: Abnormal   Collection Time   08/02/11  5:01 AM      Component Value Range  Comment   Sodium 136  135 - 145 (mEq/L)    Potassium 3.9  3.5 - 5.1 (mEq/L)    Chloride 98  96 - 112 (mEq/L)    CO2 32  19 - 32 (mEq/L)    Glucose, Bld 123 (*) 70 - 99 (mg/dL)    BUN 20  6 - 23 (mg/dL)    Creatinine, Ser 3.08  0.50 - 1.10 (mg/dL)    Calcium 9.4  8.4 - 10.5 (mg/dL)    Total Protein 5.6 (*) 6.0 - 8.3 (g/dL)    Albumin 2.6 (*) 3.5 - 5.2 (g/dL)    AST 9  0 - 37 (U/L)    ALT <5  0 - 35 (U/L)    Alkaline Phosphatase 54  39 - 117 (U/L)    Total Bilirubin 0.3  0.3 - 1.2 (mg/dL)    GFR calc non Af Amer 81 (*) >90 (mL/min)    GFR calc Af Amer >90  >90 (mL/min)   MAGNESIUM     Status: Normal   Collection Time   08/02/11  5:01 AM      Component Value Range Comment   Magnesium 1.6  1.5 - 2.5 (mg/dL)   PHOSPHORUS     Status: Normal   Collection Time   08/02/11  5:01 AM      Component Value Range Comment   Phosphorus 3.9  2.3 - 4.6 (mg/dL)   CBC     Status: Abnormal   Collection Time   08/02/11  5:01 AM      Component Value Range Comment   WBC 5.6  4.0 - 10.5 (K/uL)    RBC 3.32 (*) 3.87 - 5.11 (MIL/uL)    Hemoglobin 10.5 (*) 12.0 - 15.0 (g/dL)    HCT 65.7 (*) 84.6 - 46.0 (%)    MCV 94.9  78.0 - 100.0 (fL)    MCH 31.6  26.0 - 34.0 (pg)    MCHC 33.3  30.0 - 36.0 (g/dL)    RDW 96.2  95.2 - 84.1 (%)    Platelets 145 (*) 150 - 400 (K/uL)   DIFFERENTIAL     Status: Abnormal   Collection Time   08/02/11  5:01 AM      Component Value Range Comment  Neutrophils Relative 55  43 - 77 (%)    Neutro Abs 3.1  1.7 - 7.7 (K/uL)    Lymphocytes Relative 18  12 - 46 (%)    Lymphs Abs 1.0  0.7 - 4.0 (K/uL)    Monocytes Relative 15 (*) 3 - 12 (%)    Monocytes Absolute 0.9  0.1 - 1.0 (K/uL)    Eosinophils Relative 11 (*) 0 - 5 (%)    Eosinophils Absolute 0.6  0.0 - 0.7 (K/uL)    Basophils Relative 1  0 - 1 (%)    Basophils Absolute 0.0  0.0 - 0.1 (K/uL)   CHOLESTEROL, TOTAL     Status: Normal   Collection Time   08/02/11  5:01 AM      Component Value Range Comment   Cholesterol 131   0 - 200 (mg/dL)   TRIGLYCERIDES     Status: Normal   Collection Time   08/02/11  5:01 AM      Component Value Range Comment   Triglycerides 114  <150 (mg/dL)   PREALBUMIN     Status: Abnormal   Collection Time   08/02/11  5:01 AM      Component Value Range Comment   Prealbumin 12.0 (*) 17.0 - 34.0 (mg/dL)   GLUCOSE, CAPILLARY     Status: Abnormal   Collection Time   08/02/11  7:23 AM      Component Value Range Comment   Glucose-Capillary 133 (*) 70 - 99 (mg/dL)    Comment 1 Notify RN     GLUCOSE, CAPILLARY     Status: Abnormal   Collection Time   08/02/11 11:15 AM      Component Value Range Comment   Glucose-Capillary 133 (*) 70 - 99 (mg/dL)    Comment 1 Notify RN     GLUCOSE, CAPILLARY     Status: Abnormal   Collection Time   08/02/11  4:14 PM      Component Value Range Comment   Glucose-Capillary 134 (*) 70 - 99 (mg/dL)    Comment 1 Notify RN      Comment 2 Documented in Chart     GLUCOSE, CAPILLARY     Status: Abnormal   Collection Time   08/02/11  8:26 PM      Component Value Range Comment   Glucose-Capillary 138 (*) 70 - 99 (mg/dL)   GLUCOSE, CAPILLARY     Status: Abnormal   Collection Time   08/03/11 12:00 AM      Component Value Range Comment   Glucose-Capillary 134 (*) 70 - 99 (mg/dL)   GLUCOSE, CAPILLARY     Status: Abnormal   Collection Time   08/03/11  4:24 AM      Component Value Range Comment   Glucose-Capillary 128 (*) 70 - 99 (mg/dL)   GLUCOSE, CAPILLARY     Status: Abnormal   Collection Time   08/03/11  7:21 AM      Component Value Range Comment   Glucose-Capillary 129 (*) 70 - 99 (mg/dL)    Comment 1 Notify RN     GLUCOSE, CAPILLARY     Status: Abnormal   Collection Time   08/03/11 11:22 AM      Component Value Range Comment   Glucose-Capillary 127 (*) 70 - 99 (mg/dL)    Comment 1 Notify RN       No results found.  Review of Systems  Constitutional: Negative.   HENT: Negative.        Hard  of hearing  Respiratory: Negative.   Cardiovascular:  Negative.   Gastrointestinal: Positive for heartburn, nausea, vomiting, abdominal pain and diarrhea. Negative for constipation, blood in stool and melena.  Genitourinary: Negative.   Musculoskeletal: Negative.   Skin: Negative.   Neurological: Negative.   Endo/Heme/Allergies: Negative.   Psychiatric/Behavioral: Negative.    Blood pressure 109/70, pulse 89, temperature 98.6 F (37 C), temperature source Oral, resp. rate 18, height 4\' 8"  (1.422 m), weight 56.1 kg (123 lb 10.9 oz), SpO2 91.00%. Physical Exam  Constitutional: She is oriented to person, place, and time. She appears well-developed and well-nourished.       elderly  HENT:  Head: Normocephalic and atraumatic.  Eyes: Conjunctivae and EOM are normal. Pupils are equal, round, and reactive to light.  Neck: Normal range of motion. No tracheal deviation present. No thyromegaly present.  Cardiovascular: Normal rate, regular rhythm and normal heart sounds.   Respiratory: Effort normal and breath sounds normal. No respiratory distress. She has no wheezes.  GI: Soft. Bowel sounds are normal. She exhibits no distension and no mass. There is no tenderness. There is no rebound and no guarding.  Lymphadenopathy:    She has no cervical adenopathy.  Neurological: She is alert and oriented to person, place, and time.  Skin: Skin is warm and dry.    Assessment/Plan: Large hiatal hernia.  Long discussion with patient regarding surgical options including laparoscopic/open transabdominal hiatal hernia repair, referral for transthoracic approach, temporizing issues with a more distal feeding tube, continued current management with TPN, versus no additional intervention.  Patient stated adamantly that she does not want to undergo any surgical procedures.  I again discussed risks, benefits, and alternatives of management options.  Patient expressed and repeated understanding of options and again expressed her desire not to pursue surgical options.  At  this time, patient is competent, understands options, and does not have any emergent surgical findings.  We discussed broaching these issues again after she has had additional time to consider, and I will be available as needed.  I appreciate the opportunity to interact with this patient and can not fault her decision.  Jodine Muchmore C 08/03/2011, 4:51 PM

## 2011-08-04 LAB — BASIC METABOLIC PANEL
BUN: 31 mg/dL — ABNORMAL HIGH (ref 6–23)
CO2: 32 mEq/L (ref 19–32)
Chloride: 99 mEq/L (ref 96–112)
Creatinine, Ser: 0.67 mg/dL (ref 0.50–1.10)
Glucose, Bld: 130 mg/dL — ABNORMAL HIGH (ref 70–99)

## 2011-08-04 LAB — GLUCOSE, CAPILLARY
Glucose-Capillary: 142 mg/dL — ABNORMAL HIGH (ref 70–99)
Glucose-Capillary: 126 mg/dL — ABNORMAL HIGH (ref 70–99)
Glucose-Capillary: 123 mg/dL — ABNORMAL HIGH (ref 70–99)
Glucose-Capillary: 109 mg/dL — ABNORMAL HIGH (ref 70–99)

## 2011-08-04 MED ORDER — FAT EMULSION 20 % IV EMUL
250.0000 mL | INTRAVENOUS | Status: AC
  Administered 2011-08-04: 250 mL via INTRAVENOUS
  Filled 2011-08-04 (×2): qty 250

## 2011-08-04 MED ORDER — TRACE MINERALS CR-CU-MN-SE-ZN 10-1000-500-60 MCG/ML IV SOLN
INTRAVENOUS | Status: AC
  Administered 2011-08-04: 17:00:00 via INTRAVENOUS
  Filled 2011-08-04 (×2): qty 2000

## 2011-08-04 NOTE — Progress Notes (Signed)
UR Chart Review Completed  

## 2011-08-04 NOTE — Progress Notes (Signed)
6 Days Post-Op  Subjective: Patient's comfortable. She did tolerate some soup last night. She denies any current nausea or vomiting. No abdominal pain. No shortness of breath.  Objective: Vital signs in last 24 hours: Temp:  [98.3 F (36.8 C)-98.6 F (37 C)] 98.4 F (36.9 C) (10/10 0528) Pulse Rate:  [80-89] 81  (10/10 0528) Resp:  [18-20] 20  (10/10 0528) BP: (94-111)/(62-70) 111/70 mmHg (10/10 0528) SpO2:  [91 %-98 %] 96 % (10/10 0528) Last BM Date: 08/03/11  Intake/Output from previous day: 10/09 0701 - 10/10 0700 In: 2193.2 [P.O.:360; TPN:1833.2] Out: -  Intake/Output this shift: Total I/O In: 110 [P.O.:110] Out: -   General appearance: Awake, oriented to self, place, month. No acute distress. Pleasant GI: soft, non-tender; bowel sounds normal; no masses,  no organomegaly  Lab Results:  @LABLAST2 (wbc:2,hgb:2,hct:2,plt:2) BMET  Basename 08/04/11 0448 08/02/11 0501  NA 136 136  K 4.3 3.9  CL 99 98  CO2 32 32  GLUCOSE 130* 123*  BUN 31* 20  CREATININE 0.67 0.58  CALCIUM 9.6 9.4   PT/INR No results found for this basename: LABPROT:2,INR:2 in the last 72 hours ABG No results found for this basename: PHART:2,PCO2:2,PO2:2,HCO3:2 in the last 72 hours  Studies/Results: No results found.  Anti-infectives: Anti-infectives    None      Assessment/Plan: s/p Procedure(s): ESOPHAGOGASTRODUODENOSCOPY (EGD) Large hiatal hernia. Tolerating TPN. Still adamant against surgical intervention.  I did call the patient's daughter as requested. I had a long discussion regarding surgical options and nonsurgical options. As patient is competent I discussed her decision must be respected. Patient's family is requesting a meeting tomorrow at 4 PM. At this point I recommend continuing with TPN. Continue with oral intake as tolerated. There are no acute surgical indications the patient remained stable.  LOS: 9 days    Enya Bureau C 08/04/2011

## 2011-08-04 NOTE — Progress Notes (Signed)
NAMEKENTRELL, GUETTLER                 ACCOUNT NO.:  [id number]  MEDICAL RECORD NO.:  [id number]  LOCATION:  A316                          FACILITY:  [facility]  PHYSICIAN:  Kingsley Callander. [name], MD       DATE OF BIRTH:  [birth year]  DATE OF PROCEDURE: DATE OF DISCHARGE:                                PROGRESS NOTE   Ms. Artz is alert and comfortable this morning.  She was able to eat some tomato soup last night.  Her case has been discussed with Dr. Leticia Penna and Dr. Karilyn Cota.  VITAL SIGNS:  Normal this morning with a temperature of 98.4, pulse 81 respirations 20, and blood pressure 111/70, oxygen saturations 96%. LUNGS:  Clear. HEART:  Regular with no murmurs.  ABDOMEN:  Soft, nondistended, and nontender with no hepatosplenomegaly.  Her PICC line remains in place in the right arm.  IMPRESSION/PLAN: 1. Recurrent vomiting due to large hiatal hernia.  We will have Dr.     Leticia Penna discuss her surgical options with her daughter, [name] this     morning.  Ms. Martell is somewhat reluctant to proceed with any     interventions. 2. Malnutrition.  Continue TPN.  Her electrolytes today are normal.     His potassium is 4.3. 3. Parkinson disease.  Continue Sinemet.     Kingsley Callander. [name], MD     ROF/MEDQ  D:  08/04/2011  T:  08/04/2011  Job:  [id number]

## 2011-08-04 NOTE — Progress Notes (Signed)
PARENTERAL NUTRITION CONSULT NOTE - FOLLOW UP  Pharmacy Consult for TPN  Indication: malnutrion  Allergies  Allergen Reactions  . Metoclopramide Hcl    Patient Measurements: Height: 4\' 8"  (142.2 cm) Weight: 123 lb 10.9 oz (56.1 kg) IBW/kg (Calculated) : 36.3   Vital Signs: Temp: 98.4 F (36.9 C) (10/10 0528) BP: 111/70 mmHg (10/10 0528) Pulse Rate: 81  (10/10 0528) Intake/Output from previous day: 10/09 0701 - 10/10 0700 In: 2193.2 [P.O.:360; TPN:1833.2] Out: -  Intake/Output from this shift:    Labs:  [facility] 08/02/11 0501  WBC 5.6  HGB 10.5*  HCT 31.5*  PLT 145*  APTT --  INR --    Basename 08/04/11 0448 08/02/11 0501  NA 136 136  K 4.3 3.9  CL 99 98  CO2 32 32  GLUCOSE 130* 123*  BUN 31* 20  CREATININE 0.67 0.58  LABCREA -- --  CREAT24HRUR -- --  CALCIUM 9.6 9.4  MG -- 1.6  PHOS -- 3.9  PROT -- 5.6*  ALBUMIN -- 2.6*  AST -- 9  ALT -- <5  ALKPHOS -- 54  BILITOT -- 0.3  BILIDIR -- --  IBILI -- --  PREALBUMIN -- 12.0*  CHOLHDL -- --  CHOL -- 131   Estimated Creatinine Clearance: 35.2 ml/min (by C-G formula based on Cr of 0.67).    Basename 08/04/11 0403 08/03/11 2356 08/03/11 1952  GLUCAP 142* 140* 127*   Medications:  Scheduled:     . amitriptyline  10 mg Oral QHS  . carbidopa-levodopa  2 tablet Oral TID  . insulin aspart  0-9 Units Subcutaneous Q4H  . mirtazapine  15 mg Oral QHS  . pantoprazole (PROTONIX) IV  40 mg Intravenous Q12H  . PARoxetine  10 mg Oral Daily  . polyethylene glycol  17 g Oral Daily  . sodium chloride  10 mL Intracatheter Q12H   Insulin Requirements in the past 24 hours:  Added sensitive sliding scale to cover increasing glucose  Current Nutrition:  NPO  Assessment: Eating very little Malnutrion Sugars improved on SSI  Nutritional Goals:  1500 kCal, 90 grams of protein per day    Plan:  Added sensitive sliding scale insulin every 4 hours per protocol continue TPN at 70 ml per hour (goal rate) Fat  infusion 10 ml per hour Add regular insulin 15 units/liter today F/u labs tomorrow  Valrie Hart A 08/04/2011,7:54 AM

## 2011-08-05 ENCOUNTER — Encounter (HOSPITAL_COMMUNITY): Payer: Self-pay | Admitting: Internal Medicine

## 2011-08-05 LAB — COMPREHENSIVE METABOLIC PANEL
ALT: <5 U/L (ref 0–35)
BUN: 30 mg/dL — ABNORMAL HIGH (ref 6–23)
CO2: 33 mEq/L — ABNORMAL HIGH (ref 19–32)
Calcium: 9.7 mg/dL (ref 8.4–10.5)
GFR calc Af Amer: >90 mL/min (ref >90)
GFR calc non Af Amer: 80 mL/min — ABNORMAL LOW (ref >90)
Glucose, Bld: 99 mg/dL (ref 70–99)
Sodium: 138 mEq/L (ref 135–145)

## 2011-08-05 LAB — GLUCOSE, CAPILLARY
Glucose-Capillary: 120 mg/dL — ABNORMAL HIGH (ref 70–99)
Glucose-Capillary: 121 mg/dL — ABNORMAL HIGH (ref 70–99)

## 2011-08-05 MED ORDER — FAT EMULSION 20 % IV EMUL
250.0000 mL | INTRAVENOUS | Status: AC
  Administered 2011-08-05: 250 mL via INTRAVENOUS
  Filled 2011-08-05 (×2): qty 250

## 2011-08-05 MED ORDER — INSULIN REGULAR HUMAN 100 UNIT/ML IJ SOLN
INTRAVENOUS | Status: AC
  Administered 2011-08-05: 17:00:00 via INTRAVENOUS
  Filled 2011-08-05 (×2): qty 2000

## 2011-08-05 NOTE — Progress Notes (Signed)
NAMECHLOE, [name]                 ACCOUNT NO.:  [id number]  MEDICAL RECORD NO.:  [id number]  LOCATION:  A316                          FACILITY:  [facility]  PHYSICIAN:  Kingsley Callander. [name], MD       DATE OF BIRTH:  [birth year]  DATE OF PROCEDURE: DATE OF DISCHARGE:                                PROGRESS NOTE   [name] was comfortable this morning.  She had a good bowel movement last night.  She is not vomiting.  She is tolerating her TPN without difficulty.  PHYSICAL EXAMINATION:  VITAL SIGNS:  Normal this morning.  Her temperature is 97.4 with a pulse of 86, respirations 18, and oxygen saturation 94%, which appears to be on room air since she is not wearing her oxygen cannula now, her blood pressure is 124/83. LUNGS:  Clear. HEART:  Regular with no murmurs. ABDOMEN:  Soft, nondistended, and nontender with no hepatosplenomegaly. NEURO:  Stable. EXTREMITIES:  Right upper extremity is stable with a PICC line in place.  IMPRESSION/PLAN: 1. Recurrent nausea and vomiting due to a large hiatal hernia.  The     family will have further discussion today with Dr. Leticia Penna     regarding possible intervention.  Continue TPN for now.  Her     electrolytes this morning reveal a potassium of 4.1, sodium 138,     calcium of 9.7.  Her albumin is 2.7. 2. Parkinson's disease.  Continue Sinemet. 3. History of pulmonary embolus status post vena cava filter     placement.     Kingsley Callander. [name], MD     ROF/MEDQ  D:  08/05/2011  T:  08/05/2011  Job:  [id number]

## 2011-08-05 NOTE — Progress Notes (Signed)
Patient has no complaints. She has had multiple loose stools today. She is taking small amounts of liquids by mouth and having no difficulty. She denies abdominal pain nausea vomiting cough or shortness of breath. Condition discussed with Dr. [name] and Dr. Leticia Penna. He met with Dr. Leticia Penna and has agreed to surgery on Monday. Fortunately they are are no other good alternatives or options as she wants to spend  rest of her life on liquids and TPN. Surgery can be accomplished laparoscopically. Will her [name] to consider physical therapyto slow down her muscle wasting.

## 2011-08-05 NOTE — Progress Notes (Signed)
PARENTERAL NUTRITION CONSULT NOTE - FOLLOW UP  Pharmacy Consult for TPN  Indication: malnutrion  Allergies  Allergen Reactions  . Metoclopramide Hcl    Patient Measurements: Height: 4\' 8"  (142.2 cm) Weight: 130 lb 15.3 oz (59.4 kg) IBW/kg (Calculated) : 36.3   Vital Signs: Temp: 97.4 F (36.3 C) (10/11 0753) Temp src: Oral (10/10 2049) BP: 124/83 mmHg (10/11 0753) Pulse Rate: 86  (10/11 0753) Intake/Output from previous day: 10/10 0701 - 10/11 0700 In: 2882 [P.O.:850; I.V.:10; TPN:2022] Out: 1 [Urine:1] Intake/Output from this shift:    Labs:   [facility] 08/05/11 0434 08/04/11 0448  NA 138 136  K 4.1 4.3  CL 100 99  CO2 33* 32  GLUCOSE 99 130*  BUN 30* 31*  CREATININE 0.61 0.67  LABCREA -- --  CREAT24HRUR -- --  CALCIUM 9.7 9.6  MG 1.7 --  PHOS 3.7 --  PROT 5.9* --  ALBUMIN 2.7* --  AST 10 --  ALT <5 --  ALKPHOS 68 --  BILITOT 0.2* --  BILIDIR -- --  IBILI -- --  PREALBUMIN -- --  CHOLHDL -- --  CHOL -- --   Estimated Creatinine Clearance: 36.3 ml/min (by C-G formula based on Cr of 0.61).    Basename 08/05/11 0737 08/05/11 0437 08/04/11 2051  GLUCAP 120* 110* 109*   Medications:  Scheduled:     . amitriptyline  10 mg Oral QHS  . carbidopa-levodopa  2 tablet Oral TID  . insulin aspart  0-9 Units Subcutaneous Q4H  . mirtazapine  15 mg Oral QHS  . pantoprazole (PROTONIX) IV  40 mg Intravenous Q12H  . PARoxetine  10 mg Oral Daily  . polyethylene glycol  17 g Oral Daily  . sodium chloride  10 mL Intracatheter Q12H   Insulin Requirements in the past 24 hours:  Added sensitive sliding scale to cover increasing glucose  Current Nutrition:  NPO  Assessment:  Tolerating TPN well at full rate.  CBG's under control. Some po intake taking place.  Nutritional Goals:  1500 kCal, 90 grams of protein per day    Plan:  Continue sensitive sliding scale insulin every 4 hours per protocol continue TPN at 70 ml per hour (goal rate) Fat infusion 10  ml per hour Continue regular insulin 15 units/liter today F/u labs tomorrow  Gilman Buttner, [place] J 08/05/2011,7:58 AM

## 2011-08-06 LAB — GLUCOSE, CAPILLARY
Glucose-Capillary: 131 mg/dL — ABNORMAL HIGH (ref 70–99)
Glucose-Capillary: 137 mg/dL — ABNORMAL HIGH (ref 70–99)

## 2011-08-06 MED ORDER — FAT EMULSION 20 % IV EMUL
250.0000 mL | INTRAVENOUS | Status: AC
  Administered 2011-08-06: 250 mL via INTRAVENOUS
  Filled 2011-08-06 (×2): qty 250

## 2011-08-06 MED ORDER — TRACE MINERALS CR-CU-MN-SE-ZN 10-1000-500-60 MCG/ML IV SOLN
INTRAVENOUS | Status: AC
  Administered 2011-08-06: 17:00:00 via INTRAVENOUS
  Filled 2011-08-06 (×2): qty 2000

## 2011-08-06 NOTE — Progress Notes (Signed)
PARENTERAL NUTRITION CONSULT NOTE - FOLLOW UP  Pharmacy Consult for TPN  Indication: malnutrion  Allergies  Allergen Reactions  . Metoclopramide Hcl    Patient Measurements: Height: 4\' 8"  (142.2 cm) Weight: 128 lb 12 oz (58.4 kg) IBW/kg (Calculated) : 36.3   Vital Signs: Temp: 97.6 F (36.4 C) (10/12 0525) Temp src: Oral (10/12 0525) BP: 131/73 mmHg (10/12 0525) Pulse Rate: 79  (10/12 0525) Intake/Output from previous day: 10/11 0701 - 10/12 0700 In: 2092 [P.O.:440; I.V.:10; TPN:1642] Out: 202 [Urine:200; Stool:2] Intake/Output from this shift:    Labs:   [facility] 08/05/11 0434 08/04/11 0448  NA 138 136  K 4.1 4.3  CL 100 99  CO2 33* 32  GLUCOSE 99 130*  BUN 30* 31*  CREATININE 0.61 0.67  LABCREA -- --  CREAT24HRUR -- --  CALCIUM 9.7 9.6  MG 1.7 --  PHOS 3.7 --  PROT 5.9* --  ALBUMIN 2.7* --  AST 10 --  ALT <5 --  ALKPHOS 68 --  BILITOT 0.2* --  BILIDIR -- --  IBILI -- --  PREALBUMIN -- --  CHOLHDL -- --  CHOL -- --   Estimated Creatinine Clearance: 35.9 ml/min (by C-G formula based on Cr of 0.61).    Basename 08/06/11 0813 08/06/11 0358 08/06/11 0038  GLUCAP 158* 131* 137*   Medications:  Scheduled:     . amitriptyline  10 mg Oral QHS  . carbidopa-levodopa  2 tablet Oral TID  . insulin aspart  0-9 Units Subcutaneous Q4H  . mirtazapine  15 mg Oral QHS  . pantoprazole (PROTONIX) IV  40 mg Intravenous Q12H  . PARoxetine  10 mg Oral Daily  . sodium chloride  10 mL Intracatheter Q12H  . DISCONTD: polyethylene glycol  17 g Oral Daily   Insulin Requirements in the past 24 hours:  Added sensitive sliding scale to cover increasing glucose  Current Nutrition:  NPO  Assessment: Tolerating TPN well at full rate.  CBG's under control but still getting some SSI Some po intake taking place.  Nutritional Goals:  1500 kCal, 90 grams of protein per day    Plan:  Continue sensitive sliding scale insulin continue TPN at 70 ml per hour (goal  rate) Fat infusion 10 ml per hour Increase regular insulin to 20 units/liter   Kirstie Larsen A 08/06/2011,10:20 AM

## 2011-08-06 NOTE — Progress Notes (Signed)
[name], [name]                 ACCOUNT NO.:  [id number]  MEDICAL RECORD NO.:  [id number]  LOCATION:  A316                          FACILITY:  [facility]  PHYSICIAN:  Kingsley Callander. [name], MD       DATE OF BIRTH:  Jun 27, 1923  DATE OF PROCEDURE:  08/06/2011 DATE OF DISCHARGE:                                PROGRESS NOTE   [name] is comfortable this morning.  She has had no vomiting.  She had several loose stools yesterday.  MiraLAX was discontinued.  She has had no vomiting and she denies abdominal pain.  She is tolerating TPN well.  PHYSICAL EXAMINATION: VITAL SIGNS:  Normal with a temperature of 97.6, pulse 79, respirations 16, blood pressure 131/73, oxygen saturation 94% on room air. LUNGS:  Clear. HEART:  Regular with no murmurs. ABDOMEN:  Soft, nondistended, nontender with no hepatosplenomegaly. EXTREMITIES:  Her right arm PICC line shows no problems.  IMPRESSION/PLAN: 1. Large hiatal hernia with recurrent nausea and vomiting.  She has     had further discussion with both Dr. Leticia Penna and Dr. Karilyn Cota.  The     plan is to proceed with surgery on Monday.  Continue TPN for now.     Her glucoses have ranged from 110-137.  As noted, her albumin     yesterday was 2.7.  Physical therapy will be consulted. 2. Parkinson disease.  Continue Sinemet.     Kingsley Callander. [name], MD     ROF/MEDQ  D:  08/06/2011  T:  08/06/2011  Job:  [id number]

## 2011-08-06 NOTE — Plan of Care (Signed)
Problem: Phase II Progression Outcomes Goal: Progress activity as tolerated unless otherwise ordered Outcome: Completed/Met Date Met:  08/06/11 See PT evaluation and progress notes for details.    Goal: Discharge plan established Outcome: Progressing PT recommending HHPT with 24 hour assist, but it will depend on how patient progresses after surgery? Scheduled for 08/09/11

## 2011-08-06 NOTE — Progress Notes (Signed)
  Surgical rounding note.    I met with the patient and her family as requested to discuss management options. Patient's daughter in niece were present during the discussion. All possible options were broached including: Continued TPN administration, distal feeding tube, and surgical repair of the hiatal hernia. Risks benefits and alternatives of each were discussed at length. Her questions were addressed as were the concerns and questions of the family. I encouraged the patient and family to discuss their options. Also encouraged and discussed the right for additional opinions. I will continue to be available should they opt to proceed with surgical intervention. 40 minutes were spent in discussion.

## 2011-08-06 NOTE — Progress Notes (Signed)
Physical Therapy Evaluation Patient Details Name: [name] MRN: [id number] DOB: [birth year] Today's Date: 08/06/2011  Time: 08:49- 09:18 Charge: 1 EV  Problem List:  Patient Active Problem List  Diagnoses  . HYPOTENSION  . GERD  . DYSPHAGIA  . Pulmonary embolism    Past Medical History:  Past Medical History  Diagnosis Date  . Hypertension   . GERD (gastroesophageal reflux disease)   . Parkinson disease   . Depression   . Pulmonary embolism    Past Surgical History:  Past Surgical History  Procedure Date  . Esophagogastroduodenoscopy 06/10/2011    Procedure: ESOPHAGOGASTRODUODENOSCOPY (EGD);  Surgeon: Malissa Hippo, MD;  Location: [facility];  Service: Endoscopy;  Laterality: N/A;  . Vena cava filter placement   . Esophagogastroduodenoscopy 07/29/2011    Procedure: ESOPHAGOGASTRODUODENOSCOPY (EGD);  Surgeon: Malissa Hippo, MD;  Location: [facility];  Service: Endoscopy;  Laterality: N/A;    PT Assessment/Plan/Recommendation PT Assessment Clinical Impression Statement: 77 y.o. female admitted to [facility] with N/V who presents with decreased strength, mobility, balance, activity tolerance, safety who would benefit from skilled PT to maximize functional mobility, indpenendence, and safety before returning home with daughter's assist PT Recommendation/Assessment: Patient will need skilled PT in the acute care venue PT Problem List: Decreased strength;Decreased range of motion;Decreased activity tolerance;Decreased balance;Decreased mobility;Decreased knowledge of use of DME PT Therapy Diagnosis : Difficulty walking;Abnormality of gait;Generalized weakness PT Plan PT Frequency: Min 3X/week PT Treatment/Interventions: DME instruction;Gait training;Functional mobility training;Therapeutic exercise;Balance training;Neuromuscular re-education;Patient/family education PT Recommendation Follow Up Recommendations: Home health PT;24 hour supervision/assistance;Other (comment)  (depends on outcome after surgery scheduled for Monday 10-15) Equipment Recommended: None recommended by PT PT Goals  Acute Rehab PT Goals PT Goal Formulation: With patient Time For Goal Achievement: 2 weeks Pt will go Supine/Side to Sit: with min assist PT Goal: Supine/Side to Sit - Progress: Progressing toward goal Pt will go Sit to Supine/Side: with min assist PT Goal: Sit to Supine/Side - Progress: Other (comment) Pt will Transfer Sit to Stand/Stand to Sit:  (not tested today) Pt will Transfer Bed to Chair/Chair to Bed: with min assist PT Transfer Goal: Bed to Chair/Chair to Bed - Progress: Progressing toward goal Pt will Ambulate: 1 - 15 feet;with min assist;with rolling walker PT Goal: Ambulate - Progress: Other (comment) (not tested today)  PT Evaluation Precautions/Restrictions    Prior Functioning  Home Living Type of Home: House Lives With: Alone;Other (Comment) Receives Help From: Family;Personal care attendant (aid 5 days/wk 8 hours/day, daughter stays nights and weekend) Home Layout: One level Home Access: Ramped entrance Bathroom Shower/Tub: Walk-in shower;Curtain Bathroom Toilet: Standard Home Adaptive Equipment: Bedside commode/3-in-1;Grab bars around toilet;Grab bars in shower;Shower chair with back;Walker - rolling Prior Function Level of Independence: Needs assistance with ADLs;Needs assistance with homemaking;Needs assistance with gait;Needs assistance with tranfers Driving: No Cognition Cognition Orientation Level: Oriented X4 Cognition - Other Comments: HOH Sensation/Coordination   Extremity Assessment RLE Assessment RLE Assessment: Exceptions to [facility] RLE AROM (degrees) Right Knee Extension 0-130: -20  (limited by hamstring flexibility) Right Ankle Dorsiflexion 0-20: -5  (inverted secondary to h/o bil ankle surgery.  ) RLE Strength RLE Overall Strength: Deficits RLE Overall Strength Comments: 3-/5 LLE Assessment LLE Assessment: Exceptions to  WFL LLE AROM (degrees) Left Knee Extension 0-130: -20  (limited by poor hamstring flexibility.  ) Left Ankle Dorsiflexion 0-20: -5  (inverted secondary to h/o bil ankle surgery.  ) LLE Strength LLE Overall Strength: Deficits LLE Overall Strength Comments: 3-/5 Mobility (including Balance)  Bed Mobility Bed Mobility: Yes Supine to Sit: 3: Mod assist;HOB elevated (Comment degrees) (HOB 40 degrees.  ) Supine to Sit Details (indicate cue type and reason): mod assist of trunk to get to upright sitting. Patient pulling with bilateral arms.   Sitting - Scoot to Edge of Bed: 3: Mod assist Sitting - Scoot to Edge of Bed Details (indicate cue type and reason): mod assist to weight shift hips on compliant bed.  Used bed pad.   Transfers Transfers: Yes Sit to Stand: 3: Mod assist;From elevated surface;With upper extremity assist;From bed Sit to Stand Details (indicate cue type and reason): mod assist to support trunk over weak legs.   Stand to Sit: 3: Mod assist;With upper extremity assist;To chair/3-in-1 Stand to Sit Details: mod assist to help control descent to sit.   Stand Pivot Transfers: From elevated surface Stand Pivot Transfer Details (indicate cue type and reason): mod assist to support trunk for balance with verbal cues for side steps and upright posture.  Used RW for transfer.   Ambulation/Gait Ambulation/Gait: No (not safe with only one person assisting.  )    Exercise  Total Joint Exercises Long Arc Quad: AROM;Both;10 reps;Seated General Exercises - Upper Extremity Shoulder Flexion: AROM;Both;10 reps;Seated Elbow Flexion: AROM;Both;10 reps;Seated General Exercises - Lower Extremity Long Arc Quad: AROM;Both;10 reps;Seated Hip Flexion/Marching: AROM;Both;10 reps;Seated Shoulder Exercises Shoulder Flexion: AROM;Both;10 reps;Seated Elbow Flexion: AROM;Both;10 reps;Seated Low Level/ICU Exercises Shoulder Flexion: AROM;Both;10 reps;Seated Elbow Flexion: AROM;Both;10  reps;Seated Amputee Exercises Hip Flexion/Marching: AROM;Both;10 reps;Seated End of Session PT - End of Session Equipment Utilized During Treatment: Gait belt (RW) Activity Tolerance: Patient limited by fatigue Patient left: in chair;with call bell in reach General Behavior During Session: [facility] for tasks performed Cognition: [facility] for tasks performed  Donisha Hoch, Claretta Fraise 08/06/2011, 9:43 AM

## 2011-08-06 NOTE — Progress Notes (Signed)
8 Days Post-Op  Subjective: Patient comfortable, no nausea.  Pt states the family has discussed options and she wishes to proceed.  When asked, somewhat confused regarding exactly what the procedure entails.  No SOB, NO chest pain.  Objective: Vital signs in last 24 hours: Temp:  [97.4 F (36.3 [name])-98.4 F (36.9 [name])] 97.6 F (36.4 [name]) (10/12 0525) Pulse Rate:  [79-87] 79  (10/12 0525) Resp:  [16-18] 16  (10/12 0525) BP: (108-131)/(70-80) 131/73 mmHg (10/12 0525) SpO2:  [94 %-96 %] 94 % (10/12 0525) Weight:  [58.4 kg (128 lb 12 oz)] 128 lb 12 oz (58.4 kg) (10/12 0511) Last BM Date: 08/05/11  Intake/Output from previous day: 10/11 0701 - 10/12 0700 In: 2092 [P.O.:440; I.V.:10; TPN:1642] Out: 202 [Urine:200; Stool:2] Intake/Output this shift:    General appearance: alert and no distress Resp: clear to auscultation bilaterally GI: soft, non-tender; bowel sounds normal; no masses,  no organomegaly  Lab Results:  @LABLAST2 (wbc:2,hgb:2,hct:2,plt:2) BMET  Basename 08/05/11 0434 08/04/11 0448  NA 138 136  K 4.1 4.3  CL 100 99  CO2 33* 32  GLUCOSE 99 130*  BUN 30* 31*  CREATININE 0.61 0.67  CALCIUM 9.7 9.6   PT/INR No results found for this basename: LABPROT:2,INR:2 in the last 72 hours ABG No results found for this basename: PHART:2,PCO2:2,PO2:2,HCO3:2 in the last 72 hours  Studies/Results: No results found.  Anti-infectives: Anti-infectives    None      Assessment/Plan: s/p Procedure(s): ESOPHAGOGASTRODUODENOSCOPY (EGD) Large Hiatal hernia.  Again I had a long discussion with the patient at the bedside regarding options.  She is able to reexplain the procedures as well as the risks and benefits. She states that she would prefer not to be on long-term parenteral nutrition. While she does state she would prefer not to proceed with surgery she feels that repair of a hiatal hernia is her best option. We again discussed cessation of her DO NOT RESUSCITATE status during the  perioperative period. At this point I discussed with the patient that we will schedule her for a laparoscopic possible open hiatal hernia repair for Monday. I encouraged her to continue to consider and discuss her options with her family. Patient states that she is agreeable with this plan. Additionally I contacted the daughter by phone to further discuss the procedure. She confirmed that they had discussed and agreed with plan hiatal hernia repair. I again discussed risks and anticipated recovery given the patient's age. She states understanding and also is agreeable to tentatively scheduling the procedure. I have also updated Dr. [name] with current plans.  As it currently stands and as discussed with the patient, family, and Dr. [name]; the patient will be scheduled for a laparoscopic possible open hiatal hernia repair with planned placement of a surgically placed gastrostomy tube to assist with pexing of the stomach to the anterior abdominal wall. Additionally patient's DO NOT RESUSCITATE status will be suspended during the perioperative period and will ideally be resumed upon return to a regular hospital floor. There is a possibility due to the patient's age as well as the procedure itself of requiring a more prolonged period on the ventilator. This would obviously delay any resumption of the patient's DO NOT RESUSCITATE status. Risk of proceeding with the laparoscopic possible open hiatal hernia repair include but are not limited to the risk of bleeding, infection, gastric or esophageal injury and perforation, pneumothorax, bowel injury, perioperative cardiac and pulmonary events. Furthermore it has been discussed with the family that Dr. Lovell Sheehan is  my partner and that at some point there will be likely interactions with him. As it has not been clearly disclosed regarding their potential issues with him, I have discussed with them that they need to be comfortable with potential interactions. At this time we  will continue the current course and patient will be preop'd on Sunday.  LOS: 11 days    [name] 08/06/2011

## 2011-08-07 LAB — BASIC METABOLIC PANEL
CO2: 33 mEq/L — ABNORMAL HIGH (ref 19–32)
Glucose, Bld: 106 mg/dL — ABNORMAL HIGH (ref 70–99)
Potassium: 4.8 mEq/L (ref 3.5–5.1)
Sodium: 137 mEq/L (ref 135–145)

## 2011-08-07 LAB — GLUCOSE, CAPILLARY
Glucose-Capillary: 106 mg/dL — ABNORMAL HIGH (ref 70–99)
Glucose-Capillary: 107 mg/dL — ABNORMAL HIGH (ref 70–99)

## 2011-08-07 LAB — CLOSTRIDIUM DIFFICILE BY PCR: Toxigenic C. Difficile by PCR: NEGATIVE

## 2011-08-07 MED ORDER — FAT EMULSION 20 % IV EMUL
250.0000 mL | INTRAVENOUS | Status: AC
  Administered 2011-08-07: 250 mL via INTRAVENOUS
  Filled 2011-08-07: qty 250

## 2011-08-07 MED ORDER — LOPERAMIDE HCL 2 MG PO CAPS
2.0000 mg | ORAL_CAPSULE | Freq: Once | ORAL | Status: AC
  Administered 2011-08-07: 2 mg via ORAL
  Filled 2011-08-07: qty 1

## 2011-08-07 MED ORDER — PANTOPRAZOLE SODIUM 40 MG PO TBEC
40.0000 mg | DELAYED_RELEASE_TABLET | Freq: Two times a day (BID) | ORAL | Status: DC
  Administered 2011-08-07 – 2011-08-08 (×4): 40 mg via ORAL
  Filled 2011-08-07 (×4): qty 1

## 2011-08-07 MED ORDER — INSULIN REGULAR HUMAN 100 UNIT/ML IJ SOLN
INTRAVENOUS | Status: AC
  Administered 2011-08-07: 18:00:00 via INTRAVENOUS
  Filled 2011-08-07: qty 2000

## 2011-08-07 NOTE — Progress Notes (Signed)
NAMEJOEANNE, [name]                 ACCOUNT NO.:  [id number]  MEDICAL RECORD NO.:  [id number]  LOCATION:  A316                          FACILITY:  [facility]  PHYSICIAN:  Kingsley Callander. [name], MD       DATE OF BIRTH:  1923-01-03  DATE OF PROCEDURE: DATE OF DISCHARGE:                                PROGRESS NOTE   SUBJECTIVE:  Ms. [name] is comfortable this morning.  She has been able to eat some oatmeal and drink some orange juice.  She denies any vomiting or any abdominal pain.  OBJECTIVE:  VITAL SIGNS:  Normal.  She is oxygenating well with an O2 sat of 94% on room air. LUNGS:  Clear. HEART:  Regular with no murmurs. ABDOMEN:  Soft and nontender with no hepatosplenomegaly. EXTREMITIES:  Her PICC line in right arm looks fine.  IMPRESSION/PLAN: 1. Large hiatal hernia with recurrent nausea and vomiting.  Further     discussion has been held with the family and with Dr. Leticia Penna.  The     plan is to proceed with hiatal hernia repair on Monday. 2. Malnutrition.  She is tolerating TPN well.  Glucoses have been very     stable and well controlled and it does not appear that she needs     such frequent Accu-Cheks and sliding scale insulin at this point.     These will be discontinued. 3. Parkinson disease.  Continue Sinemet.     Kingsley Callander. [name], MD     ROF/MEDQ  D:  08/07/2011  T:  08/07/2011  Job:  [id number]

## 2011-08-07 NOTE — Progress Notes (Signed)
PARENTERAL NUTRITION CONSULT NOTE - FOLLOW UP  Pharmacy Consult for TPN  Indication: malnutrition, minimal po intake  Allergies  Allergen Reactions  . Metoclopramide Hcl    Patient Measurements: Height: 4\' 8"  (142.2 cm) Weight: 128 lb 12 oz (58.4 kg) IBW/kg (Calculated) : 36.3   Vital Signs: Temp: 98.1 F (36.7 C) (10/12 2157) Temp src: Oral (10/12 2157) BP: 119/72 mmHg (10/12 2157) Pulse Rate: 79  (10/12 2157) Intake/Output from previous day: 10/12 0701 - 10/13 0700 In: 2124 [P.O.:120; I.V.:10; IV Piggyback:10; ZOX:0960] Out: -  Intake/Output from this shift:    Labs:   [facility] 08/07/11 0722 08/05/11 0434  NA 137 138  K 4.8 4.1  CL 100 100  CO2 33* 33*  GLUCOSE 106* 99  BUN 28* 30*  CREATININE 0.64 0.61  LABCREA -- --  CREAT24HRUR -- --  CALCIUM 9.8 9.7  MG -- 1.7  PHOS -- 3.7  PROT -- 5.9*  ALBUMIN -- 2.7*  AST -- 10  ALT -- <5  ALKPHOS -- 68  BILITOT -- 0.2*  BILIDIR -- --  IBILI -- --  PREALBUMIN -- --  CHOLHDL -- --  CHOL -- --   Estimated Creatinine Clearance: 35.9 ml/min (by C-G formula based on Cr of 0.64).    Basename 08/07/11 0714 08/07/11 0420 08/06/11 2359  GLUCAP 107* 106* 119*   Medications:  Scheduled:     . amitriptyline  10 mg Oral QHS  . carbidopa-levodopa  2 tablet Oral TID  . mirtazapine  15 mg Oral QHS  . pantoprazole (PROTONIX) IV  40 mg Intravenous Q12H  . PARoxetine  10 mg Oral Daily  . sodium chloride  10 mL Intracatheter Q12H  . DISCONTD: insulin aspart  0-9 Units Subcutaneous Q4H   Insulin Requirements in the past 24 hours:  None in addition to insulin in TPN  Current Nutrition:  TPN, minimal oral intake  Assessment: Tolerating TPN well at full rate.  CBG's under control Some po intake taking place.  Nutritional Goals:  1500 kCal, 90 grams of protein per day    Plan:  Stop SSI continue TPN at 70 ml per hour (goal rate) w/ insulin in TPN Fat infusion 10 ml per hour  Margo Aye, Jayln Branscom A 08/07/2011,9:38  AM

## 2011-08-08 MED ORDER — ENOXAPARIN SODIUM 40 MG/0.4ML ~~LOC~~ SOLN
40.0000 mg | Freq: Once | SUBCUTANEOUS | Status: AC
  Administered 2011-08-09: 40 mg via SUBCUTANEOUS

## 2011-08-08 MED ORDER — FAT EMULSION 20 % IV EMUL
250.0000 mL | INTRAVENOUS | Status: DC
  Administered 2011-08-08: 250 mL via INTRAVENOUS
  Filled 2011-08-08: qty 250

## 2011-08-08 MED ORDER — CEFAZOLIN SODIUM 1-5 GM-% IV SOLN
1.0000 g | INTRAVENOUS | Status: AC
  Administered 2011-08-09: 1 g via INTRAVENOUS
  Filled 2011-08-08: qty 50

## 2011-08-08 MED ORDER — INSULIN REGULAR HUMAN 100 UNIT/ML IJ SOLN
INTRAVENOUS | Status: DC
  Administered 2011-08-08: 17:00:00 via INTRAVENOUS
  Filled 2011-08-08: qty 2000

## 2011-08-08 NOTE — Progress Notes (Signed)
PARENTERAL NUTRITION CONSULT NOTE - FOLLOW UP  Pharmacy Consult for TPN  Indication: malnutrition, minimal po intake  Allergies  Allergen Reactions  . Metoclopramide Hcl    Patient Measurements: Height: 4\' 8"  (142.2 cm) Weight: 128 lb 12 oz (58.4 kg) IBW/kg (Calculated) : 36.3   Vital Signs: Temp: 98.3 F (36.8 C) (10/14 0550) Temp src: Oral (10/14 0550) BP: 109/67 mmHg (10/14 0550) Pulse Rate: 78  (10/14 0550) Intake/Output from previous day: 10/13 0701 - 10/14 0700 In: 370 [P.O.:360; I.V.:10] Out: -  Intake/Output from this shift:    Labs:   [facility] 08/07/11 0722  NA 137  K 4.8  CL 100  CO2 33*  GLUCOSE 106*  BUN 28*  CREATININE 0.64  LABCREA --  CREAT24HRUR --  CALCIUM 9.8  MG --  PHOS --  PROT --  ALBUMIN --  AST --  ALT --  ALKPHOS --  BILITOT --  BILIDIR --  IBILI --  PREALBUMIN --  CHOLHDL --  CHOL --   Estimated Creatinine Clearance: 35.9 ml/min (by C-G formula based on Cr of 0.64).    Basename 08/07/11 0714 08/07/11 0420 08/06/11 2359  GLUCAP 107* 106* 119*   Medications:  Scheduled:     . amitriptyline  10 mg Oral QHS  . carbidopa-levodopa  2 tablet Oral TID  . loperamide  2 mg Oral Once  . mirtazapine  15 mg Oral QHS  . pantoprazole  40 mg Oral BID  . PARoxetine  10 mg Oral Daily  . sodium chloride  10 mL Intracatheter Q12H  . DISCONTD: pantoprazole (PROTONIX) IV  40 mg Intravenous Q12H   Insulin Requirements in the past 24 hours:  None in addition to insulin in TPN (SSI d/c'd per MD)  Current Nutrition:  TPN, minimal oral intake  Assessment: Tolerating TPN well at full rate.  CBG's under control (insulin in TPN) Some po intake taking place.  Nutritional Goals:  1500 kCal, 90 grams of protein per day    Plan:  continue TPN at 70 ml per hour (goal rate) w/ insulin in TPN Fat infusion 10 ml per hour F/U labs tomorrow per protocol  Valrie Hart A 08/08/2011,8:35 AM

## 2011-08-09 ENCOUNTER — Encounter (HOSPITAL_COMMUNITY): Payer: Self-pay | Admitting: Anesthesiology

## 2011-08-09 ENCOUNTER — Inpatient Hospital Stay (HOSPITAL_COMMUNITY): Payer: Medicare Other | Admitting: Anesthesiology

## 2011-08-09 ENCOUNTER — Encounter (HOSPITAL_COMMUNITY)
Admission: EM | Disposition: A | Discharge status: Skilled Nursing Facility | Payer: Self-pay | Source: Home / Self Care | Attending: Internal Medicine

## 2011-08-09 HISTORY — PX: LAPAROSCOPIC NISSEN FUNDOPLICATION: SHX1932

## 2011-08-09 HISTORY — PX: GASTROSTOMY: SHX5249

## 2011-08-09 LAB — PREALBUMIN: Prealbumin: 17.7 mg/dL — ABNORMAL LOW (ref 17.0–34.0)

## 2011-08-09 LAB — GLUCOSE, CAPILLARY
Glucose-Capillary: 196 mg/dL — ABNORMAL HIGH (ref 70–99)
Glucose-Capillary: 151 mg/dL — ABNORMAL HIGH (ref 70–99)

## 2011-08-09 LAB — DIFFERENTIAL
Basophils Absolute: 0.1 10*3/uL (ref 0.0–0.1)
Basophils Relative: 2 % — ABNORMAL HIGH (ref 0–1)
Eosinophils Absolute: 0.7 10*3/uL (ref 0.0–0.7)
Neutro Abs: 3.3 10*3/uL (ref 1.7–7.7)
Neutrophils Relative %: 54 % (ref 43–77)

## 2011-08-09 LAB — COMPREHENSIVE METABOLIC PANEL
Alkaline Phosphatase: 63 U/L (ref 39–117)
BUN: 34 mg/dL — ABNORMAL HIGH (ref 6–23)
CO2: 29 mEq/L (ref 19–32)
Chloride: 103 mEq/L (ref 96–112)
Creatinine, Ser: 0.66 mg/dL (ref 0.50–1.10)
GFR calc Af Amer: >90 mL/min (ref >90)
GFR calc non Af Amer: 78 mL/min — ABNORMAL LOW (ref >90)
Glucose, Bld: 84 mg/dL (ref 70–99)
Potassium: 4 mEq/L (ref 3.5–5.1)
Total Bilirubin: 0.2 mg/dL — ABNORMAL LOW (ref 0.3–1.2)

## 2011-08-09 LAB — MAGNESIUM: Magnesium: 1.8 mg/dL (ref 1.5–2.5)

## 2011-08-09 LAB — TRIGLYCERIDES: Triglycerides: 100 mg/dL (ref <150)

## 2011-08-09 LAB — CBC
MCH: 30.8 pg (ref 26.0–34.0)
MCHC: 32.6 g/dL (ref 30.0–36.0)
RDW: 14.3 % (ref 11.5–15.5)

## 2011-08-09 LAB — PHOSPHORUS: Phosphorus: 4.8 mg/dL — ABNORMAL HIGH (ref 2.3–4.6)

## 2011-08-09 LAB — PREPARE RBC (CROSSMATCH)

## 2011-08-09 SURGERY — FUNDOPLICATION, NISSEN, LAPAROSCOPIC
Anesthesia: General | Wound class: Clean

## 2011-08-09 MED ORDER — BUPIVACAINE HCL (PF) 0.5 % IJ SOLN
INTRAMUSCULAR | Status: DC | PRN
  Administered 2011-08-09: 10 mL

## 2011-08-09 MED ORDER — SODIUM CHLORIDE 0.9 % IV SOLN
INTRAVENOUS | Status: DC
  Administered 2011-08-09: 1000 mL via INTRAVENOUS
  Administered 2011-08-09: 12:00:00 via INTRAVENOUS

## 2011-08-09 MED ORDER — ONDANSETRON HCL 4 MG/2ML IJ SOLN: 4.0000 mg | Freq: Once | INTRAMUSCULAR | Status: DC | PRN

## 2011-08-09 MED ORDER — DEXTROSE 10 % IV SOLN
INTRAVENOUS | Status: DC
  Administered 2011-08-09: 09:00:00 via INTRAVENOUS
  Filled 2011-08-09: qty 1000

## 2011-08-09 MED ORDER — EPHEDRINE SULFATE 50 MG/ML IJ SOLN
INTRAMUSCULAR | Status: DC | PRN
  Administered 2011-08-09 (×3): 10 mg via INTRAVENOUS
  Administered 2011-08-09 (×2): 5 mg via INTRAVENOUS

## 2011-08-09 MED ORDER — INSULIN ASPART 100 UNIT/ML ~~LOC~~ SOLN
0.0000 [IU] | SUBCUTANEOUS | Status: DC
  Administered 2011-08-09: 2 [IU] via SUBCUTANEOUS
  Administered 2011-08-10 (×2): 1 [IU] via SUBCUTANEOUS
  Filled 2011-08-09: qty 3

## 2011-08-09 MED ORDER — SUCCINYLCHOLINE CHLORIDE 20 MG/ML IJ SOLN
INTRAMUSCULAR | Status: DC | PRN
  Administered 2011-08-09: 110 mg via INTRAVENOUS

## 2011-08-09 MED ORDER — ONDANSETRON HCL 4 MG/2ML IJ SOLN
4.0000 mg | Freq: Once | INTRAMUSCULAR | Status: AC
  Administered 2011-08-09: 4 mg via INTRAVENOUS

## 2011-08-09 MED ORDER — SUCCINYLCHOLINE CHLORIDE 20 MG/ML IJ SOLN
INTRAMUSCULAR | Status: AC
  Filled 2011-08-09: qty 1

## 2011-08-09 MED ORDER — MIDAZOLAM HCL 2 MG/2ML IJ SOLN
INTRAMUSCULAR | Status: AC
  Administered 2011-08-09: 2 mg via INTRAVENOUS
  Filled 2011-08-09: qty 2

## 2011-08-09 MED ORDER — PAROXETINE HCL 20 MG PO TABS
10.0000 mg | ORAL_TABLET | Freq: Every day | ORAL | Status: DC
  Administered 2011-08-10 – 2011-08-19 (×8): 10 mg via ORAL
  Filled 2011-08-09 (×7): qty 1
  Filled 2011-08-09: qty 2

## 2011-08-09 MED ORDER — GLYCOPYRROLATE 0.2 MG/ML IJ SOLN
INTRAMUSCULAR | Status: AC
  Filled 2011-08-09: qty 1

## 2011-08-09 MED ORDER — PANTOPRAZOLE SODIUM 40 MG IV SOLR
40.0000 mg | INTRAVENOUS | Status: DC
  Administered 2011-08-09 – 2011-08-11 (×3): 40 mg via INTRAVENOUS
  Filled 2011-08-09 (×3): qty 40

## 2011-08-09 MED ORDER — MIDAZOLAM HCL 2 MG/2ML IJ SOLN
1.0000 mg | INTRAMUSCULAR | Status: DC | PRN
  Administered 2011-08-09: 2 mg via INTRAVENOUS

## 2011-08-09 MED ORDER — NEOSTIGMINE METHYLSULFATE 1 MG/ML IJ SOLN
INTRAMUSCULAR | Status: AC
  Filled 2011-08-09: qty 10

## 2011-08-09 MED ORDER — NEOSTIGMINE METHYLSULFATE 1 MG/ML IJ SOLN
INTRAMUSCULAR | Status: DC | PRN
  Administered 2011-08-09 (×2): 1.25 mg via INTRAVENOUS

## 2011-08-09 MED ORDER — ROCURONIUM BROMIDE 100 MG/10ML IV SOLN
INTRAVENOUS | Status: DC | PRN
  Administered 2011-08-09: 20 mg via INTRAVENOUS
  Administered 2011-08-09: 5 mg via INTRAVENOUS
  Administered 2011-08-09: 10 mg via INTRAVENOUS

## 2011-08-09 MED ORDER — ROCURONIUM BROMIDE 50 MG/5ML IV SOLN
INTRAVENOUS | Status: AC
  Filled 2011-08-09: qty 1

## 2011-08-09 MED ORDER — ETOMIDATE 2 MG/ML IV SOLN
INTRAVENOUS | Status: DC | PRN
  Administered 2011-08-09: 8 mg via INTRAVENOUS

## 2011-08-09 MED ORDER — EPHEDRINE SULFATE 50 MG/ML IJ SOLN
INTRAMUSCULAR | Status: AC
  Filled 2011-08-09: qty 1

## 2011-08-09 MED ORDER — ONDANSETRON HCL 4 MG/2ML IJ SOLN
INTRAMUSCULAR | Status: AC
  Administered 2011-08-09: 4 mg via INTRAVENOUS
  Filled 2011-08-09: qty 2

## 2011-08-09 MED ORDER — SODIUM CHLORIDE 0.9 % IR SOLN
Status: DC | PRN
  Administered 2011-08-09: 3000 mL
  Administered 2011-08-09: 1000 mL

## 2011-08-09 MED ORDER — GLYCOPYRROLATE 0.2 MG/ML IJ SOLN
INTRAMUSCULAR | Status: DC | PRN
  Administered 2011-08-09 (×2): 0.2 mg via INTRAVENOUS

## 2011-08-09 MED ORDER — FENTANYL CITRATE 0.05 MG/ML IJ SOLN
INTRAMUSCULAR | Status: AC
  Administered 2011-08-09: 25 ug via INTRAVENOUS
  Filled 2011-08-09: qty 5

## 2011-08-09 MED ORDER — FAT EMULSION 20 % IV EMUL
250.0000 mL | INTRAVENOUS | Status: AC
  Administered 2011-08-09: 250 mL via INTRAVENOUS
  Filled 2011-08-09: qty 250

## 2011-08-09 MED ORDER — BUPIVACAINE HCL (PF) 0.5 % IJ SOLN
INTRAMUSCULAR | Status: AC
  Filled 2011-08-09: qty 30

## 2011-08-09 MED ORDER — MIRTAZAPINE 30 MG PO TABS
15.0000 mg | ORAL_TABLET | Freq: Every day | ORAL | Status: DC
  Administered 2011-08-10 – 2011-08-18 (×7): 15 mg via ORAL
  Filled 2011-08-09 (×3): qty 1
  Filled 2011-08-09: qty 2
  Filled 2011-08-09 (×3): qty 1

## 2011-08-09 MED ORDER — TRACE MINERALS CR-CU-MN-SE-ZN 10-1000-500-60 MCG/ML IV SOLN
INTRAVENOUS | Status: AC
  Administered 2011-08-09: 18:00:00 via INTRAVENOUS
  Filled 2011-08-09: qty 2000

## 2011-08-09 MED ORDER — AMITRIPTYLINE HCL 10 MG PO TABS
10.0000 mg | ORAL_TABLET | Freq: Every day | ORAL | Status: DC
  Administered 2011-08-10 – 2011-08-18 (×7): 10 mg via ORAL
  Filled 2011-08-09 (×7): qty 1

## 2011-08-09 MED ORDER — FENTANYL CITRATE 0.05 MG/ML IJ SOLN
25.0000 ug | INTRAMUSCULAR | Status: DC | PRN
  Administered 2011-08-09: 25 ug via INTRAVENOUS
  Administered 2011-08-09: 50 ug via INTRAVENOUS
  Administered 2011-08-09 (×3): 25 ug via INTRAVENOUS
  Administered 2011-08-09: 50 ug via INTRAVENOUS

## 2011-08-09 MED ORDER — FENTANYL CITRATE 0.05 MG/ML IJ SOLN
INTRAMUSCULAR | Status: DC | PRN
  Administered 2011-08-09 (×2): 50 ug via INTRAVENOUS
  Administered 2011-08-09: 100 ug via INTRAVENOUS
  Administered 2011-08-09: 50 ug via INTRAVENOUS

## 2011-08-09 MED ORDER — ETOMIDATE 2 MG/ML IV SOLN
INTRAVENOUS | Status: AC
  Filled 2011-08-09: qty 10

## 2011-08-09 MED ORDER — ENOXAPARIN SODIUM 40 MG/0.4ML ~~LOC~~ SOLN
SUBCUTANEOUS | Status: AC
  Administered 2011-08-09: 40 mg via SUBCUTANEOUS
  Filled 2011-08-09: qty 0.4

## 2011-08-09 MED ORDER — FENTANYL CITRATE 0.05 MG/ML IJ SOLN
INTRAMUSCULAR | Status: AC
  Administered 2011-08-09: 25 ug via INTRAVENOUS
  Filled 2011-08-09: qty 2

## 2011-08-09 MED ORDER — CEFAZOLIN SODIUM 1-5 GM-% IV SOLN
INTRAVENOUS | Status: AC
  Filled 2011-08-09: qty 50

## 2011-08-09 MED ORDER — MORPHINE SULFATE 2 MG/ML IJ SOLN
1.0000 mg | INTRAMUSCULAR | Status: DC | PRN
  Administered 2011-08-09 – 2011-08-14 (×22): 2 mg via INTRAVENOUS
  Filled 2011-08-09 (×22): qty 1

## 2011-08-09 MED ORDER — CARBIDOPA-LEVODOPA 25-100 MG PO TABS: 2.0000 | ORAL_TABLET | Freq: Three times a day (TID) | ORAL | Status: DC

## 2011-08-09 MED ORDER — PROPOFOL 10 MG/ML IV EMUL
INTRAVENOUS | Status: AC
  Filled 2011-08-09: qty 20

## 2011-08-09 MED ORDER — FENTANYL CITRATE 0.05 MG/ML IJ SOLN
INTRAMUSCULAR | Status: AC
Start: 2011-08-09 — End: 2011-08-09
  Administered 2011-08-09: 25 ug via INTRAVENOUS
  Filled 2011-08-09: qty 2

## 2011-08-09 MED ORDER — CARBIDOPA-LEVODOPA 25-100 MG PO TABS
2.0000 | ORAL_TABLET | Freq: Three times a day (TID) | ORAL | Status: DC
  Administered 2011-08-10 – 2011-08-19 (×21): 2 via ORAL
  Filled 2011-08-09 (×31): qty 2

## 2011-08-09 SURGICAL SUPPLY — 70 items
APL SKNCLS STERI-STRIP NONHPOA (GAUZE/BANDAGES/DRESSINGS) ×2
APPLIER CLIP ROT 10 11.4 M/L (STAPLE)
APPLIER CLIP UNV 5X34 EPIX (ENDOMECHANICALS) IMPLANT
APR CLP MED LRG 11.4X10 (STAPLE)
APR XCLPCLP 20M/L UNV 34X5 (ENDOMECHANICALS)
BAG HAMPER (MISCELLANEOUS) ×3 IMPLANT
BENZOIN TINCTURE PRP APPL 2/3 (GAUZE/BANDAGES/DRESSINGS) ×3 IMPLANT
BLADE SURG SZ11 CARB STEEL (BLADE) ×3 IMPLANT
CLIP APPLIE ROT 10 11.4 M/L (STAPLE) IMPLANT
CLOTH BEACON ORANGE TIMEOUT ST (SAFETY) ×3 IMPLANT
COVER LIGHT HANDLE STERIS (MISCELLANEOUS) ×6 IMPLANT
DECANTER SPIKE VIAL GLASS SM (MISCELLANEOUS) ×3 IMPLANT
DEVICE ASSIST SUT 5MM PUSH BTN (SUTURE) ×1 IMPLANT
DEVICE SUTURE ENDOST 10MM (ENDOMECHANICALS) IMPLANT
DEVICE TROCAR PUNCTURE CLOSURE (ENDOMECHANICALS) ×1 IMPLANT
DISSECTOR BLUNT TIP ENDO 5MM (MISCELLANEOUS) ×2 IMPLANT
DRAPE PROXIMA HALF (DRAPES) ×2 IMPLANT
DURAPREP 26ML APPLICATOR (WOUND CARE) ×3 IMPLANT
ELECT REM PT RETURN 9FT ADLT (ELECTROSURGICAL) ×3
ELECTRODE REM PT RTRN 9FT ADLT (ELECTROSURGICAL) ×2 IMPLANT
FILTER SMOKE EVAC LAPAROSHD (FILTER) ×3 IMPLANT
GLOVE BIOGEL PI IND STRL 7.5 (GLOVE) ×4 IMPLANT
GLOVE BIOGEL PI INDICATOR 7.5 (GLOVE) ×2
GLOVE ECLIPSE 7.0 STRL STRAW (GLOVE) ×3 IMPLANT
GLOVE ECLIPSE 8.0 STRL XLNG CF (GLOVE) ×1 IMPLANT
GLOVE INDICATOR 7.0 STRL GRN (GLOVE) ×1 IMPLANT
GLOVE INDICATOR 8.5 STRL (GLOVE) ×1 IMPLANT
GLOVE SS BIOGEL STRL SZ 6.5 (GLOVE) IMPLANT
GLOVE SUPERSENSE BIOGEL SZ 6.5 (GLOVE) ×1
GOWN BRE IMP SLV AUR XL STRL (GOWN DISPOSABLE) ×8 IMPLANT
GOWN STRL REIN 3XL LVL4 (GOWN DISPOSABLE) ×1 IMPLANT
HAND ACTIVATED (MISCELLANEOUS) ×2 IMPLANT
INST SET LAPROSCOPIC AP (KITS) ×3 IMPLANT
IV NS IRRIG 3000ML ARTHROMATIC (IV SOLUTION) ×3 IMPLANT
KIT ROOM TURNOVER AP CYSTO (KITS) ×3 IMPLANT
MANIFOLD NEPTUNE II (INSTRUMENTS) ×3 IMPLANT
NDL HYPO 25X1 1.5 SAFETY (NEEDLE) ×2 IMPLANT
NDL INSUFFLATION 14GA 120MM (NEEDLE) ×2 IMPLANT
NEEDLE HYPO 25X1 1.5 SAFETY (NEEDLE) ×3 IMPLANT
NEEDLE INSUFFLATION 14GA 120MM (NEEDLE) ×3 IMPLANT
NS IRRIG 1000ML POUR BTL (IV SOLUTION) ×3 IMPLANT
PACK PERI GYN (CUSTOM PROCEDURE TRAY) ×3 IMPLANT
PAD ARMBOARD 7.5X6 YLW CONV (MISCELLANEOUS) ×3 IMPLANT
RELOAD SUT ASSISTANT (SUTURE) ×7 IMPLANT
RETRACTOR LAPSCP 12X46 CVD (ENDOMECHANICALS) ×2 IMPLANT
RETRACTOR MAXI (MISCELLANEOUS) ×1 IMPLANT
RTRCTR LAPSCP 12X46 CVD (ENDOMECHANICALS) ×3
SEALER TISSUE G2 CVD JAW 35 (ENDOMECHANICALS) IMPLANT
SEALER TISSUE G2 CVD JAW 45CM (ENDOMECHANICALS) ×1
SET BASIN LINEN APH (SET/KITS/TRAYS/PACK) ×3 IMPLANT
SET TUBE IRRIG SUCTION NO TIP (IRRIGATION / IRRIGATOR) ×3 IMPLANT
SOLUTION ANTI FOG 6CC (MISCELLANEOUS) ×3 IMPLANT
SPONGE DRAIN TRACH 4X4 STRL 2S (GAUZE/BANDAGES/DRESSINGS) ×1 IMPLANT
SPONGE GAUZE 4X4 12PLY (GAUZE/BANDAGES/DRESSINGS) ×1 IMPLANT
STRIP CLOSURE SKIN 1/2X4 (GAUZE/BANDAGES/DRESSINGS) ×3 IMPLANT
SUT ETHILON 3 0 FSL (SUTURE) ×1 IMPLANT
SUT MNCRL AB 4-0 PS2 18 (SUTURE) ×3 IMPLANT
SUT SURGIDAC NAB ES-9 0 48 120 (SUTURE) ×11 IMPLANT
SUT VIC AB 2-0 CT2 27 (SUTURE) ×4 IMPLANT
SYR CONTROL 10ML LL (SYRINGE) ×3 IMPLANT
SYRINGE 10CC LL (SYRINGE) ×3 IMPLANT
TAPE CLOTH SURG 4X10 WHT LF (GAUZE/BANDAGES/DRESSINGS) ×1 IMPLANT
TRAY FOLEY CATH 14FR (SET/KITS/TRAYS/PACK) ×3 IMPLANT
TROCAR Z-THRD FIOS HNDL 11X100 (TROCAR) ×3 IMPLANT
TROCAR Z-THREAD FIOS 5X100MM (TROCAR) ×1 IMPLANT
TROCAR Z-THREAD SLEEVE 11X100 (TROCAR) ×12 IMPLANT
TUBE TRI FUNNEL REPLAC (TUBING) ×1 IMPLANT
TUBING HI FLO HEAT INSUFFLATOR (IRRIGATION / IRRIGATOR) ×2 IMPLANT
TUBING INSUFFLATION HIGH FLOW (TUBING) ×3 IMPLANT
WARMER LAPAROSCOPE (MISCELLANEOUS) ×1 IMPLANT

## 2011-08-09 NOTE — Op Note (Signed)
Patient:  [name]  DOB:  November 16, 1922  MRN:  [id number]   Preop Diagnosis:  Large hiatal hernia  Postop Diagnosis:  The same  Procedure:  Laparoscopic hiatal hernia repair, laparoscopic gastrostomy tube placement  Surgeon:  Dr. Tilford Pillar  Anes:  Gen. endotracheal  Indications:  Patient is an 77 year old female presented today to [facility] with a history of nausea and vomiting. Her workup was consistent for a large hiatal hernia with the majority of her stomach being localized in the mediastinum. Surgical and nonsurgical options were discussed at length the patient and her family. Their questions were addressed at length. Plans for proceeding with a laparoscopic possible open hiatal hernia repair and gastrostomy tube placement were discussed. Risks benefits and alternatives were discussed at length. A consent was obtained for the planned procedure.  Procedure note:  Patient was taken to the OR was placed in the supine position on the OR table. At this time general anesthetic was administered and the patient was endotracheally intubated by anesthesia. At this point a Foley catheter was placed in standard sterile fashion by the operative staff. He is placed into bilateral yellow thin stirrups in the lithotomy position. Her abdomen was then prepped with DuraPrep solution and draped in standard fashion. A stab incision was created at palmers point in the left subcostal margin through which a Veress needle was inserted and saline drop test was utilized to confirm intraperitoneal placement and the pneumoperitoneum was initiated. Once sufficient pneumoperitoneum was obtained a stab incision was created supraumbilically with an 11 blade scalpel and an 11 mm trocar was inserted over laparoscope allowing visualization the trocar entering into the peritoneal cavity. At this time the inner cannulas were removed the laparoscope was reinserted there is no evidence of any trocar or Veress needle  placement injury. At this point the remaining trochars were placed with an 11 mm car in the right lateral abdominal wall, an 11 mm trocar in the approximate mid clavicular line on the right side as well as on the left side, and an 11 mm from the left lateral abdominal wall. These were all placed under direct visualization. A laparoscopic paddle was brought to the field and was utilized to elevate the left lobe of the liver. This exposed the hiatal defect and demonstrated the stomach entering into the hiatus. With careful manipulation I was able to retract the stomach back into the abdominal cavity. This exposed the right crura which is scored along its edge with the Enseal bipolar device. The peritoneal reflection was then divided anteriorly along the edge of the diaphragm over to the left crura. I identified the esophagus. With careful blunt dissection I was able to create a window behind the esophagus within the mediastinum. A esophageal retractor was then brought up into the field he was carefully placed behind the esophagus. This allowed me to continue my posterior dissection behind the stomach and esophagus.  To help with further visualization at this point I placed an additional 5 mm trocar to allow additional working port. I was able to identify the junction of the right and left crura. In feeling that I had adequately freed the mediastinal attachments of the stomach I turned my attention to closure of the hiatal defect. A 2-0 Ethibonds suture was utilized to close the defect using the Nature conservation officer. 2 sutures were utilized to re approximate the hiatal defect. As quite pleased with the closure and turned attention at this point to performing a Dor fundoplication.  The fundus of the stomach was identified and was brought anteriorly over the esophagus.  An additional suture was placed again using the Ethicon suture assistant pex the fundus of the stomach to the anterior portion of the diaphragm.  Upon completion of this I turned my attention to creation of the gastrostomy. The greater curvature of the stomach was identified and I was able to find apportionment easily reach the anterior abdominal wall at the site of my left midclavicular trocar site. The stomach was pexed to the anterior bowel wall at this point again utilizing the Ethicon suture assistant. A gastrostomy was created with electrocautery just below the suture and a 20 French gastrostomy tube was brought through the trocar sites and manipulated into the stomach. The balloon of the G-tube was inflated and the G-tube was pulled taunt against the abdominal wall.  The G-tube was noted to be a 3 cm at the level of the skin. As quite pleased with the repair and the position of the gastrostomy tube. At this point attention was turned to closure. The pneumoperitoneum was evacuated. The trochars were removed. A 2-0 Vicryl was utilized to reapproximate the skin edges at all the 11 mm car sites. A 3-0 nylon was utilized to attach the G-tube to the skin of the abdominal wall. The skin edges of the remaining trochars were then reapproximated using a 4-0 Monocryl in a running suture. The skin was washed and dried moist dry towel. Benzoin is applied around the incisions. Half-inch Steri-Strips are placed. The drapes removed the patient was allowed to come out of general anesthetic and was transferred to the PACU in stable condition. At the conclusion of the procedure all instrument sponge and needle counts are correct. The patient tolerated procedure extremely well. Complications:  None  EBL:  50 ML's  Specimen:  None

## 2011-08-09 NOTE — Progress Notes (Signed)
NAMEJONELLE, BANN                 ACCOUNT NO.:  [id number]  MEDICAL RECORD NO.:  [id number]  LOCATION:  A316                          FACILITY:  [facility]  PHYSICIAN:  Kingsley Callander. [name], MD       DATE OF BIRTH:  [birth year]  DATE OF PROCEDURE:  08/08/2011 DATE OF DISCHARGE:                                PROGRESS NOTE   Ms. Wilford experienced diarrhea last night.  A Clostridium difficile toxin was sent off and is negative.  Other stool studies are pending.  She is not experiencing any diarrhea this morning.  She has been able to eat some oatmeal for breakfast.  PHYSICAL EXAMINATION:  VITAL SIGNS:  Temperature is 98.3 with a pulse of 78, respirations 18, and blood pressure 109/67.  Her oxygen saturation is 94% on room air. LUNGS:  Clear. HEART:  Regular with no murmurs. ABDOMEN:  Soft and nontender with no hepatosplenomegaly. EXTREMITIES:  Her right arm PICC line site looks fine.  IMPRESSION/PLAN: 1. Large hiatal hernia with recurrent nausea and vomiting.  The plan     is in place to proceed with hiatal hernia repair tomorrow by Dr.     Leticia Penna.  There appeared to be no absolute contraindications,     although she is clearly at high risk. 2. Malnutrition.  Continue TPN.  Metabolic profile yesterday revealed     a potassium of 4.8 with a BUN and creatinine of 28 and 0.64.     Glucoses have been stable.  Again Accu-Cheks can be discontinued. 3. Parkinson disease, stable.  Continue Sinemet.     Kingsley Callander. [name], MD     ROF/MEDQ  D:  08/08/2011  T:  08/09/2011  Job:  [id number]

## 2011-08-09 NOTE — Anesthesia Postprocedure Evaluation (Addendum)
  Anesthesia Post-op Note  Patient: [name]  Procedure(s) Performed:  LAPAROSCOPIC NISSEN FUNDOPLICATION - Laparoscopic Hiatal Hernia Repair; GASTROSTOMY - Laparoscopic Gastrostomy Tube placement  Patient Location: PACU  Anesthesia Type: General  Level of Consciousness: alert  and sedated  Airway and Oxygen Therapy: Patient Spontanous Breathing and Patient connected to face mask oxygen  Post-op Pain: mild  Post-op Assessment: Post-op Vital signs reviewed, Patient's Cardiovascular Status Stable and Respiratory Function Stable  Post-op Vital Signs: Reviewed  Complications: No apparent anesthesia complications 08/10/11  VSS.  Pt alert and oriented.  Complains of sore throat, but improving.  Denies recall, PONV.  No apparent anesthesia complications.

## 2011-08-09 NOTE — Anesthesia Preprocedure Evaluation (Addendum)
Anesthesia Evaluation  Name, MR# and DOB Patient awake  General Assessment Comment  Reviewed: Allergy & Precautions, H&P , NPO status , Patient's Chart, lab work & pertinent test results  History of Anesthesia Complications Negative for: history of anesthetic complications  Airway Mallampati: II      Dental  (+) Edentulous Upper   Pulmonary  Hx PE 05/2011 Vena Cava filter placed.   Pulmonary exam normal       Cardiovascular hypertension, Regular Normal    Neuro/Psych PSYCHIATRIC DISORDERS Depression Parkinson's    GI/Hepatic hiatal hernia PUD (multiple GI bleeds) GERD Medicated  Endo/Other    Renal/GU      Musculoskeletal   Abdominal   Peds  Hematology   Anesthesia Other Findings   Reproductive/Obstetrics                           Anesthesia Physical Anesthesia Plan  ASA: III  Anesthesia Plan: General   Post-op Pain Management:    Induction: Intravenous, Rapid sequence and Cricoid pressure planned  Airway Management Planned: Oral ETT  Additional Equipment:   Intra-op Plan:   Post-operative Plan: Extubation in OR  Informed Consent: I have reviewed the patients History and Physical, chart, labs and discussed the procedure including the risks, benefits and alternatives for the proposed anesthesia with the patient or authorized representative who has indicated his/her understanding and acceptance.     Plan Discussed with:   Anesthesia Plan Comments: (Amidate induction )        Anesthesia Quick Evaluation

## 2011-08-09 NOTE — Progress Notes (Signed)
[name], [name]                 ACCOUNT NO.:  [id number]  MEDICAL RECORD NO.:  [id number]  LOCATION:  IC03                          FACILITY:  [facility]  PHYSICIAN:  Kingsley Callander. [name], MD       DATE OF BIRTH:  11-07-1922  DATE OF PROCEDURE:  08/09/2011 DATE OF DISCHARGE:                                PROGRESS NOTE   [name] is comfortable this morning.  She has had no vomiting overnight.  She is ready to proceed with her surgery today.  PHYSICAL EXAMINATION:  VITAL SIGNS:  Her temperature is 97.5 with a pulse of 74, respirations 20, blood pressure 109/68 and an oxygen saturation 96% on room air. LUNGS:  Clear. HEART:  Regular with no murmurs. ABDOMEN:  Soft and nontender. EXTREMITIES:  A stable-appearing PIC site in the right arm with no swelling of the arm.  IMPRESSION/PLAN:  Large hiatal hernia with recurrent nausea and vomiting.  PLAN: 1. To proceed with hiatal hernia repair today by Dr. Leticia Penna.  This     was discussed further with her daughter. 2. Malnutrition.  Total protein is 5.9 with an albumin of 2.6.     Continue TPN.  Glucose is 78, potassium is 4.0, creatinine 0.66,     hemoglobin is 9.5, cholesterol is 124 with a triglyceride level of     100. 3. Parkinson disease stable. 4. History of pulmonary embolus status post vena cava filter     placement. softly.     Kingsley Callander. [name], MD     ROF/MEDQ  D:  08/09/2011  T:  08/09/2011  Job:  [id number]

## 2011-08-09 NOTE — Progress Notes (Signed)
  Patient seen and evaluated in the pre-op holding area.  No change in physical exam.  Risks, benefits and alternatives again discussed with the patient and the family.  They express understanding and will plan to proceed.  DNR will be suspended during the perioperative period.

## 2011-08-09 NOTE — Anesthesia Procedure Notes (Addendum)
Procedure Name: Intubation Date/Time: 08/09/2011 9:41 AM Performed by: Marylene Buerger Oxygen Delivery Method: Circle System Utilized Preoxygenation: Pre-oxygenation with 100% oxygen   Date/Time: 08/09/2011 9:50 AM Performed by: Marylene Buerger Intubation Type: IV induction, Rapid sequence and Circoid Pressure applied Laryngoscope Size: Mac and 3 Grade View: Grade I Tube size: 7.0 mm Number of attempts: 1 Placement Confirmation: ETT inserted through vocal cords under direct vision,  breath sounds checked- equal and bilateral and positive ETCO2 Secured at: 20 cm Tube secured with: Tape Dental Injury: Teeth and Oropharynx as per pre-operative assessment

## 2011-08-09 NOTE — Transfer of Care (Signed)
Immediate Anesthesia Transfer of Care Note  Patient: [name]  Procedure(s) Performed:  LAPAROSCOPIC NISSEN FUNDOPLICATION - Laparoscopic Hiatal Hernia Repair; GASTROSTOMY - Laparoscopic Gastrostomy Tube placement  Patient Location: PACU  Anesthesia Type: General  Level of Consciousness: awake and sedated  Airway & Oxygen Therapy: Patient Spontanous Breathing and Patient connected to face mask oxygen  Post-op Assessment: Report given to PACU RN, Post -op Vital signs reviewed and stable and Patient moving all extremities  Post vital signs: Reviewed  Complications: No apparent anesthesia complications

## 2011-08-10 LAB — COMPREHENSIVE METABOLIC PANEL
AST: 41 U/L — ABNORMAL HIGH (ref 0–37)
BUN: 44 mg/dL — ABNORMAL HIGH (ref 6–23)
CO2: 24 mEq/L (ref 19–32)
Calcium: 9 mg/dL (ref 8.4–10.5)
Creatinine, Ser: 0.87 mg/dL (ref 0.50–1.10)
GFR calc Af Amer: 68 mL/min — ABNORMAL LOW (ref >90)
GFR calc non Af Amer: 59 mL/min — ABNORMAL LOW (ref >90)

## 2011-08-10 LAB — GLUCOSE, CAPILLARY
Glucose-Capillary: 122 mg/dL — ABNORMAL HIGH (ref 70–99)
Glucose-Capillary: 114 mg/dL — ABNORMAL HIGH (ref 70–99)
Glucose-Capillary: 120 mg/dL — ABNORMAL HIGH (ref 70–99)

## 2011-08-10 LAB — CBC
HCT: 27.7 % — ABNORMAL LOW (ref 36.0–46.0)
MCH: 31.1 pg (ref 26.0–34.0)
MCV: 95.8 fL (ref 78.0–100.0)
Platelets: 151 10*3/uL (ref 150–400)
RBC: 2.89 MIL/uL — ABNORMAL LOW (ref 3.87–5.11)

## 2011-08-10 LAB — MRSA PCR SCREENING: MRSA by PCR: POSITIVE — AB

## 2011-08-10 MED ORDER — INSULIN REGULAR HUMAN 100 UNIT/ML IJ SOLN
INTRAVENOUS | Status: AC
  Administered 2011-08-10: 18:00:00 via INTRAVENOUS
  Filled 2011-08-10 (×2): qty 2000

## 2011-08-10 MED ORDER — MUPIROCIN 2 % EX OINT
1.0000 "application " | TOPICAL_OINTMENT | Freq: Two times a day (BID) | CUTANEOUS | Status: AC
  Administered 2011-08-10 – 2011-08-14 (×10): 1 via NASAL
  Filled 2011-08-10: qty 22

## 2011-08-10 MED ORDER — FAT EMULSION 20 % IV EMUL
250.0000 mL | INTRAVENOUS | Status: AC
  Administered 2011-08-10: 250 mL via INTRAVENOUS
  Filled 2011-08-10 (×2): qty 250

## 2011-08-10 MED ORDER — CHLORHEXIDINE GLUCONATE CLOTH 2 % EX PADS
6.0000 | MEDICATED_PAD | Freq: Every day | CUTANEOUS | Status: AC
  Administered 2011-08-11 – 2011-08-15 (×5): 6 via TOPICAL

## 2011-08-10 NOTE — Progress Notes (Signed)
NAMEMERIDITH, ROMICK                 ACCOUNT NO.:  [id number]  MEDICAL RECORD NO.:  [id number]  LOCATION:  IC03                          FACILITY:  [facility]  PHYSICIAN:  Kingsley Callander. [name], MD       DATE OF BIRTH:  [birth year]  DATE OF PROCEDURE: DATE OF DISCHARGE:                                PROGRESS NOTE   Ms. Connaughton is comfortable this morning.  She has had no difficulties overnight.  She has some soreness, but is not having significant pain at this point.  Her TPN was restarted last night.  VITAL SIGNS:  Blood pressure is 113/64 with a pulse of 92, oxygen saturation is 98% on 2 L.  Temperature is 98.9, respirations 15. LUNGS:  Clear. HEART:  Regular with no murmurs. ABDOMEN:  Reveals the gastrostomy tube in place and her last laparoscopy wounds are Steri-Stripped.  Abdomen is soft and nondistended. EXTREMITIES:  Reveal a stable PICC line site in the right upper arm.  IMPRESSION/PLAN: 1. Postop day #1 status post hiatal hernia repair, stable. 2. Malnutrition.  Continue TPN. 3. Parkinson disease.  Continue Sinemet.     Kingsley Callander. [name], MD     ROF/MEDQ  D:  08/10/2011  T:  08/10/2011  Job:  [id number]

## 2011-08-10 NOTE — Progress Notes (Signed)
1 Day Post-Op  Subjective: Pain controlled.  No nausea.  No fever or chills.  Mouth is dry.  Objective: Vital signs in last 24 hours: Temp:  [97.6 F (36.4 C)-100 F (37.8 C)] 98.5 F (36.9 C) (10/16 1153) Pulse Rate:  [78-103] 87  (10/16 1320) Resp:  [12-20] 15  (10/16 1320) BP: (49-127)/(33-64) 96/46 mmHg (10/16 1300) SpO2:  [97 %-100 %] 99 % (10/16 1320) Last BM Date: 08/08/11  Intake/Output from previous day: 10/15 0701 - 10/16 0700 In: 2884 [I.V.:1700; TPN:1184] Out: 400 [Urine:350; Blood:50] Intake/Output this shift: Total I/O In: 320 [TPN:320] Out: -   General appearance: alert and no distress Resp: clear to auscultation bilaterally Cardio: regular rate and rhythm GI: soft, expected tenderness.  Dressing c/d/i.  G-tube in place.  Lab Results:  @LABLAST2 (wbc:2,hgb:2,hct:2,plt:2) BMET  Basename 08/10/11 0937 08/09/11 0523  NA 134* 138  K 4.5 4.0  CL 104 103  CO2 24 29  GLUCOSE 121* 84  BUN 44* 34*  CREATININE 0.87 0.66  CALCIUM 9.0 9.5   PT/INR No results found for this basename: LABPROT:2,INR:2 in the last 72 hours ABG No results found for this basename: PHART:2,PCO2:2,PO2:2,HCO3:2 in the last 72 hours  Studies/Results: No results found.  Anti-infectives: Anti-infectives     Start     Dose/Rate Route Frequency Ordered Stop   08/08/11 2345   ceFAZolin (ANCEF) IVPB 1 g/50 mL premix        1 g 100 mL/hr over 30 Minutes Intravenous 60 min pre-op 08/08/11 2345 08/09/11 0955          Assessment/Plan: s/p Procedure(s): LAPAROSCOPIC NISSEN FUNDOPLICATION GASTROSTOMY Start clears.  D/c foley.  Transfer to floor.  LOS: 15 days    Doneshia Hill C 08/10/2011

## 2011-08-10 NOTE — Progress Notes (Signed)
Encounter addended by: Glynn Octave on: 08/10/2011  7:51 AM<BR>     Documentation filed: Notes Section

## 2011-08-10 NOTE — Progress Notes (Signed)
Operative findings noted. Patient is resting comfortably. She has G-tube gravity but no output noted. Patient remains on TPN. Oral feeding per Dr. Leticia Penna.

## 2011-08-10 NOTE — Progress Notes (Signed)
Pt was seen on 08-06-11 for initial PT eval.  She subsequently underwent surgery on 08-09-11 for hernia repair.  We will place PT on hold until new orders to resume are received

## 2011-08-10 NOTE — Progress Notes (Signed)
PARENTERAL NUTRITION CONSULT NOTE - FOLLOW UP  Pharmacy Consult for TPN  Indication: malnutrition, minimal po intake, S/P hiatal hernia repair (08/09/2011)  Allergies  Allergen Reactions  . Metoclopramide Hcl    Patient Measurements: Height: 4\' 8"  (142.2 cm) Weight: 132 lb 0.9 oz (59.9 kg) IBW/kg (Calculated) : 36.3   Vital Signs: Temp: 98.9 F (37.2 C) (10/16 0400) Temp src: Oral (10/16 0400) BP: 89/42 mmHg (10/16 0600) Pulse Rate: 89  (10/16 0600) Intake/Output from previous day: 10/15 0701 - 10/16 0700 In: 1844 [I.V.:1700; TPN:144] Out: 400 [Urine:350; Blood:50] Intake/Output from this shift:    Labs:   [facility] 08/09/11 0523 08/09/11 0515  NA 138 --  K 4.0 --  CL 103 --  CO2 29 --  GLUCOSE 84 --  BUN 34* --  CREATININE 0.66 --  LABCREA -- --  CREAT24HRUR -- --  CALCIUM 9.5 --  MG 1.8 --  PHOS 4.8* --  PROT 5.9* --  ALBUMIN 2.6* --  AST 10 --  ALT <5 --  ALKPHOS 63 --  BILITOT 0.2* --  BILIDIR -- --  IBILI -- --  PREALBUMIN 17.7* --  CHOLHDL -- --  CHOL -- 124   Estimated Creatinine Clearance: 36.4 ml/min (by C-G formula based on Cr of 0.66).    Basename 08/10/11 0735 08/10/11 0439 08/10/11 0045  GLUCAP 114* 122* 123*   Medications:  Scheduled:     . amitriptyline  10 mg Oral QHS  . carbidopa-levodopa  2 tablet Oral TID  . ceFAZolin      . ceFAZolin (ANCEF) IV  1 g Intravenous 60 min Pre-Op  . enoxaparin  40 mg Subcutaneous Once  . ePHEDrine      . etomidate      . glycopyrrolate      . insulin aspart  0-9 Units Subcutaneous Q4H  . mirtazapine  15 mg Oral QHS  . neostigmine      . ondansetron (ZOFRAN) IV  4 mg Intravenous Once  . pantoprazole (PROTONIX) IV  40 mg Intravenous Q24H  . PARoxetine  10 mg Oral Daily  . propofol      . rocuronium      . sodium chloride  10 mL Intracatheter Q12H  . succinylcholine      . DISCONTD: amitriptyline  10 mg Oral QHS  . DISCONTD: carbidopa-levodopa  2 tablet Oral TID  . DISCONTD:  carbidopa-levodopa  2 tablet Oral TID  . DISCONTD: mirtazapine  15 mg Oral QHS  . DISCONTD: pantoprazole  40 mg Oral BID  . DISCONTD: PARoxetine  10 mg Oral Daily   Insulin Requirements in the past 24 hours:  4 units SSI, Total 40 units insulin in TPN  Current Nutrition:  TPN, minimal oral intake  Assessment: Tolerating TPN well at full rate.  CBG's under control (insulin in TPN) Some po intake taking place.  Nutritional Goals:  1500 kCal, 90 grams of protein per day    Plan:  continue TPN at 70 ml per hour (goal rate) w/ insulin in TPN Fat infusion 10 ml per hour BMET in AM  Lamonte Richer R 08/10/2011,8:54 AM

## 2011-08-11 LAB — COMPREHENSIVE METABOLIC PANEL
Albumin: 2.1 g/dL — ABNORMAL LOW (ref 3.5–5.2)
Alkaline Phosphatase: 61 U/L (ref 39–117)
BUN: 42 mg/dL — ABNORMAL HIGH (ref 6–23)
CO2: 25 mEq/L (ref 19–32)
Chloride: 103 mEq/L (ref 96–112)
Creatinine, Ser: 0.85 mg/dL (ref 0.50–1.10)
GFR calc Af Amer: 70 mL/min — ABNORMAL LOW (ref >90)
GFR calc non Af Amer: 60 mL/min — ABNORMAL LOW (ref >90)
Glucose, Bld: 102 mg/dL — ABNORMAL HIGH (ref 70–99)
Potassium: 4.3 mEq/L (ref 3.5–5.1)
Total Bilirubin: 0.3 mg/dL (ref 0.3–1.2)

## 2011-08-11 LAB — CBC
HCT: 25.4 % — ABNORMAL LOW (ref 36.0–46.0)
Hemoglobin: 8.3 g/dL — ABNORMAL LOW (ref 12.0–15.0)
MCV: 96.2 fL (ref 78.0–100.0)
RDW: 14.8 % (ref 11.5–15.5)
WBC: 9.5 10*3/uL (ref 4.0–10.5)

## 2011-08-11 LAB — STOOL CULTURE

## 2011-08-11 MED ORDER — TRACE MINERALS CR-CU-MN-SE-ZN 10-1000-500-60 MCG/ML IV SOLN
INTRAVENOUS | Status: AC
  Administered 2011-08-11: 18:00:00 via INTRAVENOUS
  Filled 2011-08-11 (×2): qty 2000

## 2011-08-11 MED ORDER — FAT EMULSION 20 % IV EMUL
250.0000 mL | INTRAVENOUS | Status: AC
  Administered 2011-08-11: 250 mL via INTRAVENOUS
  Filled 2011-08-11 (×2): qty 250

## 2011-08-11 NOTE — Progress Notes (Addendum)
Follow-up ADULT NUTRITION ASSESSMENT Date: 08/11/2011   Time: 4:19 PM Reason for Assessment: TPN   ASSESSMENT: Female 77 y.o.  Dx: <principal problem not specified>-Vomiting    Past Medical History  Diagnosis Date  . Hypertension   . GERD (gastroesophageal reflux disease)   . Parkinson disease   . Depression   . Pulmonary embolism     Scheduled Meds:    . amitriptyline  10 mg Oral QHS  . carbidopa-levodopa  2 tablet Oral TID  . Chlorhexidine Gluconate Cloth  6 each Topical Q0600  . mirtazapine  15 mg Oral QHS  . mupirocin  1 application Nasal BID  . pantoprazole (PROTONIX) IV  40 mg Intravenous Q24H  . PARoxetine  10 mg Oral Daily  . sodium chloride  10 mL Intracatheter Q12H  . DISCONTD: insulin aspart  0-9 Units Subcutaneous Q4H   Continuous Infusions:    . TPN (CLINIMIX) +/- additives 70 mL/hr at 08/10/11 1600   And  . fat emulsion 250 mL (08/10/11 1600)  . TPN (CLINIMIX) +/- additives 70 mL/hr at 08/11/11 1200   And  . fat emulsion 500 kcal (08/11/11 1200)  . TPN (CLINIMIX) +/- additives     And  . fat emulsion     PRN Meds:.acetaminophen, acetaminophen, alum & mag hydroxide-simeth, morphine injection, ondansetron (ZOFRAN) IV, ondansetron, sodium chloride  Ht: 4\' 8"  (142.2 cm)  Wt: 138 lb 14.2 oz (63 kg)  Ideal Wt: 36.3 kg 40.8 kg % Ideal Wt: 157%  Usual Wt: 120-129# % Usual Wt: 100% Weight change:   Body mass index is 31.14 kg/(m^2).  Filed Vitals:   08/11/11 1000 08/11/11 1100 08/11/11 1200 08/11/11 1600  BP: 123/56   126/59  Pulse: 98 96 100 102  Temp:   98.9 F (37.2 C) 97.8 F (36.6 C)  TempSrc:   Oral Oral  Resp: 29 19  24   Height:      Weight:      SpO2: 96% 93% 94% 99%    Food/Nutrition Related Hx: Pt up in chair reports no nausea currently. BM this am. Skin intact. No recent significant wt changes. She reports decr appetite, N/V 24 hr prior to admission.Pt diet adv to full liquids now and tol.I suspect pt is malnourished given her  inadequate oral intake >7d. However, no significant wt change or edema noted. TPN has been initiated and adv today to goal rate of 70 ml/hr which is adequate to meet est energy and protein needs.    08-11-11 PT s/p hernia repair 10/15 tol well per MD. Today gastrostomy tube dislodged per RN. She is NPO at this time. TPN (Clinimix 5/15) @ goal rate 70 ml/h, 20% Lipids @ 10 ml/h is providing adequate nutritional intake at this time. Severe wt gain 10#, 7.7% noted x7d.   BMET    Component Value Date/Time   NA 134* 08/11/2011 0435   K 4.3 08/11/2011 0435   CL 103 08/11/2011 0435   CO2 25 08/11/2011 0435   GLUCOSE 102* 08/11/2011 0435   BUN 42* 08/11/2011 0435   CREATININE 0.85 08/11/2011 0435   CALCIUM 8.9 08/11/2011 0435   GFRNONAA 60* 08/11/2011 0435   GFRAA 70* 08/11/2011 0435   CBC    Component Value Date/Time   WBC 9.5 08/11/2011 0435   RBC 2.64* 08/11/2011 0435   HGB 8.3* 08/11/2011 0435   HCT 25.4* 08/11/2011 0435   PLT 164 08/11/2011 0435   MCV 96.2 08/11/2011 0435   MCH 31.4 08/11/2011 0435  MCHC 32.7 08/11/2011 0435   RDW 14.8 08/11/2011 0435   LYMPHSABS 1.3 08/09/2011 0523   MONOABS 0.8 08/09/2011 0523   EOSABS 0.7 08/09/2011 0523   BASOSABS 0.1 08/09/2011 0523   CBG (last 3)   Basename 08/10/11 1632 08/10/11 1132 08/10/11 0735  GLUCAP 113* 120* 114*    Intake/Output Summary (Last 24 hours) at 08/11/11 1619 Last data filed at 08/11/11 1200  Gross per 24 hour  Intake   1740 ml  Output    850 ml  Net    890 ml      Diet Order: NPO  Supplements/Tube Feeding:TPN  IVF:     TPN (CLINIMIX) +/- additives Last Rate: 70 mL/hr at 08/10/11 1600  And   fat emulsion Last Rate: 250 mL (08/10/11 1600)  TPN (CLINIMIX) +/- additives Last Rate: 70 mL/hr at 08/11/11 1200  And   fat emulsion Last Rate: 500 kcal (08/11/11 1200)  TPN (CLINIMIX) +/- additives   And   fat emulsion     Estimated Nutritional Needs:   Kcal:1400-1680 Protein:84-95  grams Fluid:1.4-1.7 L/d  NUTRITION DIAGNOSIS: -Inadequate oral intake (NI-2.1).  Status: Ongoing  RELATED TO:  -altered GI function  AS EVIDENCE BY:  -N/V at admission, pt NPO/Clear Liquids >7d  MONITORING/EVALUATION(Goals): -Pt will meet >75% est energy and protein needs.Goal met. -She will maintain UBW range. Goal not met. -She will tol diet advancement to solid foods without N/V. Goal not met. -Monitor po's, labs and wt changes  EDUCATION NEEDS: -Education not appropriate at this time  INTERVENTION: -TPN (Clinimix 5/15) initiated; adv to goal rate of 70 ml/hr (provides 1193 kcal, 84 grams protein) -20% Fat emulsion @ 10 ml/hr-(provides 500 kcal/d)   Dietitian [phone]  DOCUMENTATION CODES Per approved criteria  -Not Applicable    [name] 08/11/2011, 4:19 PM

## 2011-08-11 NOTE — Progress Notes (Signed)
  Surgical note.  Call regarding gastrostomy tube. Apparently during attempt to transfer, transfer tube was dislodged.  Gastrostomy tube balloon was reported as down. On evaluation of the patient she denies any abdominal pain above her reported pain this morning. No significant drainage is noted at the gastrostomy tube site. Her abdominal exam is unremarkable with no evidence of any peritoneal signs or increased abdominal pain. Abdomen is soft flat and nondistended. At this point patient will be continued in an n.p.o. status. Continued on TPN and IV antibiotics. Unfortunately due to the timing should any symptoms develop patient will need a return trip to the operating room to reposition the G-tube. At this time I feel the stomach is likely pexed enough with the surgical sutures that immediate return to the operating room is not planned. I did inspect the balloon and upon instillation of saline, a small defect was noted. The balloon had been tested prior to placement in the operating room. Additionally no pexing sutures were placed after the balloon was instilled at the time of the operation. I am therefore suspicious that the balloon was defective. Close monitoring the patient will be continued and therefore did wish the patient to stay in the step down unit at least for the next 24-40 hours.

## 2011-08-11 NOTE — Progress Notes (Signed)
Dr. Karilyn Cota in to see patient. Notified of gastrostomy dislodgement. Discussed event with patient's niece. Will continue to monitor patient for now. Dr. Karilyn Cota demonstrated the balloon defect to the patient's niece.

## 2011-08-11 NOTE — Progress Notes (Signed)
PARENTERAL NUTRITION CONSULT NOTE - FOLLOW UP  Pharmacy Consult for TPN  Indication: malnutrition, minimal po intake, S/P hiatal hernia repair (08/09/2011)  Allergies  Allergen Reactions  . Metoclopramide Hcl    Patient Measurements: Height: 4\' 8"  (142.2 cm) Weight: 138 lb 14.2 oz (63 kg) IBW/kg (Calculated) : 36.3   Vital Signs: Temp: 99.1 F (37.3 C) (10/17 0800) Temp src: Oral (10/17 0800) BP: 103/50 mmHg (10/17 0800) Pulse Rate: 87  (10/17 0800) Intake/Output from previous day: 10/16 0701 - 10/17 0700 In: 2110 [P.O.:340; IV Piggyback:10; TPN:1760] Out: 1300 [Urine:1300] Intake/Output from this shift: Total I/O In: 160 [P.O.:80; TPN:80] Out: -   Labs:   [facility] 08/11/11 0435 08/10/11 0937 08/09/11 0523 08/09/11 0515  NA 134* 134* 138 --  K 4.3 4.5 4.0 --  CL 103 104 103 --  CO2 25 24 29  --  GLUCOSE 102* 121* 84 --  BUN 42* 44* 34* --  CREATININE 0.85 0.87 0.66 --  LABCREA -- -- -- --  CREAT24HRUR -- -- -- --  CALCIUM 8.9 9.0 9.5 --  MG -- -- 1.8 --  PHOS -- -- 4.8* --  PROT 5.4* 5.3* 5.9* --  ALBUMIN 2.1* 2.2* 2.6* --  AST 29 41* 10 --  ALT <5 16 <5 --  ALKPHOS 61 53 63 --  BILITOT 0.3 0.4 0.2* --  BILIDIR -- -- -- --  IBILI -- -- -- --  PREALBUMIN -- -- 17.7* --  CHOLHDL -- -- -- --  CHOL -- -- -- 124   Estimated Creatinine Clearance: 35.3 ml/min (by C-G formula based on Cr of 0.85).    Basename 08/10/11 1632 08/10/11 1132 08/10/11 0735  GLUCAP 113* 120* 114*   Medications:  Scheduled:     . amitriptyline  10 mg Oral QHS  . carbidopa-levodopa  2 tablet Oral TID  . Chlorhexidine Gluconate Cloth  6 each Topical Q0600  . mirtazapine  15 mg Oral QHS  . mupirocin  1 application Nasal BID  . pantoprazole (PROTONIX) IV  40 mg Intravenous Q24H  . PARoxetine  10 mg Oral Daily  . sodium chloride  10 mL Intracatheter Q12H  . DISCONTD: insulin aspart  0-9 Units Subcutaneous Q4H   Insulin Requirements in the past 24 hours:  Total 40 units insulin  in TPN  Current Nutrition:  TPN, minimal oral intake  Assessment: Tolerating TPN well at full rate.  CBG's under control (insulin in TPN) Some po intake taking place.  Nutritional Goals:  1500 kCal, 90 grams of protein per day    Plan:  continue TPN at 70 ml per hour (goal rate) w/ insulin in TPN Fat infusion 10 ml per hour Labs per protocol  Valrie Hart A 08/11/2011,9:14 AM

## 2011-08-11 NOTE — Progress Notes (Signed)
[name], [name]                 ACCOUNT NO.:  [id number]  MEDICAL RECORD NO.:  [id number]  LOCATION:  IC03                          FACILITY:  [facility]  PHYSICIAN:  Kingsley Callander. [name], MD       DATE OF BIRTH:  1923/06/03  DATE OF PROCEDURE:  08/11/2011 DATE OF DISCHARGE:                                PROGRESS NOTE   [name] is comfortable this morning.  She has had no vomiting.  She has taken some sips of liquids.  She is afebrile with a temperature of 98.8 with a pulse of 87, respirations 16, and blood pressure of 89/41.  Lungs are clear.  Heart regular with no murmurs.  Abdomen is soft and nontender.  Gastrostomy is in place.  Extremities revealed no edema. Her right arm PICC line site looks fine.  IMPRESSION/PLAN: 1. Postop day #2 status post hiatal hernia repair.  White count was     mildly elevated yesterday at 11.3, but has dropped to 9.5 today.     Her hemoglobin is dropped from 9.0 to 8.3, and her platelet count     is stable at 134,000. 2. Malnutrition.  Continue TPN.  Her albumin has dropped to 2.1 with a     total protein of 5.4.  Sodium is 134, potassium 4.3, calcium 8.9,     glucose is 102. 3. Parkinson disease, stable. 4. Her stool culture reveals no suspicious colonies, but the final     remains pending.     Kingsley Callander. [name], MD     ROF/MEDQ  D:  08/11/2011  T:  08/11/2011  Job:  [id number]

## 2011-08-11 NOTE — Progress Notes (Signed)
Pt's daughter, [name] in to see patient. Discussed the event associated with the dislodgement of the gastrostomy tube. Informed that the plan is to maintain NPO and monitor the patient closely for now.

## 2011-08-11 NOTE — Progress Notes (Signed)
2 Days Post-Op  Subjective: Patient complains of right shoulder pain. No increased abdominal pain. No nausea vomiting. Has tolerated clear liquids.  Objective: Vital signs in last 24 hours: Temp:  [98.4 F (36.9 C)-99.1 F (37.3 C)] 99.1 F (37.3 C) (10/17 0800) Pulse Rate:  [84-98] 87  (10/17 0800) Resp:  [14-21] 19  (10/17 0800) BP: (76-110)/(36-94) 103/50 mmHg (10/17 0800) SpO2:  [96 %-100 %] 98 % (10/17 0800) Weight:  [63 kg (138 lb 14.2 oz)] 138 lb 14.2 oz (63 kg) (10/17 0500) Last BM Date: 08/08/11  Intake/Output from previous day: 10/16 0701 - 10/17 0700 In: 2110 [P.O.:340; IV Piggyback:10; TPN:1760] Out: 1300 [Urine:1300] Intake/Output this shift: Total I/O In: 160 [P.O.:80; TPN:80] Out: -   General appearance: alert and no distress Resp: clear to auscultation bilaterally Cardio: regular rate and rhythm GI: Bowel sounds, soft, flat, expected moderate abdominal wall tenderness. Incisions are clean dry and intact. Steri-Strips are in place. G-tube is in position.   No peritoneal signs.  Lab Results:  @LABLAST2 (wbc:2,hgb:2,hct:2,plt:2) BMET  Basename 08/11/11 0435 08/10/11 0937  NA 134* 134*  K 4.3 4.5  CL 103 104  CO2 25 24  GLUCOSE 102* 121*  BUN 42* 44*  CREATININE 0.85 0.87  CALCIUM 8.9 9.0   PT/INR No results found for this basename: LABPROT:2,INR:2 in the last 72 hours ABG No results found for this basename: PHART:2,PCO2:2,PO2:2,HCO3:2 in the last 72 hours  Studies/Results: No results found.  Anti-infectives: Anti-infectives     Start     Dose/Rate Route Frequency Ordered Stop   08/08/11 2345   ceFAZolin (ANCEF) IVPB 1 g/50 mL premix        1 g 100 mL/hr over 30 Minutes Intravenous 60 min pre-op 08/08/11 2345 08/09/11 0955          Assessment/Plan: s/p Procedure(s): LAPAROSCOPIC NISSEN FUNDOPLICATION GASTROSTOMY Continue to advance diet as tolerated. Increase activity as tolerated. Continue TPN for now.  LOS: 16 days    [name]  C 08/11/2011

## 2011-08-11 NOTE — Progress Notes (Signed)
While transferring patient from chair to bedside commode, gastrostomy tube dislodged. Site cleansed with CHG and dry dressing applied. Dr. Leticia Penna notified, orders received.

## 2011-08-12 LAB — COMPREHENSIVE METABOLIC PANEL
ALT: 13 U/L (ref 0–35)
AST: 19 U/L (ref 0–37)
Albumin: 2.2 g/dL — ABNORMAL LOW (ref 3.5–5.2)
Alkaline Phosphatase: 77 U/L (ref 39–117)
Calcium: 9.1 mg/dL (ref 8.4–10.5)
Potassium: 4 mEq/L (ref 3.5–5.1)
Sodium: 133 mEq/L — ABNORMAL LOW (ref 135–145)
Total Protein: 5.8 g/dL — ABNORMAL LOW (ref 6.0–8.3)

## 2011-08-12 LAB — CBC
Hemoglobin: 8.8 g/dL — ABNORMAL LOW (ref 12.0–15.0)
MCH: 31.8 pg (ref 26.0–34.0)
MCV: 94.6 fL (ref 78.0–100.0)
Platelets: 190 10*3/uL (ref 150–400)
RBC: 2.77 MIL/uL — ABNORMAL LOW (ref 3.87–5.11)
WBC: 11 10*3/uL — ABNORMAL HIGH (ref 4.0–10.5)

## 2011-08-12 MED ORDER — PANTOPRAZOLE SODIUM 40 MG IV SOLR
40.0000 mg | Freq: Every day | INTRAVENOUS | Status: DC
  Administered 2011-08-12 – 2011-08-19 (×8): 40 mg via INTRAVENOUS
  Filled 2011-08-12 (×8): qty 40

## 2011-08-12 MED ORDER — BIOTENE DRY MOUTH MT LIQD
OROMUCOSAL | Status: DC
  Administered 2011-08-12: 1 via OROMUCOSAL
  Administered 2011-08-12: 20:00:00 via OROMUCOSAL
  Administered 2011-08-12: 1 via OROMUCOSAL
  Administered 2011-08-13 – 2011-08-14 (×11): via OROMUCOSAL
  Administered 2011-08-15 (×2): 1 via OROMUCOSAL
  Administered 2011-08-15 – 2011-08-16 (×6): via OROMUCOSAL
  Administered 2011-08-16 – 2011-08-17 (×2): 1 via OROMUCOSAL
  Administered 2011-08-17 (×3): via OROMUCOSAL

## 2011-08-12 MED ORDER — INSULIN REGULAR HUMAN 100 UNIT/ML IJ SOLN
INTRAVENOUS | Status: AC
  Administered 2011-08-12: 18:00:00 via INTRAVENOUS
  Filled 2011-08-12 (×2): qty 2000

## 2011-08-12 MED ORDER — PANTOPRAZOLE SODIUM 40 MG PO TBEC: 40.0000 mg | DELAYED_RELEASE_TABLET | Freq: Every day | ORAL | Status: DC

## 2011-08-12 MED ORDER — FAT EMULSION 20 % IV EMUL
250.0000 mL | INTRAVENOUS | Status: AC
  Administered 2011-08-12: 250 mL via INTRAVENOUS
  Filled 2011-08-12 (×2): qty 250

## 2011-08-12 NOTE — Progress Notes (Signed)
PARENTERAL NUTRITION CONSULT NOTE - FOLLOW UP  Pharmacy Consult for TPN  Indication: malnutrition, minimal po intake, S/P hiatal hernia repair (08/09/2011)  Allergies  Allergen Reactions  . Metoclopramide Hcl    Patient Measurements: Height: 4\' 8"  (142.2 cm) Weight: 138 lb 10.7 oz (62.9 kg) IBW/kg (Calculated) : 36.3   Vital Signs: Temp: 98 F (36.7 C) (10/18 0800) Temp src: Oral (10/18 0800) BP: 88/38 mmHg (10/18 0800) Pulse Rate: 85  (10/18 0800) Intake/Output from previous day: 10/17 0701 - 10/18 0700 In: 1920 [P.O.:80; TPN:1840] Out: 100 [Urine:100] Intake/Output from this shift:    Labs:   [facility] 08/12/11 0441 08/11/11 0435 08/10/11 0937  NA 133* 134* 134*  K 4.0 4.3 4.5  CL 102 103 104  CO2 24 25 24   GLUCOSE 90 102* 121*  BUN 33* 42* 44*  CREATININE 0.74 0.85 0.87  LABCREA -- -- --  CREAT24HRUR -- -- --  CALCIUM 9.1 8.9 9.0  MG 1.5 -- --  PHOS 2.9 -- --  PROT 5.8* 5.4* 5.3*  ALBUMIN 2.2* 2.1* 2.2*  AST 19 29 41*  ALT 13 <5 16  ALKPHOS 77 61 53  BILITOT 0.4 0.3 0.4  BILIDIR -- -- --  IBILI -- -- --  PREALBUMIN -- -- --  CHOLHDL -- -- --  CHOL -- -- --   Estimated Creatinine Clearance: 37.4 ml/min (by C-G formula based on Cr of 0.74).    Basename 08/10/11 1632 08/10/11 1132 08/10/11 0735  GLUCAP 113* 120* 114*   Medications:  Scheduled:     . amitriptyline  10 mg Oral QHS  . carbidopa-levodopa  2 tablet Oral TID  . Chlorhexidine Gluconate Cloth  6 each Topical Q0600  . mirtazapine  15 mg Oral QHS  . mupirocin  1 application Nasal BID  . pantoprazole  40 mg Oral QAC breakfast  . PARoxetine  10 mg Oral Daily  . sodium chloride  10 mL Intracatheter Q12H  . DISCONTD: pantoprazole (PROTONIX) IV  40 mg Intravenous Q24H   Insulin Requirements in the past 24 hours:  Total 40 units insulin in TPN  Current Nutrition:  TPN, minimal oral intake  Assessment: Tolerating TPN well at full rate.  CBG's under control (insulin in TPN) Some po  intake taking place.  Nutritional Goals:  1500 kCal, 90 grams of protein per day    Plan:  continue TPN at 70 ml per hour (goal rate) w/ insulin in TPN Fat infusion 10 ml per hour Labs per protocol  Valrie Hart A 08/12/2011,10:17 AM

## 2011-08-12 NOTE — Progress Notes (Signed)
NAMEDAVIONA, HERBERT                 ACCOUNT NO.:  [id number]  MEDICAL RECORD NO.:  [id number]  LOCATION:  IC03                          FACILITY:  [facility]  PHYSICIAN:  Kingsley Callander. [name], MD       DATE OF BIRTH:  1922-12-17  DATE OF PROCEDURE:  08/12/2011 DATE OF DISCHARGE:                                PROGRESS NOTE   Ms. Johannesen' gastrostomy tube came out yesterday, evidently the bolt was felt to be defective.  It was not inflated.  She denies any significant abdominal pain today.  PHYSICAL EXAMINATION:  VITAL SIGNS:  Temperature is 99.6 with a pulse of 84, respirations 18, and blood pressure 121/65. GENERAL:  She is alert and comfortable appearing. LUNGS:  Clear. HEART:  Regular with no murmurs. ABDOMEN:  Soft and minimally tender.  Dressings are dry and intact. Bowel sounds are normal.  IMPRESSION/PLAN: 1. Postop day 3, stable.  Hopefully, she will be able to avoid any     additional surgical intervention regarding the gastrostomy tube.     Her hemoglobin is improved to 8.8, white count is 11000 with a     platelet count of 190,000. 2. Malnutrition.  Continue TPN.  Sodium is 133 with a potassium of     4.0, BUN and creatinine 33 and 0.74, calcium is 9.1.  Glucose is     90. 3. Parkinson disease.  Stable.     Kingsley Callander. [name], MD     ROF/MEDQ  D:  08/12/2011  T:  08/12/2011  Job:  [id number]

## 2011-08-12 NOTE — Progress Notes (Signed)
UR Chart Review Completed  

## 2011-08-12 NOTE — Progress Notes (Signed)
We continue to have PT on "hold".  Please write new orders to resume whenever it is deemed appropriate.  Thanks.

## 2011-08-12 NOTE — Progress Notes (Signed)
3 Days Post-Op  Subjective: Abdominal pain is improved. No nausea or vomiting. No fevers or chills. Patient denies acute issues.  Objective: Vital signs in last 24 hours: Temp:  [97.8 F (36.6 C)-99.6 F (37.6 C)] 97.9 F (36.6 C) (10/18 1200) Pulse Rate:  [39-102] 83  (10/18 1200) Resp:  [17-29] 18  (10/18 1200) BP: (87-126)/(34-80) 103/43 mmHg (10/18 1200) SpO2:  [72 %-100 %] 100 % (10/18 1200) Weight:  [62.9 kg (138 lb 10.7 oz)] 138 lb 10.7 oz (62.9 kg) (10/18 0500) Last BM Date: 08/11/11  Intake/Output from previous day: 10/17 0701 - 10/18 0700 In: 1920 [P.O.:80; TPN:1840] Out: 100 [Urine:100] Intake/Output this shift: Total I/O In: 330 [TPN:330] Out: -   General appearance: alert and no distress GI: Positive bowel sounds, soft, flat, moderate expected tenderness. No peritoneal signs. G-tube site is clean dry and intact. Steri-Strips are covering laparoscopic incisional sites.  Lab Results:  @LABLAST2 (wbc:2,hgb:2,hct:2,plt:2) BMET  Basename 08/12/11 0441 08/11/11 0435  NA 133* 134*  K 4.0 4.3  CL 102 103  CO2 24 25  GLUCOSE 90 102*  BUN 33* 42*  CREATININE 0.74 0.85  CALCIUM 9.1 8.9   PT/INR No results found for this basename: LABPROT:2,INR:2 in the last 72 hours ABG No results found for this basename: PHART:2,PCO2:2,PO2:2,HCO3:2 in the last 72 hours  Studies/Results: No results found.  Anti-infectives: Anti-infectives     Start     Dose/Rate Route Frequency Ordered Stop   08/08/11 2345   ceFAZolin (ANCEF) IVPB 1 g/50 mL premix        1 g 100 mL/hr over 30 Minutes Intravenous 60 min pre-op 08/08/11 2345 08/09/11 0955          Assessment/Plan: s/p Procedure(s): LAPAROSCOPIC NISSEN FUNDOPLICATION GASTROSTOMY Continue and he is status 2 to dislodge the G-tube. Her exam remains benign and pending her progression we'll consider doing an upper GI tomorrow to evaluate closure of the gastrostomy tube site. With this is closed I will plan to start clear  liquid diet again tomorrow as tolerated. Due to potential acute progression patient will be continued to be monitored closely in the step down unit although overall she is improving well.  LOS: 17 days    Tavarious Freel C 08/12/2011

## 2011-08-13 ENCOUNTER — Inpatient Hospital Stay (HOSPITAL_COMMUNITY): Payer: Medicare Other

## 2011-08-13 ENCOUNTER — Encounter (HOSPITAL_COMMUNITY): Payer: Self-pay | Admitting: General Surgery

## 2011-08-13 LAB — TYPE AND SCREEN
Unit division: 0
Unit division: 0

## 2011-08-13 LAB — COMPREHENSIVE METABOLIC PANEL
Albumin: 1.9 g/dL — ABNORMAL LOW (ref 3.5–5.2)
Alkaline Phosphatase: 80 U/L (ref 39–117)
BUN: 31 mg/dL — ABNORMAL HIGH (ref 6–23)
Chloride: 102 mEq/L (ref 96–112)
Creatinine, Ser: 0.78 mg/dL (ref 0.50–1.10)
GFR calc Af Amer: 85 mL/min — ABNORMAL LOW (ref >90)
Glucose, Bld: 92 mg/dL (ref 70–99)
Potassium: 4 mEq/L (ref 3.5–5.1)
Total Bilirubin: 0.5 mg/dL (ref 0.3–1.2)

## 2011-08-13 LAB — CBC
HCT: 24.2 % — ABNORMAL LOW (ref 36.0–46.0)
Hemoglobin: 8 g/dL — ABNORMAL LOW (ref 12.0–15.0)
MCHC: 33.1 g/dL (ref 30.0–36.0)
MCV: 93.8 fL (ref 78.0–100.0)
RDW: 14.1 % (ref 11.5–15.5)

## 2011-08-13 MED ORDER — FAT EMULSION 20 % IV EMUL
250.0000 mL | INTRAVENOUS | Status: AC
  Administered 2011-08-13: 250 mL via INTRAVENOUS
  Filled 2011-08-13: qty 250

## 2011-08-13 MED ORDER — TRACE MINERALS CR-CU-MN-SE-ZN 10-1000-500-60 MCG/ML IV SOLN
INTRAVENOUS | Status: AC
  Administered 2011-08-13: 18:00:00 via INTRAVENOUS
  Filled 2011-08-13: qty 2000

## 2011-08-13 MED ORDER — IOHEXOL 350 MG/ML SOLN
130.0000 mL | Freq: Once | INTRAVENOUS | Status: AC | PRN
  Administered 2011-08-13: 130 mL via ORAL

## 2011-08-13 NOTE — Progress Notes (Signed)
4 Days Post-Op  Subjective: No significant complaints today. She states she has some discomfort but it has improved. No nausea or vomiting. No fevers or chills.  Objective: Vital signs in last 24 hours: Temp:  [98.2 F (36.8 C)-100.6 F (38.1 C)] 98.4 F (36.9 C) (10/19 0800) Pulse Rate:  [78-95] 80  (10/19 1100) Resp:  [18-31] 19  (10/19 1100) BP: (86-120)/(35-92) 107/61 mmHg (10/19 1100) SpO2:  [96 %-100 %] 97 % (10/19 1100) Weight:  [66.6 kg (146 lb 13.2 oz)] 146 lb 13.2 oz (66.6 kg) (10/19 0500) Last BM Date: 08/11/11  Intake/Output from previous day: 10/18 0701 - 10/19 0700 In: 1610 [TPN:1610] Out: -  Intake/Output this shift: Total I/O In: 320 [TPN:320] Out: -   General appearance: alert and no distress GI: Positive bowel sounds, soft, flat, moderate expected tenderness. No diffuse peritoneal signs. Incisions are clean dry and intact.  Lab Results:  @LABLAST2 (wbc:2,hgb:2,hct:2,plt:2) BMET  Basename 08/13/11 0359 08/12/11 0441  NA 133* 133*  K 4.0 4.0  CL 102 102  CO2 26 24  GLUCOSE 92 90  BUN 31* 33*  CREATININE 0.78 0.74  CALCIUM 9.0 9.1   PT/INR No results found for this basename: LABPROT:2,INR:2 in the last 72 hours ABG No results found for this basename: PHART:2,PCO2:2,PO2:2,HCO3:2 in the last 72 hours  Studies/Results: No results found.  Anti-infectives: Anti-infectives     Start     Dose/Rate Route Frequency Ordered Stop   08/08/11 2345   ceFAZolin (ANCEF) IVPB 1 g/50 mL premix        1 g 100 mL/hr over 30 Minutes Intravenous 60 min pre-op 08/08/11 2345 08/09/11 0955          Assessment/Plan: s/p Procedure(s): LAPAROSCOPIC NISSEN FUNDOPLICATION GASTROSTOMY Upper GI is scheduled for today to evaluate the gastrostomy site. Clinically patient is not demonstrating any signs of the potential leak, but as discussed with the patient I wish to obtain radiographic confirmation prior to re\re advance in her diet. If there is no evidence of a  gastric leak on the upper GI Will initiate clear liquids later today. At this point and continue to increase activity as tolerated. Overall patient is doing well.  LOS: 18 days    [name] C 08/13/2011

## 2011-08-13 NOTE — Progress Notes (Signed)
NAMEJEWELLE, WHITNER                 ACCOUNT NO.:  [id number]  MEDICAL RECORD NO.:  [id number]  LOCATION:  IC03                          FACILITY:  [facility]  PHYSICIAN:  Kingsley Callander. [name], MD       DATE OF BIRTH:  [birth year]  DATE OF PROCEDURE: DATE OF DISCHARGE:                                PROGRESS NOTE   Ms. Desrosier is alert this morning.  She complains of some pain in the back of her neck.  She denies abdominal pain.  She has had a maximum temperature of 100.6.  VITAL SIGNS:  Temperature this morning is 99.3 with a pulse of 85, respirations 21, and blood pressure of 103/83, oxygen saturation is 99%. LUNGS:  Clear. HEART:  Regular with no murmurs. ABDOMEN:  Mildly tender in the upper abdomen.  Her wounds appear to be healing well.  Her right arm PICC line site looks fine.  White count today is 7.4 with a hemoglobin of 8.0 and platelets of 204,000.  IMPRESSION/PLAN: 1. Postop day 4 status post Nissen fundoplication.  Continue TPN.  Her     albumin is down to 1.9.  Sodium is 133 with a potassium of 4.0 and     glucose of 92. An upper GI, has been considered to further evaluate     her gastrostomy site. 2. Parkinson disease.  Stable. 3. Low-grade fever.  Continue monitoring.     Kingsley Callander. [name], MD     ROF/MEDQ  D:  08/13/2011  T:  08/13/2011  Job:  [id number]

## 2011-08-13 NOTE — Progress Notes (Signed)
PARENTERAL NUTRITION CONSULT NOTE - FOLLOW UP  Pharmacy Consult for TPN  Indication: malnutrition, minimal po intake, S/P hiatal hernia repair (08/09/2011)  Allergies  Allergen Reactions  . Metoclopramide Hcl    Patient Measurements: Height: 4\' 8"  (142.2 cm) Weight: 146 lb 13.2 oz (66.6 kg) IBW/kg (Calculated) : 36.3   Vital Signs: Temp: 98.4 F (36.9 C) (10/19 0800) Temp src: Oral (10/19 0800) BP: 97/45 mmHg (10/19 1000) Pulse Rate: 82  (10/19 1000) Intake/Output from previous day: 10/18 0701 - 10/19 0700 In: 1610 [TPN:1610] Out: -  Intake/Output from this shift: Total I/O In: 240 [TPN:240] Out: -   Labs:   [facility] 08/13/11 0359 08/12/11 0441 08/11/11 0435  NA 133* 133* 134*  K 4.0 4.0 4.3  CL 102 102 103  CO2 26 24 25   GLUCOSE 92 90 102*  BUN 31* 33* 42*  CREATININE 0.78 0.74 0.85  LABCREA -- -- --  CREAT24HRUR -- -- --  CALCIUM 9.0 9.1 8.9  MG -- 1.5 --  PHOS -- 2.9 --  PROT 5.3* 5.8* 5.4*  ALBUMIN 1.9* 2.2* 2.1*  AST 14 19 29   ALT 14 13 <5  ALKPHOS 80 77 61  BILITOT 0.5 0.4 0.3  BILIDIR -- -- --  IBILI -- -- --  PREALBUMIN -- -- --  CHOLHDL -- -- --  CHOL -- -- --   Estimated Creatinine Clearance: 38.6 ml/min (by C-G formula based on Cr of 0.78).    Basename 08/10/11 1632 08/10/11 1132  GLUCAP 113* 120*   Medications:  Scheduled:     . amitriptyline  10 mg Oral QHS  . antiseptic oral rinse   Mouth Rinse Q4H  . carbidopa-levodopa  2 tablet Oral TID  . Chlorhexidine Gluconate Cloth  6 each Topical Q0600  . mirtazapine  15 mg Oral QHS  . mupirocin  1 application Nasal BID  . pantoprazole (PROTONIX) IV  40 mg Intravenous QAC breakfast  . PARoxetine  10 mg Oral Daily  . sodium chloride  10 mL Intracatheter Q12H  . DISCONTD: pantoprazole  40 mg Oral QAC breakfast   Insulin Requirements in the past 24 hours:  Total 40 units insulin in TPN  Current Nutrition:  TPN, minimal oral intake  Assessment: Tolerating TPN well at full rate.    CBG's under control (insulin in TPN) Some po intake taking place.  Nutritional Goals:  1500 kCal, 90 grams of protein per day    Plan:  continue TPN at 70 ml per hour (goal rate) w/ insulin in TPN Fat infusion 10 ml per hour Labs per protocol Reduce Insulin to 30 units per bag  Mady Gemma 08/13/2011,11:11 AM

## 2011-08-13 NOTE — Progress Notes (Signed)
Paged Dr. Leticia Penna with results of radiology exam. Orders received to start patient on clear liquid diet.

## 2011-08-14 LAB — COMPREHENSIVE METABOLIC PANEL
ALT: 5 U/L (ref 0–35)
AST: 25 U/L (ref 0–37)
Albumin: 2 g/dL — ABNORMAL LOW (ref 3.5–5.2)
Alkaline Phosphatase: 139 U/L — ABNORMAL HIGH (ref 39–117)
CO2: 27 mEq/L (ref 19–32)
Chloride: 99 mEq/L (ref 96–112)
GFR calc non Af Amer: 76 mL/min — ABNORMAL LOW (ref >90)
Potassium: 3.9 mEq/L (ref 3.5–5.1)
Sodium: 132 mEq/L — ABNORMAL LOW (ref 135–145)
Total Bilirubin: 0.4 mg/dL (ref 0.3–1.2)

## 2011-08-14 LAB — CBC
Platelets: 219 10*3/uL (ref 150–400)
RBC: 2.85 MIL/uL — ABNORMAL LOW (ref 3.87–5.11)
RDW: 13.9 % (ref 11.5–15.5)
WBC: 6.9 10*3/uL (ref 4.0–10.5)

## 2011-08-14 MED ORDER — FAT EMULSION 20 % IV EMUL
250.0000 mL | INTRAVENOUS | Status: AC
  Administered 2011-08-14: 250 mL via INTRAVENOUS
  Filled 2011-08-14: qty 250

## 2011-08-14 MED ORDER — INSULIN REGULAR HUMAN 100 UNIT/ML IJ SOLN
INTRAVENOUS | Status: AC
  Administered 2011-08-14: 18:00:00 via INTRAVENOUS
  Filled 2011-08-14: qty 2000

## 2011-08-14 NOTE — Progress Notes (Signed)
PARENTERAL NUTRITION CONSULT NOTE - FOLLOW UP  Pharmacy Consult for TPN  Indication: malnutrition, minimal po intake, S/P hiatal hernia repair (08/09/2011)  Allergies  Allergen Reactions  . Metoclopramide Hcl    Patient Measurements: Height: 4\' 8"  (142.2 cm) Weight: 135 lb 12.9 oz (61.6 kg) IBW/kg (Calculated) : 36.3   Vital Signs: Temp: 98 F (36.7 C) (10/20 0400) Temp src: Oral (10/20 0400) BP: 126/62 mmHg (10/20 0800) Pulse Rate: 91  (10/20 0800) Intake/Output from previous day: 10/19 0701 - 10/20 0700 In: 1930 [I.V.:10; TPN:1920] Out: -  Intake/Output from this shift: Total I/O In: 330 [P.O.:250; TPN:80] Out: -   Labs:   Basename 08/14/11 0510 08/13/11 0359 08/12/11 0441  NA 132* 133* 133*  K 3.9 4.0 4.0  CL 99 102 102  CO2 27 26 24   GLUCOSE 122* 92 90  BUN 26* 31* 33*  CREATININE 0.70 0.78 0.74  LABCREA -- -- --  CREAT24HRUR -- -- --  CALCIUM 9.0 9.0 9.1  MG -- -- 1.5  PHOS -- -- 2.9  PROT 5.8* 5.3* 5.8*  ALBUMIN 2.0* 1.9* 2.2*  AST 25 14 19   ALT 5 14 13   ALKPHOS 139* 80 77  BILITOT 0.4 0.5 0.4  BILIDIR -- -- --  IBILI -- -- --  PREALBUMIN -- -- --  CHOLHDL -- -- --  CHOL -- -- --   Estimated Creatinine Clearance: 37 ml/min (by C-G formula based on Cr of 0.7).   No results found for this basename: GLUCAP:3 in the last 72 hours Medications:  Scheduled:     . amitriptyline  10 mg Oral QHS  . antiseptic oral rinse   Mouth Rinse Q4H  . carbidopa-levodopa  2 tablet Oral TID  . Chlorhexidine Gluconate Cloth  6 each Topical Q0600  . mirtazapine  15 mg Oral QHS  . mupirocin  1 application Nasal BID  . pantoprazole (PROTONIX) IV  40 mg Intravenous QAC breakfast  . PARoxetine  10 mg Oral Daily  . sodium chloride  10 mL Intracatheter Q12H   Insulin Requirements in the past 24 hours:  Total 30 units insulin in TPN  Current Nutrition:  TPN, minimal oral intake  Assessment: Tolerating TPN well at full rate.  CBG's under control (insulin in  TPN) Some po intake taking place - tolerating clear liquids per RN.  Nutritional Goals:  1500 kCal, 90 grams of protein per day    Plan:  continue TPN at 70 ml per hour (goal rate) w/ insulin in TPN Fat infusion 10 ml per hour Labs per protocol Reduce Insulin to 30 units per bag  Lamonte Richer R 08/14/2011,9:13 AM

## 2011-08-14 NOTE — Progress Notes (Signed)
5 Days Post-Op  Subjective: Feels pretty good.  No nausea.  No fevers or chills.  No issues with clears.  Objective: Vital signs in last 24 hours: Temp:  [98 F (36.7 C)-99.2 F (37.3 C)] 98 F (36.7 C) (10/20 0400) Pulse Rate:  [77-97] 90  (10/20 1200) Resp:  [17-23] 20  (10/20 1200) BP: (74-143)/(43-103) 137/65 mmHg (10/20 1200) SpO2:  [87 %-100 %] 99 % (10/20 1200) Weight:  [61.6 kg (135 lb 12.9 oz)] 135 lb 12.9 oz (61.6 kg) (10/20 0500) Last BM Date: 08/13/11  Intake/Output from previous day: 10/19 0701 - 10/20 0700 In: 1930 [I.V.:10; TPN:1920] Out: -  Intake/Output this shift: Total I/O In: 870 [P.O.:470; TPN:400] Out: -   General appearance: alert and no distress GI: +BS, soft, expected tenderness.  Incisions c/d/i.  No peritoneal signs.  Lab Results:  @LABLAST2 (wbc:2,hgb:2,hct:2,plt:2) BMET  Basename 08/14/11 0510 08/13/11 0359  NA 132* 133*  K 3.9 4.0  CL 99 102  CO2 27 26  GLUCOSE 122* 92  BUN 26* 31*  CREATININE 0.70 0.78  CALCIUM 9.0 9.0   PT/INR No results found for this basename: LABPROT:2,INR:2 in the last 72 hours ABG No results found for this basename: PHART:2,PCO2:2,PO2:2,HCO3:2 in the last 72 hours  Studies/Results: Dg Ugi W/water Sol Cm  08/13/2011  *RADIOLOGY REPORT*  Clinical Data:Gastrostomy tube placement 4 days ago, with to the accidentally dislodged, question intra abdominal leak  WATER SOLUBLE UPPER GI SERIES  Technique:  Single-column upper GI series was performed using water soluble contrast.  Fluoroscopy Time: 2.8 minutes  Contrast: 130 ml Omnipaque 350  Comparison: None  Findings: Normal bowel gas pattern on scout film. Marked dextroconvex thoracolumbar scoliosis apex L1 with severe multilevel degenerative disc and facet disease changes. Osseous demineralization and IVC filter noted. Normal esophageal distention grossly visualized. Severe diffuse impairment of esophageal motility. Small hiatal hernia. Stomach distends without mass or  ulceration. No gastric outlet obstruction. Rugal folds are grossly normal in appearance for an under distended stomach. No contrast extravasation from the stomach seen during the exam. The patient was unable to lie prone for dependent assessment of the anterior wall. With the patient in bilateral lateral and posterior oblique positions, no contrast extravasation was identified. Duodenal and visualized proximal jejunal loops unremarkable.  IMPRESSION: Severe diffuse impairment of esophageal motility. Hiatal hernia. No definite evidence of contrast extravasation from stomach during exam; note that patient was unable to lie prone for dependent assessment of the anterior gastric wall.  Original Report Authenticated By: Lollie Marrow, M.D.    Anti-infectives: Anti-infectives     Start     Dose/Rate Route Frequency Ordered Stop   08/08/11 2345   ceFAZolin (ANCEF) IVPB 1 g/50 mL premix        1 g 100 mL/hr over 30 Minutes Intravenous 60 min pre-op 08/08/11 2345 08/09/11 0955          Assessment/Plan: s/p Procedure(s): LAPAROSCOPIC NISSEN FUNDOPLICATION GASTROSTOMY No evidence of gastric leak.  WIll advance diet.  If tolerates fulls would wean of TPN.  Start fulls.  Ok to transfer to 300.  LOS: 19 days    Athanasia Stanwood C 08/14/2011

## 2011-08-14 NOTE — Progress Notes (Signed)
Pt to be transferred to room 322 per MD order. Report called to RN. Pt to be transferred via bed.

## 2011-08-14 NOTE — Progress Notes (Signed)
[name], [name]                 ACCOUNT NO.:  [id number]  MEDICAL RECORD NO.:  [id number]  LOCATION:  IC03                          FACILITY:  [facility]  PHYSICIAN:  Kingsley Callander. [name], MD       DATE OF BIRTH:  [birth year]  DATE OF PROCEDURE: DATE OF DISCHARGE:                                PROGRESS NOTE   Ms. [name] is continuing clear liquids today.  She denies any vomiting or abdominal pain.  VITAL SIGNS:  Temperature 98, pulse 91 and regular, respirations 18, blood pressure 126/62, oxygen saturation 98% on supplemental oxygen. GENERAL:  She appears comfortable.  She is alert. LUNGS:  Clear. HEART:  Regular with no murmurs.  ABDOMEN:  Normal bowel sounds.  No distention or tenderness.  Her laparoscopy wounds are healing well. EXTREMITIES:  A stable PICC line site in the right upper arm.  IMPRESSION/PLAN: 1. Postop day number 5 status post Nissen fundoplication.  Her upper     GI yesterday revealed no evidence of extravasation of contrast     where her gastrostomy tube was inadvertently removed.  Her white     count today is 6.9, hemoglobin is up from 8 to 8.9. 2. Malnutrition.  We will continue to advance her diet per Surgery.     Continue TPN.  Serum sodium today is 132 with a potassium of 3.9,     and a calcium of 9.0.  Her glucose is 122, total protein is 5.8     with an albumin of 2.0.     Kingsley Callander. [name], MD     ROF/MEDQ  D:  08/14/2011  T:  08/14/2011  Job:  [id number]

## 2011-08-15 LAB — COMPREHENSIVE METABOLIC PANEL
Albumin: 2 g/dL — ABNORMAL LOW (ref 3.5–5.2)
BUN: 23 mg/dL (ref 6–23)
Calcium: 9 mg/dL (ref 8.4–10.5)
Chloride: 101 mEq/L (ref 96–112)
Creatinine, Ser: 0.66 mg/dL (ref 0.50–1.10)
GFR calc non Af Amer: 78 mL/min — ABNORMAL LOW (ref >90)
Total Bilirubin: 0.4 mg/dL (ref 0.3–1.2)

## 2011-08-15 LAB — CBC
HCT: 26 % — ABNORMAL LOW (ref 36.0–46.0)
MCH: 31.1 pg (ref 26.0–34.0)
MCV: 92.9 fL (ref 78.0–100.0)
Platelets: 228 10*3/uL (ref 150–400)
RDW: 14 % (ref 11.5–15.5)
WBC: 6.8 10*3/uL (ref 4.0–10.5)

## 2011-08-15 NOTE — Progress Notes (Signed)
6 Days Post-Op  Subjective: Patient complains of some nausea. She has tolerated full liquids however. No significant abdominal pain. No fevers or chills. She has had a bowel movement.  Objective: Vital signs in last 24 hours: Temp:  [98.1 F (36.7 C)-98.5 F (36.9 C)] 98.1 F (36.7 C) (10/21 0524) Pulse Rate:  [84-90] 84  (10/21 0524) Resp:  [18-28] 18  (10/21 0524) BP: (103-150)/(59-72) 103/59 mmHg (10/21 0524) SpO2:  [99 %-100 %] 100 % (10/21 0524) Weight:  [64.5 kg (142 lb 3.2 oz)] 142 lb 3.2 oz (64.5 kg) (10/21 0357) Last BM Date: 08/14/11  Intake/Output from previous day: 10/20 0701 - 10/21 0700 In: 5227 [P.O.:470; TPN:4757] Out: -  Intake/Output this shift:    General appearance: alert and no distress GI: Positive bowel sounds, soft, flat, mild abdominal discomfort with deep palpation, no peritoneal signs, incision is clean dry and intact.  Lab Results:  @LABLAST2 (wbc:2,hgb:2,hct:2,plt:2) BMET  Basename 08/15/11 0716 08/14/11 0510  NA 134* 132*  K 3.9 3.9  CL 101 99  CO2 29 27  GLUCOSE 112* 122*  BUN 23 26*  CREATININE 0.66 0.70  CALCIUM 9.0 9.0   PT/INR No results found for this basename: LABPROT:2,INR:2 in the last 72 hours ABG No results found for this basename: PHART:2,PCO2:2,PO2:2,HCO3:2 in the last 72 hours  Studies/Results: Dg Ugi W/water Sol Cm  08/13/2011  *RADIOLOGY REPORT*  Clinical Data:Gastrostomy tube placement 4 days ago, with to the accidentally dislodged, question intra abdominal leak  WATER SOLUBLE UPPER GI SERIES  Technique:  Single-column upper GI series was performed using water soluble contrast.  Fluoroscopy Time: 2.8 minutes  Contrast: 130 ml Omnipaque 350  Comparison: None  Findings: Normal bowel gas pattern on scout film. Marked dextroconvex thoracolumbar scoliosis apex L1 with severe multilevel degenerative disc and facet disease changes. Osseous demineralization and IVC filter noted. Normal esophageal distention grossly visualized.  Severe diffuse impairment of esophageal motility. Small hiatal hernia. Stomach distends without mass or ulceration. No gastric outlet obstruction. Rugal folds are grossly normal in appearance for an under distended stomach. No contrast extravasation from the stomach seen during the exam. The patient was unable to lie prone for dependent assessment of the anterior wall. With the patient in bilateral lateral and posterior oblique positions, no contrast extravasation was identified. Duodenal and visualized proximal jejunal loops unremarkable.  IMPRESSION: Severe diffuse impairment of esophageal motility. Hiatal hernia. No definite evidence of contrast extravasation from stomach during exam; note that patient was unable to lie prone for dependent assessment of the anterior gastric wall.  Original Report Authenticated By: Lollie Marrow, M.D.    Anti-infectives: Anti-infectives     Start     Dose/Rate Route Frequency Ordered Stop   08/08/11 2345   ceFAZolin (ANCEF) IVPB 1 g/50 mL premix        1 g 100 mL/hr over 30 Minutes Intravenous 60 min pre-op 08/08/11 2345 08/09/11 0955          Assessment/Plan: s/p Procedure(s): LAPAROSCOPIC NISSEN FUNDOPLICATION GASTROSTOMY Continue to advance diet as tolerated. Wean TPN over the next couple days. Increase activity.  LOS: 20 days    Kailiana Granquist C 08/15/2011

## 2011-08-15 NOTE — Progress Notes (Signed)
PARENTERAL NUTRITION CONSULT NOTE - FOLLOW UP  Pharmacy Consult for TPN  Indication: malnutrition, minimal po intake, S/P hiatal hernia repair (08/09/2011)  Allergies  Allergen Reactions  . Metoclopramide Hcl    Patient Measurements: Height: 4\' 8"  (142.2 cm) Weight: 142 lb 3.2 oz (64.5 kg) IBW/kg (Calculated) : 36.3   Vital Signs: Temp: 98.1 F (36.7 C) (10/21 0524) Temp src: Oral (10/21 0524) BP: 103/59 mmHg (10/21 0524) Pulse Rate: 84  (10/21 0524) Intake/Output from previous day: 10/20 0701 - 10/21 0700 In: 5227 [P.O.:470; TPN:4757] Out: -  Intake/Output from this shift:    Labs:   [facility] 08/15/11 0716 08/14/11 0510 08/13/11 0359  NA 134* 132* 133*  K 3.9 3.9 4.0  CL 101 99 102  CO2 29 27 26   GLUCOSE 112* 122* 92  BUN 23 26* 31*  CREATININE 0.66 0.70 0.78  LABCREA -- -- --  CREAT24HRUR -- -- --  CALCIUM 9.0 9.0 9.0  MG -- -- --  PHOS -- -- --  PROT 5.6* 5.8* 5.3*  ALBUMIN 2.0* 2.0* 1.9*  AST 26 25 14   ALT 5 5 14   ALKPHOS 150* 139* 80  BILITOT 0.4 0.4 0.5  BILIDIR -- -- --  IBILI -- -- --  PREALBUMIN -- -- --  CHOLHDL -- -- --  CHOL -- -- --   Estimated Creatinine Clearance: 37.9 ml/min (by C-G formula based on Cr of 0.66).   No results found for this basename: GLUCAP:3 in the last 72 hours Medications:  Scheduled:     . amitriptyline  10 mg Oral QHS  . antiseptic oral rinse   Mouth Rinse Q4H  . carbidopa-levodopa  2 tablet Oral TID  . Chlorhexidine Gluconate Cloth  6 each Topical Q0600  . mirtazapine  15 mg Oral QHS  . mupirocin  1 application Nasal BID  . pantoprazole (PROTONIX) IV  40 mg Intravenous QAC breakfast  . PARoxetine  10 mg Oral Daily  . sodium chloride  10 mL Intracatheter Q12H   Insulin Requirements in the past 24 hours:  Total 30 units insulin in TPN  Current Nutrition:  TPN and oral intake  Assessment: Tolerating TPN well at full rate.  CBG's under control (insulin in TPN) PO intake taking place - tolerating diet  per RN.  Nutritional Goals:  1500 kCal, 90 grams of protein per day    Plan:  Discontinue TPN today.  Discussed with MD. Reduce rate by 50% and then stop at 18:00. Fat infusion 10 ml per hour Labs per protocol   Lamonte Richer R 08/15/2011,10:29 AM

## 2011-08-15 NOTE — Progress Notes (Signed)
[name], [name]                 ACCOUNT NO.:  [id number]  MEDICAL RECORD NO.:  [id number]  LOCATION:  A322                          FACILITY:  [facility]  PHYSICIAN:  Kingsley Callander. [name], MD       DATE OF BIRTH:  August 14, 1923  DATE OF PROCEDURE:  08/15/2011 DATE OF DISCHARGE:                                PROGRESS NOTE   Ms. Labell has been moved up to the 3rd floor.  She has been comfortable. She denies vomiting.  Her diet has been advanced to full liquids.  She did have significant esophageal dysmotility on her upper GI.  PHYSICAL EXAMINATION:  VITAL SIGNS:  Temperature is 98.1 with a heart rate of 84, respirations 18, blood pressure 103/59, and an oxygen saturation of 100%. LUNGS:  Clear. HEART:  Regular with no murmurs. ABDOMEN:  Soft, nondistended, and nontender.  Her laparoscopy wounds appear to be healing well. EXTREMITIES:  Stable right arm PICC line site.  IMPRESSION/PLAN: 1. Postop Nissen fundoplication and inadvertent removal of a     gastrostomy tube site.  She is stable.  Her diet is being advanced     slowly by Surgery.  White count today is 6.8 with a hemoglobin of     8.7, and platelets of 228,000. 2. Malnutrition.  Albumin is 2.0.  Sodium is 134 with a potassium of     3.9, and calcium 9.0. 3. Parkinson's disease, stable on Sinemet. 4. History of pulmonary embolus status post vena cava filter     placement. 5. Anemia.  She has not necessarily experienced an acute blood loss     anemia.  There is a contact note in the computer regarding     documentation of her current anemia.  She previously lost some     blood from bleeding from her upper GI tract during a prior     hospitalization, but has not experienced that during this episode.     These are multiple potential diagnoses listed by this utilization     nurse do not appear to apply in this particular situation.  This is     more of an anemia of chronic disease type picture at this point.  I     hope this satisfies the  documentation request made in the chart.     Kingsley Callander. [name], MD     ROF/MEDQ  D:  08/15/2011  T:  08/15/2011  Job:  [id number]

## 2011-08-16 LAB — CBC
Hemoglobin: 8.2 g/dL — ABNORMAL LOW (ref 12.0–15.0)
MCV: 92.5 fL (ref 78.0–100.0)
Platelets: 255 10*3/uL (ref 150–400)
RBC: 2.68 MIL/uL — ABNORMAL LOW (ref 3.87–5.11)
WBC: 6.4 10*3/uL (ref 4.0–10.5)

## 2011-08-16 LAB — COMPREHENSIVE METABOLIC PANEL
ALT: <5 U/L (ref 0–35)
AST: 38 U/L — ABNORMAL HIGH (ref 0–37)
CO2: 25 mEq/L (ref 19–32)
Chloride: 104 mEq/L (ref 96–112)
GFR calc Af Amer: >90 mL/min (ref >90)
GFR calc non Af Amer: 79 mL/min — ABNORMAL LOW (ref >90)
Glucose, Bld: 103 mg/dL — ABNORMAL HIGH (ref 70–99)
Sodium: 138 mEq/L (ref 135–145)
Total Bilirubin: 0.4 mg/dL (ref 0.3–1.2)

## 2011-08-16 MED ORDER — ENSURE CLINICAL ST REVIGOR PO LIQD
237.0000 mL | Freq: Two times a day (BID) | ORAL | Status: DC
  Administered 2011-08-17 – 2011-08-19 (×4): 237 mL via ORAL

## 2011-08-16 NOTE — Progress Notes (Signed)
7 Days Post-Op  Subjective: Patient tolerating soft diet without any difficulty. Complaint of leg and joint pain. Denies any abdominal pain or nausea and vomiting. No fevers or chills.   Objective: Vital signs in last 24 hours: Temp:  [98.1 F (36.7 C)-98.4 F (36.9 C)] 98.1 F (36.7 C) (10/22 0600) Pulse Rate:  [84-85] 84  (10/22 0600) Resp:  [18-20] 20  (10/22 0600) BP: (114-135)/(71-77) 114/71 mmHg (10/22 0600) SpO2:  [95 %-100 %] 95 % (10/22 0600) Last BM Date: 08/14/11  Intake/Output from previous day: 10/21 0701 - 10/22 0700 In: 1323 [TPN:1323] Out: 0  Intake/Output this shift:    General appearance: alert and no distress GI: Positive bowel sounds, soft, flat, and mild tenderness to deep palpation. No peritoneal signs. All incisions are clean dry and intact. Nylon suture removed.  Lab Results:  @LABLAST2 (wbc:2,hgb:2,hct:2,plt:2) BMET  Basename 08/16/11 0738 08/15/11 0716  NA 138 134*  K 3.8 3.9  CL 104 101  CO2 25 29  GLUCOSE 103* 112*  BUN 18 23  CREATININE 0.62 0.66  CALCIUM 9.0 9.0   PT/INR No results found for this basename: LABPROT:2,INR:2 in the last 72 hours ABG No results found for this basename: PHART:2,PCO2:2,PO2:2,HCO3:2 in the last 72 hours  Studies/Results: No results found.  Anti-infectives: Anti-infectives     Start     Dose/Rate Route Frequency Ordered Stop   08/08/11 2345   ceFAZolin (ANCEF) IVPB 1 g/50 mL premix        1 g 100 mL/hr over 30 Minutes Intravenous 60 min pre-op 08/08/11 2345 08/09/11 0955          Assessment/Plan: s/p Procedure(s): LAPAROSCOPIC NISSEN FUNDOPLICATION GASTROSTOMY Continue to wean TPN to off. Once TPN is off patient is stable from my standpoint to consider rehabilitation placement. Overall patient is doing much better.  LOS: 21 days    Rudolfo Brandow C 08/16/2011

## 2011-08-16 NOTE — Progress Notes (Addendum)
Follow-up ADULT NUTRITION ASSESSMENT Date: 08/16/2011   Time: 1:27 PM Reason for Assessment: TPN   ASSESSMENT: Female 77 y.o.  Dx: <principal problem not specified>-Vomiting    Past Medical History  Diagnosis Date  . Hypertension   . GERD (gastroesophageal reflux disease)   . Parkinson disease   . Depression   . Pulmonary embolism     Scheduled Meds:    . amitriptyline  10 mg Oral QHS  . antiseptic oral rinse   Mouth Rinse Q4H  . carbidopa-levodopa  2 tablet Oral TID  . mirtazapine  15 mg Oral QHS  . pantoprazole (PROTONIX) IV  40 mg Intravenous QAC breakfast  . PARoxetine  10 mg Oral Daily  . sodium chloride  10 mL Intracatheter Q12H   Continuous Infusions:    . TPN (CLINIMIX) +/- additives 35 mL/hr at 08/15/11 1101   And  . fat emulsion 250 mL (08/14/11 1828)   PRN Meds:.acetaminophen, acetaminophen, alum & mag hydroxide-simeth, morphine injection, ondansetron (ZOFRAN) IV, ondansetron, sodium chloride  Ht: 4\' 8"  (142.2 cm)  Wt: 142 lb 3.2 oz (64.5 kg)  Ideal Wt: 36.3 kg 40.8 kg % Ideal Wt: 157%  Usual Wt: 120-129# % Usual Wt: 100%  Weight Information (last 22 days)       Date/Time    Weight    BSA (Calculated - sq m)    BMI (Calculated)  Who       08/15/11 0357   142 lb 3.2 oz (64.5 kg)   --   --  JD      08/14/11 0500   135 lb 12.9 oz (61.6 kg)   --   --  TG      08/13/11 0500   146 lb 13.2 oz (66.6 kg)   --   --  PM      08/12/11 0500   138 lb 10.7 oz (62.9 kg)   --   --  PM      08/11/11 0500   138 lb 14.2 oz (63 kg)   --   --  TG      08/09/11 0600   132 lb 0.9 oz (59.9 kg)   --   --  JG      08/08/11 1400   128 lb 4.9 oz (58.2 kg)   --   --  SS      08/06/11 0511   128 lb 12 oz (58.4 kg)   --   --  TN      08/05/11 0357   130 lb 15.3 oz (59.4 kg)   --   --  TN      08/01/11 0729   123 lb 10.9 oz (56.1 kg)   --   --  JB      07/31/11 0557   128 lb 1.4 oz (58.1 kg)   --   --  JG      07/30/11 0610   132 lb 7.9 oz (60.1 kg)   --   --  SW      07/29/11 0615   126 lb 1.7 oz (57.2 kg)   --   --  SW      07/28/11 0044   127 lb 10.3 oz (57.9 kg)   --   --  JB      07/27/11 0530   123 lb 14.4 oz (56.2 kg)   --   --  TGA      07/26/11 2012   123 lb 14.4 oz (56.2  kg)   1.49 sq meters   27.8  TGA      07/26/11 1945   123 lb 14.4 oz (56.2 kg)   1.49 sq meters   27.8  TGA                Medical History       Body mass index is 31.88 kg/(m^2).  Filed Vitals:   08/15/11 0524 08/15/11 2000 08/15/11 2155 08/16/11 0600  BP: 103/59  135/77 114/71  Pulse: 84  85 84  Temp: 98.1 F (36.7 C)  98.4 F (36.9 C) 98.1 F (36.7 C)  TempSrc: Oral  Oral Oral  Resp: 18  18 20   Height:      Weight:      SpO2: 100% 98% 100% 95%    Food/Nutrition  08-16-11 Pt tol Dys 3 diet with thin liquids. TPN (Clinimix 5/15 and 20% Lipids-d/c). Pt finishing lunch meal and tol well. She ate 100% beef, 25% mashed potatoes and 10% applesauce, 100% Ginger Ale and 4 oz cranberry juice. She has had significant severe wt gain since admission 18#, 14% x 20d and 6#,4.4% x 2d. Her wt is monitored daily. She reports to be feeling much better and hopes to be d/c soon for rehab.  BMET    Component Value Date/Time   NA 138 08/16/2011 0738   K 3.8 08/16/2011 0738   CL 104 08/16/2011 0738   CO2 25 08/16/2011 0738   GLUCOSE 103* 08/16/2011 0738   BUN 18 08/16/2011 0738   CREATININE 0.62 08/16/2011 0738   CALCIUM 9.0 08/16/2011 0738   GFRNONAA 79* 08/16/2011 0738   GFRAA >90 08/16/2011 0738   CBC    Component Value Date/Time   WBC 6.4 08/16/2011 0738   RBC 2.68* 08/16/2011 0738   HGB 8.2* 08/16/2011 0738   HCT 24.8* 08/16/2011 0738   PLT 255 08/16/2011 0738   MCV 92.5 08/16/2011 0738   MCH 30.6 08/16/2011 0738   MCHC 33.1 08/16/2011 0738   RDW 14.1 08/16/2011 0738   LYMPHSABS 1.3 08/09/2011 0523   MONOABS 0.8 08/09/2011 0523   EOSABS 0.7 08/09/2011 0523   BASOSABS 0.1 08/09/2011 0523   CBG (last 3)  No results found for this basename: GLUCAP:3 in  the last 72 hours  Intake/Output Summary (Last 24 hours) at 08/16/11 1327 Last data filed at 08/16/11 1610  Gross per 24 hour  Intake   1323 ml  Output      0 ml  Net   1323 ml      Diet Order: Dysphagia 3 with thin liquids  Supplements/Tube Feeding: none at this time  IVF:     TPN [facility]) +/- additives Last Rate: 35 mL/hr at 08/15/11 1101  And   fat emulsion Last Rate: 250 mL (08/14/11 1828)    Estimated Nutritional Needs:   Kcal:1400-1680 Protein:84-95 grams Fluid:1.4-1.7 L/d  NUTRITION DIAGNOSIS: -Inadequate oral intake (NI-2.1).  Status: Ongoing  RELATED TO:  -altered GI function  AS EVIDENCE BY:  -N/V at admission, pt NPO/Clear Liquids >7d  MONITORING/EVALUATION(Goals): -Pt will meet >75% est energy and protein needs. Goal not met. -She will maintain UBW range. Goal not met. -She will tol diet advancement to solid foods without N/V. Goal met. -Monitor po's, labs and wt changes  EDUCATION NEEDS: -Education needs addressed  INTERVENTION: -Add Ensure Clinical Strength BID (350 kcal, 13 gr protein)    Dietitian [phone]  DOCUMENTATION CODES Per approved criteria  -Not Applicable    Carolan Clines, Charlann Noss  08/16/2011, 1:27 PM

## 2011-08-17 LAB — COMPREHENSIVE METABOLIC PANEL
ALT: 9 U/L (ref 0–35)
AST: 27 U/L (ref 0–37)
Albumin: 2 g/dL — ABNORMAL LOW (ref 3.5–5.2)
Calcium: 8.7 mg/dL (ref 8.4–10.5)
GFR calc Af Amer: 86 mL/min — ABNORMAL LOW (ref >90)
Sodium: 138 mEq/L (ref 135–145)
Total Protein: 5.6 g/dL — ABNORMAL LOW (ref 6.0–8.3)

## 2011-08-17 LAB — CBC
MCH: 30.3 pg (ref 26.0–34.0)
MCHC: 32.5 g/dL (ref 30.0–36.0)
Platelets: 272 10*3/uL (ref 150–400)
RDW: 14.2 % (ref 11.5–15.5)

## 2011-08-17 MED ORDER — BIOTENE DRY MOUTH MT LIQD: Freq: Two times a day (BID) | OROMUCOSAL | Status: DC

## 2011-08-17 NOTE — Progress Notes (Signed)
Physical Therapy Evaluation Patient Details Name: [name] MRN: [id number] DOB: [birth year] Today's Date: 08/17/2011  Problem List:  Patient Active Problem List  Diagnoses  . HYPOTENSION  . GERD  . DYSPHAGIA  . Pulmonary embolism    Past Medical History:  Past Medical History  Diagnosis Date  . Hypertension   . GERD (gastroesophageal reflux disease)   . Parkinson disease   . Depression   . Pulmonary embolism    Past Surgical History:  Past Surgical History  Procedure Date  . Esophagogastroduodenoscopy 06/10/2011    Procedure: ESOPHAGOGASTRODUODENOSCOPY (EGD);  Surgeon: Malissa Hippo, MD;  Location: [facility];  Service: Endoscopy;  Laterality: N/A;  . Vena cava filter placement   . Esophagogastroduodenoscopy 07/29/2011    Procedure: ESOPHAGOGASTRODUODENOSCOPY (EGD);  Surgeon: Malissa Hippo, MD;  Location: [facility];  Service: Endoscopy;  Laterality: N/A;  . Laparoscopic nissen fundoplication 08/09/2011    Procedure: LAPAROSCOPIC NISSEN FUNDOPLICATION;  Surgeon: Fabio Bering;  Location: [facility];  Service: General;  Laterality: N/A;  Laparoscopic Hiatal Hernia Repair  . Gastrostomy 08/09/2011    Procedure: GASTROSTOMY;  Surgeon: Fabio Bering;  Location: [facility];  Service: General;  Laterality: Left;  Laparoscopic Gastrostomy Tube placement    PT Assessment/Plan/Recommendation PT Assessment Clinical Impression Statement: pt with prolonged hospital stay due to illness/surgery, continues with decreased strength, ROM, mobility, balance, activity tolerance and would benefit from SNF at d/c PT Recommendation/Assessment: Patient will need skilled PT in the acute care venue PT Problem List: Decreased strength;Decreased range of motion;Decreased activity tolerance;Decreased balance;Decreased mobility;Decreased knowledge of use of DME Barriers to Discharge: None PT Therapy Diagnosis : Difficulty walking;Generalized weakness PT Plan PT Frequency: Min 3X/week PT  Treatment/Interventions: DME instruction;Functional mobility training;Therapeutic exercise;Balance training PT Recommendation Follow Up Recommendations: Skilled nursing facility Equipment Recommended: Defer to next venue PT Goals  Acute Rehab PT Goals PT Goal Formulation: With patient Time For Goal Achievement: 7 days Pt will go Supine/Side to Sit: with mod assist Pt will go Sit to Supine/Side: with mod assist Pt will Transfer Sit to Stand/Stand to Sit: with max assist  PT Evaluation Precautions/Restrictions  Precautions Precautions: Fall Required Braces or Orthoses: No Restrictions Weight Bearing Restrictions: No Prior Functioning  Home Living Lives With: Alone Receives Help From: Family;Personal care attendant 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 Arousal/Alertness: Awake/alert Overall Cognitive Status: Appears within functional limits for tasks assessed Orientation Level: Oriented X4 Sensation/Coordination Sensation Light Touch: Appears Intact Stereognosis: Not tested Hot/Cold: Not tested Proprioception: Not tested Extremity Assessment RUE Assessment RUE Assessment:  (pt strength generally 2+/5) LUE Assessment LUE Assessment:  (strength 2+/5) RLE Assessment RLE Assessment:  (strength 3-/5, equinus deformity of foot) LLE Assessment LLE Assessment: Exceptions to WFL LLE AROM (degrees) Left Hip Extension 0-30: -10  Left Hip ABduction 0-45: 0  Left Knee Extension 0-130: -20  LLE Strength LLE Overall Strength Comments: 2/5 with some increased extensor tone Mobility (including Balance) Bed Mobility Bed Mobility: Yes Rolling Right: 3: Mod assist;With rail Rolling Left: 3: Mod assist;With rail Left Sidelying to Sit: 2: Max assist Supine to Sit: 2: Max assist;HOB elevated (Comment degrees) (60 deg) Sitting - Scoot to Edge of Bed: 1: +1 Total assist Sit to Supine - Left: 2: Max assist Transfers Transfers: Yes Stand to Sit:  (unable now) Stand  Pivot Transfers:  (unable now) Lateral/Scoot Transfers: 2: Max assist Lateral/Scoot Transfer Details (indicate cue type and reason): pt uses UEs to hold therapist with min weight on LEs Ambulation/Gait Ambulation/Gait: No  Stairs: No Wheelchair Mobility Wheelchair Mobility: No  Posture/Postural Control Posture/Postural Control: Postural limitations Postural Limitations: thoracic kyphosis with LLE hyperadducted and flexed at the hip...also has equinus deformity both feet Balance Balance Assessed: Yes Static Sitting Balance Static Sitting - Level of Assistance: 5: Stand by assistance Exercise    End of Session PT - End of Session Equipment Utilized During Treatment: Gait belt Activity Tolerance: Patient tolerated treatment well Patient left: in bed;with call bell in reach;with bed alarm set Nurse Communication: Mobility status for transfers General Behavior During Session: [facility] for tasks performed Cognition: [facility] for tasks performed  Konrad Penta 08/17/2011, 12:44 PM

## 2011-08-17 NOTE — Progress Notes (Signed)
8 Days Post-Op  Subjective: No acute change.  Tolerating PO.  Objective: Vital signs in last 24 hours: Temp:  [97.8 F (36.6 C)-98.5 F (36.9 C)] 98.2 F (36.8 C) (10/23 1300) Pulse Rate:  [83-121] 83  (10/23 1300) Resp:  [18-20] 18  (10/23 1300) BP: (102-134)/(63-76) 102/63 mmHg (10/23 1300) SpO2:  [97 %-100 %] 97 % (10/23 1300) Weight:  [63.4 kg (139 lb 12.4 oz)] 139 lb 12.4 oz (63.4 kg) (10/23 0712) Last BM Date: 08/14/11  Intake/Output from previous day: 10/22 0701 - 10/23 0700 In: 250 [P.O.:240; I.V.:10] Out: -  Intake/Output this shift: Total I/O In: 600 [P.O.:600] Out: -   General appearance: alert and no distress GI: soft, non-tender; bowel sounds normal; no masses,  no organomegaly  Lab Results:  @LABLAST2 (wbc:2,hgb:2,hct:2,plt:2) BMET  Basename 08/17/11 0528 08/16/11 0738  NA 138 138  K 3.9 3.8  CL 104 104  CO2 29 25  GLUCOSE 100* 103*  BUN 14 18  CREATININE 0.76 0.62  CALCIUM 8.7 9.0   PT/INR No results found for this basename: LABPROT:2,INR:2 in the last 72 hours ABG No results found for this basename: PHART:2,PCO2:2,PO2:2,HCO3:2 in the last 72 hours  Studies/Results: No results found.  Anti-infectives: Anti-infectives     Start     Dose/Rate Route Frequency Ordered Stop   08/08/11 2345   ceFAZolin (ANCEF) IVPB 1 g/50 mL premix        1 g 100 mL/hr over 30 Minutes Intravenous 60 min pre-op 08/08/11 2345 08/09/11 0955          Assessment/Plan: s/p Procedure(s): LAPAROSCOPIC NISSEN FUNDOPLICATION GASTROSTOMY Doing well from my standpoint. Ok to d/c to rehab.  Patient can follow-up in 2 wks as out patient.  Available.  LOS: 22 days    [name] C 08/17/2011

## 2011-08-17 NOTE — Progress Notes (Signed)
NAME:  [name], [name]                       ACCOUNT NO.:  MEDICAL RECORD NO.:  LOCATION:                                 FACILITY:  PHYSICIAN:  Kingsley Callander. [name], MD            DATE OF BIRTH:  DATE OF PROCEDURE:  08/16/2011 DATE OF DISCHARGE:                                PROGRESS NOTE   [name] is awake and alert.  This morning she was comfortable and denies any abdominal pain.  She had experienced some nausea yesterday she states.  PHYSICAL EXAMINATION:  VITAL SIGNS:  Stable and were normal this past evening. LUNGS:  Clear. HEART:  Regular with no murmurs. ABDOMEN:  Soft and nontender with no hepatosplenomegaly.  Her gastrostomy site is well healed.  Her laparoscopy sites are healing well.  Her PICC line site in the right arm looks fine.  IMPRESSION/PLAN:  Status post hiatal hernia repair.  She is tolerating full liquids fairly well.  Diet will be advanced per surgery.  The plan will be to taper TPN.  She continues to tolerate this well.  Her electrolytes were certainly acceptable yesterday and her renal function is normal with a BUN of 23 and creatinine 0.66.     Kingsley Callander. [name], MD     ROF/MEDQ  D:  08/16/2011  T:  08/16/2011  Job:  [id number]

## 2011-08-17 NOTE — Progress Notes (Signed)
NAMERODINA, PINALES                 ACCOUNT NO.:  [id number]  MEDICAL RECORD NO.:  [id number]  LOCATION:  A322                          FACILITY:  [facility]  PHYSICIAN:  Kingsley Callander. [name], MD       DATE OF BIRTH:  [birth year]  DATE OF PROCEDURE:  08/17/2011 DATE OF DISCHARGE:                                PROGRESS NOTE   Ms. Gilland has been comfortable.  She has been afebrile.  She has been intermittently confused.  PHYSICAL EXAMINATION:  VITAL SIGNS:  Her oxygen saturation is normal at 97%.  Temperature is 97.8, blood pressure is 134/76, heart rate has ranged from 84-121. LUNGS:  Clear. HEART:  Regular with no murmurs. ABDOMEN:  Nontender.  Wounds are healing well.  Right arm PICC line site reveals no swelling.  IMPRESSION/PLAN: 1. Status post hiatal hernia repair.  She is receiving a dysphagia 3     diet.  She is tolerating it reasonably well.  TPN has been weaned     off.  Her white count is 5.7, hemoglobin is 7.9, platelets 272,000.  She needs to continue to have physical therapy for strengthening.  This has been discussed with her daughter who is interested in possibly having a rehab stay prior to returning home.  Her electrolytes are normal.  Sodium is 138 with potassium of 3.9, BUN and creatinine are 14 and 0.76.  Albumin is 2.0 with a total protein of 5.6, glucose is 100.  1. Parkinson's disease, stable.     Kingsley Callander. [name], MD     ROF/MEDQ  D:  08/17/2011  T:  08/17/2011  Job:  [id number]

## 2011-08-18 LAB — COMPREHENSIVE METABOLIC PANEL
Alkaline Phosphatase: 164 U/L — ABNORMAL HIGH (ref 39–117)
BUN: 11 mg/dL (ref 6–23)
GFR calc Af Amer: >90 mL/min (ref >90)
Glucose, Bld: 107 mg/dL — ABNORMAL HIGH (ref 70–99)
Potassium: 3.1 mEq/L — ABNORMAL LOW (ref 3.5–5.1)
Total Bilirubin: 0.3 mg/dL (ref 0.3–1.2)
Total Protein: 5.8 g/dL — ABNORMAL LOW (ref 6.0–8.3)

## 2011-08-18 LAB — CBC
HCT: 24.2 % — ABNORMAL LOW (ref 36.0–46.0)
Hemoglobin: 7.8 g/dL — ABNORMAL LOW (ref 12.0–15.0)
MCHC: 32.2 g/dL (ref 30.0–36.0)
MCV: 93.8 fL (ref 78.0–100.0)

## 2011-08-18 MED ORDER — BIOTENE DRY MOUTH MT LIQD
Freq: Two times a day (BID) | OROMUCOSAL | Status: DC
  Administered 2011-08-18 (×2): via OROMUCOSAL

## 2011-08-18 MED ORDER — CHLORHEXIDINE GLUCONATE 0.12 % MT SOLN
15.0000 mL | Freq: Two times a day (BID) | OROMUCOSAL | Status: DC
  Administered 2011-08-18 – 2011-08-19 (×3): 15 mL via OROMUCOSAL
  Filled 2011-08-18 (×3): qty 15

## 2011-08-18 MED ORDER — POTASSIUM CHLORIDE 10 MEQ/100ML IV SOLN
10.0000 meq | INTRAVENOUS | Status: AC
  Administered 2011-08-18 (×4): 10 meq via INTRAVENOUS
  Filled 2011-08-18 (×4): qty 100

## 2011-08-18 NOTE — Progress Notes (Signed)
Physical Therapy Treatment Patient Details Name: [name] MRN: [id number] DOB: Jan 06, 1923 Today's Date: 08/18/2011  PT Assessment/Plan  PT - Assessment/Plan Comments on Treatment Session: very cooperative and pleasant with increased mobility in the bed...still a long way from being able to stand and walk PT Plan: Discharge plan remains appropriate PT Goals  Acute Rehab PT Goals PT Goal: Supine/Side to Sit - Progress: Met PT Goal: Sit to Supine/Side - Progress: Met PT Transfer Goal: Sit to Stand/Stand to Sit - Progress: Progressing toward goal PT Transfer Goal: Bed to Chair/Chair to Bed - Progress: Progressing toward goal PT Goal: Ambulate - Progress: Discontinued (comment) (not realistic at this time)  PT Treatment Precautions/Restrictions  Precautions Precautions: Fall Required Braces or Orthoses: No Restrictions Weight Bearing Restrictions: No Mobility (including Balance) Bed Mobility Rolling Right: 4: Min assist;With rail Rolling Left: 4: Min assist;With rail Left Sidelying to Sit: 3: Mod assist Supine to Sit: 3: Mod assist Sitting - Scoot to Edge of Bed: 2: Max assist Sit to Supine - Left: 2: Max assist Transfers Lateral/Scoot Transfers: 2: Max assist Ambulation/Gait Ambulation/Gait: No Wheelchair Mobility Wheelchair Mobility: No  Cabin crew Sitting - Level of Assistance: 6: Modified independent (Device/Increase time) Exercise  General Exercises - Lower Extremity Ankle Circles/Pumps: AROM;Both;10 reps;Supine Quad Sets: AROM;Both;10 reps;Supine Short Arc Quad: AROM;Both;10 reps;Supine Heel Slides: AAROM;Both;10 reps;Supine Hip ABduction/ADduction: AAROM;Both;10 reps;Supine End of Session PT - End of Session Equipment Utilized During Treatment: Gait belt Activity Tolerance: Patient tolerated treatment well Patient left: in bed;with call bell in reach;with bed alarm set General Behavior During Session: [facility] for tasks performed Cognition:  [facility] for tasks performed  Konrad Penta 08/18/2011, 1:51 PM

## 2011-08-18 NOTE — Consult Note (Signed)
CSW presented bed offers to pt's daughter [name]. Pt not alert at this time. She requests to discuss with pt tonight and will notify CSW of decision in morning.  Per MD, possible d/c tomorrow.    Karn Cassis

## 2011-08-19 LAB — COMPREHENSIVE METABOLIC PANEL
ALT: <5 U/L (ref 0–35)
AST: 16 U/L (ref 0–37)
Albumin: 2 g/dL — ABNORMAL LOW (ref 3.5–5.2)
Alkaline Phosphatase: 149 U/L — ABNORMAL HIGH (ref 39–117)
Chloride: 105 mEq/L (ref 96–112)
Potassium: 4 mEq/L (ref 3.5–5.1)
Total Bilirubin: 0.3 mg/dL (ref 0.3–1.2)

## 2011-08-19 LAB — CBC
HCT: 24.5 % — ABNORMAL LOW (ref 36.0–46.0)
Platelets: 319 10*3/uL (ref 150–400)
RDW: 14.2 % (ref 11.5–15.5)
WBC: 5.6 10*3/uL (ref 4.0–10.5)

## 2011-08-19 MED ORDER — SODIUM CHLORIDE 0.9 % IJ SOLN
INTRAMUSCULAR | Status: AC
  Filled 2011-08-19: qty 10

## 2011-08-19 NOTE — Progress Notes (Signed)
Pt d/c today by MD.  Pt and pt's daughter chose for pt to d/c to [facility] via [facility].  Facility aware and agreeable.  No other needs reported.  Karn Cassis

## 2011-08-19 NOTE — Discharge Summary (Signed)
[name], [name]                 ACCOUNT NO.:  [id number]  MEDICAL RECORD NO.:  [id number]  LOCATION:  A322                          FACILITY:  [facility]  PHYSICIAN:  Kingsley Callander. [name], MD       DATE OF BIRTH:  June 12, 1923  DATE OF ADMISSION:  07/26/2011 DATE OF DISCHARGE:  LH                              DISCHARGE SUMMARY   DISCHARGE DIAGNOSES: 1. Diaphragmatic hernia. 2. Recurrent nausea and vomiting. 3. Hypokalemia. 4. Normocytic anemia. 5. Parkinson disease. 6. History of pulmonary embolus.  HOSPITAL COURSE:  This patient is an 77 year old female with Parkinson disease, who presented with recurrent nausea and vomiting.  She was unable to eat.  She was evaluated by Gastroenterology.  She underwent an upper endoscopy, which revealed an extremely large hiatal hernia.  Food material from several days prior was noted to remain in the hernia.  She was assessed from a surgical standpoint by Dr. Leticia Penna.  After much discussion, the decision was made to proceed with repair of her hiatal hernia.  She was treated with TPN through a PICC line in her right arm.  She underwent a Nissen fundoplication and gastrostomy tube placement. Unfortunately, the gastrostomy tube was accidentally pulled out 2 days postop.  It was felt that the bulb was defective and had deflated.  The tract healed without complication.  She underwent a contrast study which revealed no extravasation.  Her diet was advanced.  She tolerated this reasonably well without recurrent vomiting.  She did continue on TPN during this time.  She was treated for hypokalemia with supplemental potassium.  She was treated with physical therapy and had slow progress.  She is unable to stand unassisted and is unable to walk at this point. Arrangements are being made for rehab after hospital discharge.  Her overall condition has improved, although she remains quite weak. She is stable for discharge on the 25th when arrangements for her  rehab stay are made.  This been discussed with care staff.  DISCHARGE MEDICATIONS: 1. Sinemet 25-100 two tablets t.i.d. 2. Amitriptyline 10 mg nightly. 3. Remeron 15 mg nightly. 4. Protonix 40 mg daily. 5. Paxil 10 mg daily.  Her diet is a dysphagia 3 diet.  She has a stable anemia with a hemoglobin of 7.9 at discharge.  Please repeat a CBC and comprehensive metabolic profile in 10 days.  Arrangements for additional physical therapy will be made.     Kingsley Callander. [name], MD     ROF/MEDQ  D:  08/19/2011  T:  08/19/2011  Job:  [id number]

## 2011-08-19 NOTE — Progress Notes (Signed)
[name], [name]                 ACCOUNT NO.:  [id number]  MEDICAL RECORD NO.:  [id number]  LOCATION:  A322                          FACILITY:  [facility]  PHYSICIAN:  Kingsley Callander. [name], MD       DATE OF BIRTH:  11-26-1922  DATE OF PROCEDURE:  08/18/2011 DATE OF DISCHARGE:                                PROGRESS NOTE   Ms. Huckeba was alert and comfortable.  She has been able to tolerate her diet.  She has made slow progress with physical therapy and is not able to stand and walk.  PHYSICAL EXAMINATION:  VITAL SIGNS:  Stable. LUNGS:  Clear. HEART:  Regular with no murmurs. ABDOMEN:  Soft and nontender.  IMPRESSION/PLAN: 1. Status post Nissen fundoplication.  Her course has been discussed     with Dr. Leticia Penna.  Arrangements will be made for rehab at this     point.  Continue physical therapy. 2. Anemia.  Hemoglobin is 7.8, will forego transfusion for now. 3. Hypokalemia.  Serum potassium has dropped from 3.9 to 3.1.  This     will be replaced intravenously. 4. Malnutrition.  Continue TPN for now. 5. Parkinson's disease.  Continue Sinemet.     Kingsley Callander. [name], MD     ROF/MEDQ  D:  08/19/2011  T:  08/19/2011  Job:  [id number]

## 2011-08-19 NOTE — Plan of Care (Signed)
Problem: Inadequate Intake (NI-2.1) Goal: Food and/or nutrient delivery Individualized approach for food/nutrient provision.  Outcome: Adequate for Discharge Pt medically cleared for d/c. She is tol Dys 3 diet with Ensure Clinical Strength BID po's 50% most meals. Rec oral suppl be cont with d/c orders.

## 2011-08-19 NOTE — Progress Notes (Signed)
Patient d/c to [facility] report called to [facility] [employer] by [facility] EMS staff, d/c packet sent with EMS, left floor via stretcher No c/o pain at d/c Family notified of transfer [name], [employer]

## 2011-10-21 ENCOUNTER — Emergency Department (HOSPITAL_COMMUNITY)
Admission: EM | Admit: 2011-10-21 | Discharge: 2011-10-22 | Disposition: A | Discharge status: Home / Self Care | Payer: Medicare Other | Attending: Emergency Medicine | Admitting: Emergency Medicine

## 2011-10-21 ENCOUNTER — Encounter (HOSPITAL_COMMUNITY): Payer: Self-pay

## 2011-10-21 DIAGNOSIS — G20A1 Parkinson's disease without dyskinesia, without mention of fluctuations: Secondary | ICD-10-CM | POA: Insufficient documentation

## 2011-10-21 DIAGNOSIS — S0990XA Unspecified injury of head, initial encounter: Secondary | ICD-10-CM

## 2011-10-21 DIAGNOSIS — S0100XA Unspecified open wound of scalp, initial encounter: Secondary | ICD-10-CM | POA: Insufficient documentation

## 2011-10-21 DIAGNOSIS — Y9210 Unspecified residential institution as the place of occurrence of the external cause: Secondary | ICD-10-CM | POA: Insufficient documentation

## 2011-10-21 DIAGNOSIS — G20 Parkinson's disease: Secondary | ICD-10-CM | POA: Insufficient documentation

## 2011-10-21 DIAGNOSIS — S0101XA Laceration without foreign body of scalp, initial encounter: Secondary | ICD-10-CM

## 2011-10-21 DIAGNOSIS — W19XXXA Unspecified fall, initial encounter: Secondary | ICD-10-CM | POA: Insufficient documentation

## 2011-10-21 DIAGNOSIS — Z79899 Other long term (current) drug therapy: Secondary | ICD-10-CM | POA: Insufficient documentation

## 2011-10-21 MED ORDER — LIDOCAINE HCL (PF) 1 % IJ SOLN
5.0000 mL | Freq: Once | INTRAMUSCULAR | Status: DC
  Filled 2011-10-21: qty 5

## 2011-10-21 NOTE — ED Notes (Signed)
PER EMS PATIENT STANDING IN THE DOORWAY AND FELL BACKWARDS, WAS NOT WITNESSED. NO LOSS OF CONSCIOUSNESS. SMALL LACERATION TO THE BACK OF HER HEAD. C/O BACK PAIN. PATIENT STATES SHE THOUGHT SHE COULD GET TO THE BATHROOM AND SHE FELL. PT. STATES THAT SHE WAS FEELING DIZZY.

## 2011-10-22 ENCOUNTER — Emergency Department (HOSPITAL_COMMUNITY): Payer: Medicare Other

## 2011-10-22 NOTE — ED Provider Notes (Signed)
History     CSN: [id number]  Arrival date & time 10/21/11  2132   First MD Initiated Contact with Patient 10/22/11 [phone]      Chief Complaint  Patient presents with  . Fall    (Consider location/radiation/quality/duration/timing/severity/associated sxs/prior treatment) HPI Level 5 Caveat: cognitive impairment due to Parkinson's disease and sundowning. This is an 77 year old white female with history of Parkinson's disease. She was brought from her nursing home after non-witnessed fall. The patient told staff that she thought that she get to the bathroom and that she fell backwards, hitting her head. She denied loss of consciousness. EMS reports a small laceration to the back of her head. There's been no emesis.   Past Medical History  Diagnosis Date  . Hypertension   . GERD (gastroesophageal reflux disease)   . Parkinson disease   . Depression   . Pulmonary embolism     Past Surgical History  Procedure Date  . Esophagogastroduodenoscopy 06/10/2011    Procedure: ESOPHAGOGASTRODUODENOSCOPY (EGD);  Surgeon: Malissa Hippo, MD;  Location: [facility];  Service: Endoscopy;  Laterality: N/A;  . Vena cava filter placement   . Esophagogastroduodenoscopy 07/29/2011    Procedure: ESOPHAGOGASTRODUODENOSCOPY (EGD);  Surgeon: Malissa Hippo, MD;  Location: [facility];  Service: Endoscopy;  Laterality: N/A;  . Laparoscopic nissen fundoplication 08/09/2011    Procedure: LAPAROSCOPIC NISSEN FUNDOPLICATION;  Surgeon: Fabio Bering;  Location: [facility];  Service: General;  Laterality: N/A;  Laparoscopic Hiatal Hernia Repair  . Gastrostomy 08/09/2011    Procedure: GASTROSTOMY;  Surgeon: Fabio Bering;  Location: [facility];  Service: General;  Laterality: Left;  Laparoscopic Gastrostomy Tube placement    No family history on file.  History  Substance Use Topics  . Smoking status: Never Smoker   . Smokeless tobacco: Not on file  . Alcohol Use: No    OB History    Grav Para Term  Preterm Abortions TAB SAB Ect Mult Living                  Review of Systems  Unable to perform ROS   Allergies  Metoclopramide hcl  Home Medications   Current Outpatient Rx  Name Route Sig Dispense Refill  . ACETAMINOPHEN 325 MG PO TABS Oral Take 650 mg by mouth every 4 (four) hours as needed. For mild pain     . AMITRIPTYLINE HCL 10 MG PO TABS Oral Take 10 mg by mouth at bedtime.      Marland Kitchen CARBIDOPA-LEVODOPA 25-100 MG PO TABS Oral Take 2 tablets by mouth 3 (three) times daily.      Marland Kitchen MIRTAZAPINE 15 MG PO TABS Oral Take 15 mg by mouth at bedtime.      . OMEPRAZOLE 20 MG PO TBEC Oral Take 1 tablet by mouth every morning.     Marland Kitchen PAROXETINE HCL 10 MG PO TABS Oral Take 10 mg by mouth every morning.      Marland Kitchen ZINC OXIDE 10 % EX CREA Apply externally Apply 1 application topically daily. After every shift and for incontinence care       BP 122/74  Pulse 82  Temp(Src) 98.5 F (36.9 C) (Oral)  Resp 18  Ht 4\' 11"  (1.499 m)  Wt 130 lb (58.968 kg)  BMI 26.26 kg/m2  SpO2 96%  Physical Exam General: Well-developed, well-nourished female in no acute distress; appearance consistent with age of record HENT: normocephalic, small hematoma and laceration to left occiput Eyes: pupils equal round and reactive to  light; extraocular muscles intact Neck: decreased range of motion due to chronic changes; no bony tenderness Heart: regular rate and rhythm; distant sounds Lungs: clear to auscultation bilaterally; distant sounds Back: kyphosis Abdomen: soft; nontender; nondistended Extremities: contractures of lower extremities; no bony deformity; muscular atrophy Neurologic: Awake; confused Skin: Warm and dry    ED Course  Procedures (including critical care time) LACERATION REPAIR Performed by: Senna Lape L Authorized by: Hanley Seamen Consent: Verbal consent obtained. Risks and benefits: risks, benefits and alternatives were discussed Consent given by: patient Patient identity confirmed:  provided demographic data Prepped and Draped in normal sterile fashion Wound explored  Laceration Location: left occiput  Laceration Length: 1cm  No Foreign Bodies seen or palpated  Anesthesia: none  Irrigation method: sterile saline soaked gauze Amount of cleaning: standard  Skin closure: a single surgical staple  Patient tolerance: Patient tolerated the procedure well with no immediate complications.    MDM   Nursing notes and vitals signs, including pulse oximetry, reviewed.  Summary of this visit's results, reviewed by myself:   Imaging Studies: Ct Head Wo Contrast  10/22/2011  *RADIOLOGY REPORT*  Clinical Data: Status post fall, with laceration to the back of the head.  CT HEAD WITHOUT CONTRAST  Technique:  Contiguous axial images were obtained from the base of the skull through the vertex without contrast.  Comparison: CT of the head performed 01/13/2011  Findings: There is no evidence of acute infarction, mass lesion, or intra- or extra-axial hemorrhage on CT.  Evaluation is mildly suboptimal due to motion artifact.  Prominence of the ventricles and sulci reflects mild to moderate cortical volume loss.  Scattered periventricular and subcortical white matter change likely reflects small vessel ischemic microangiopathy.  The posterior fossa, including the cerebellum, brainstem and fourth ventricle, is within normal limits.  The third basal ganglia are unremarkable in appearance.  The cerebral hemispheres demonstrate grossly normal gray-white differentiation.  No mass effect or midline shift is seen.  There is no evidence of fracture; visualized osseous structures are unremarkable in appearance.  The orbits are within normal limits. The paranasal sinuses and mastoid air cells are well-aerated. Known soft tissue laceration is not well characterized.  IMPRESSION:  1.  No evidence of traumatic intracranial injury or fracture. 2.  Mild to moderate cortical volume loss and scattered  small vessel ischemic microangiopathy.  Original Report Authenticated By: Tonia Ghent, M.D.            Hanley Seamen, MD 10/22/11 [phone]

## 2011-11-14 IMAGING — CR DG ABDOMEN 1V
1 series · 1 of 1 positions shown · non-contrast
Comparison: 07/26/2011.

CLINICAL DATA: Nasogastric tube placement.

ABDOMEN - 1 VIEW

[view not recorded]
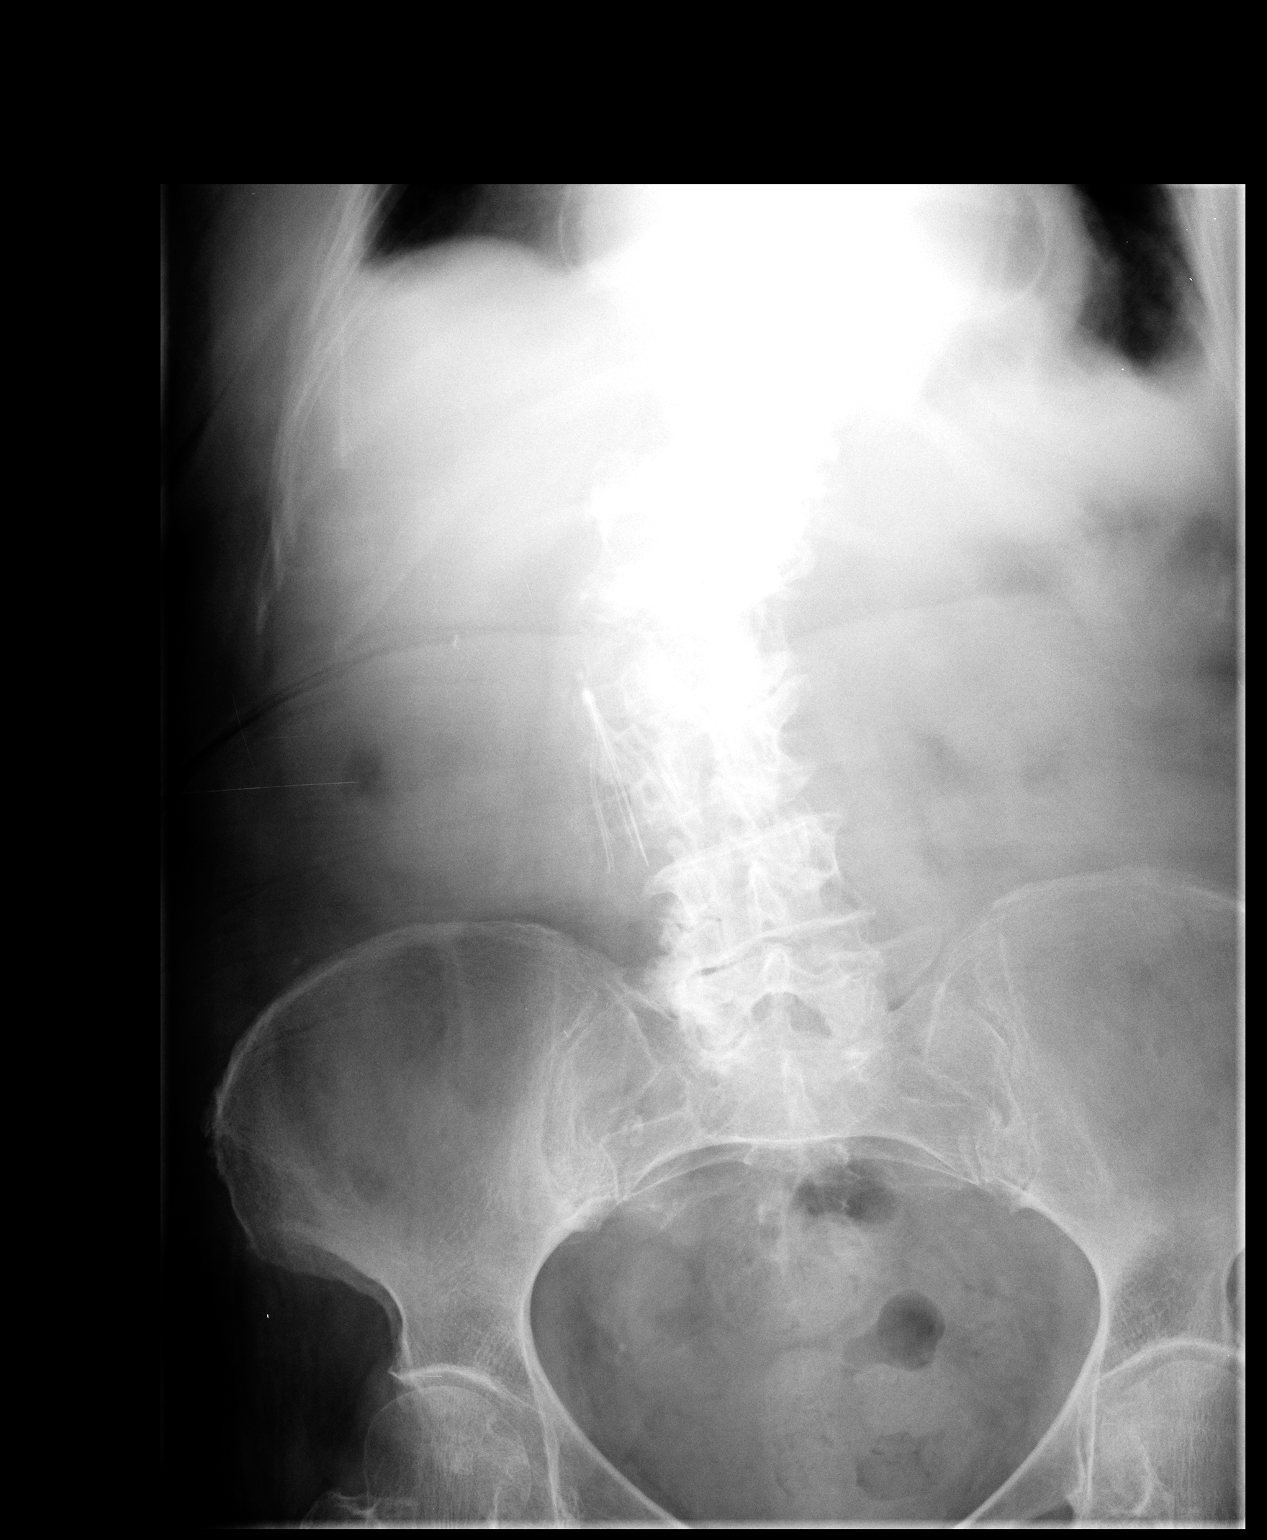

[1 of 1 positions shown; findings below may reference images not displayed]

FINDINGS: Nasogastric tube is present, with redundant loops and a
large hiatal hernia.  The tube does not extend into the abdomen
proper.  IVC filter is present.  Thoracolumbar scoliosis and
spondylosis.  Bilateral pleural effusions are present.
IMPRESSION: Nasogastric tube coiled within hiatal hernia.  The nasogastric tube
does not enter the abdominal portion of the stomach.

## 2011-11-28 ENCOUNTER — Emergency Department (HOSPITAL_COMMUNITY)
Admission: EM | Admit: 2011-11-28 | Discharge: 2011-11-28 | Disposition: A | Discharge status: Home / Self Care | Payer: Medicare Other | Attending: Emergency Medicine | Admitting: Emergency Medicine

## 2011-11-28 ENCOUNTER — Encounter (HOSPITAL_COMMUNITY): Payer: Self-pay | Admitting: *Deleted

## 2011-11-28 DIAGNOSIS — G030 Nonpyogenic meningitis: Secondary | ICD-10-CM | POA: Insufficient documentation

## 2011-11-28 DIAGNOSIS — N390 Urinary tract infection, site not specified: Secondary | ICD-10-CM | POA: Insufficient documentation

## 2011-11-28 DIAGNOSIS — E119 Type 2 diabetes mellitus without complications: Secondary | ICD-10-CM | POA: Insufficient documentation

## 2011-11-28 DIAGNOSIS — Z79899 Other long term (current) drug therapy: Secondary | ICD-10-CM | POA: Insufficient documentation

## 2011-11-28 DIAGNOSIS — R4182 Altered mental status, unspecified: Secondary | ICD-10-CM | POA: Insufficient documentation

## 2011-11-28 DIAGNOSIS — I10 Essential (primary) hypertension: Secondary | ICD-10-CM | POA: Insufficient documentation

## 2011-11-28 DIAGNOSIS — R5381 Other malaise: Secondary | ICD-10-CM | POA: Insufficient documentation

## 2011-11-28 DIAGNOSIS — Z86711 Personal history of pulmonary embolism: Secondary | ICD-10-CM | POA: Insufficient documentation

## 2011-11-28 DIAGNOSIS — M129 Arthropathy, unspecified: Secondary | ICD-10-CM | POA: Insufficient documentation

## 2011-11-28 DIAGNOSIS — R4789 Other speech disturbances: Secondary | ICD-10-CM | POA: Insufficient documentation

## 2011-11-28 DIAGNOSIS — K219 Gastro-esophageal reflux disease without esophagitis: Secondary | ICD-10-CM | POA: Insufficient documentation

## 2011-11-28 HISTORY — DX: Hypercalcemia: E83.52

## 2011-11-28 HISTORY — DX: Unspecified osteoarthritis, unspecified site: M19.90

## 2011-11-28 LAB — URINALYSIS, ROUTINE W REFLEX MICROSCOPIC
Ketones, ur: NEGATIVE mg/dL
Protein, ur: 100 mg/dL — AB
Urobilinogen, UA: 0.2 mg/dL (ref 0.0–1.0)

## 2011-11-28 LAB — BASIC METABOLIC PANEL
Calcium: 9.8 mg/dL (ref 8.4–10.5)
Creatinine, Ser: 1.11 mg/dL — ABNORMAL HIGH (ref 0.50–1.10)
GFR calc non Af Amer: 43 mL/min — ABNORMAL LOW (ref >90)
Glucose, Bld: 101 mg/dL — ABNORMAL HIGH (ref 70–99)
Sodium: 135 mEq/L (ref 135–145)

## 2011-11-28 LAB — DIFFERENTIAL
Basophils Absolute: 0 10*3/uL (ref 0.0–0.1)
Eosinophils Absolute: 0.1 10*3/uL (ref 0.0–0.7)
Eosinophils Relative: 2 % (ref 0–5)

## 2011-11-28 LAB — CBC
MCH: 30.2 pg (ref 26.0–34.0)
MCV: 92.6 fL (ref 78.0–100.0)
Platelets: 237 10*3/uL (ref 150–400)
RDW: 15.8 % — ABNORMAL HIGH (ref 11.5–15.5)

## 2011-11-28 LAB — URINE MICROSCOPIC-ADD ON

## 2011-11-28 MED ORDER — CEPHALEXIN 500 MG PO CAPS: 500.0000 mg | ORAL_CAPSULE | Freq: Three times a day (TID) | ORAL | Status: AC

## 2011-11-28 MED ORDER — DEXTROSE 5 % IV SOLN
1.0000 g | Freq: Once | INTRAVENOUS | Status: AC
  Administered 2011-11-28: 1 g via INTRAVENOUS
  Filled 2011-11-28: qty 10

## 2011-11-28 MED ORDER — SODIUM CHLORIDE 0.9 % IV BOLUS (SEPSIS)
500.0000 mL | Freq: Once | INTRAVENOUS | Status: AC
  Administered 2011-11-28: 500 mL via INTRAVENOUS

## 2011-11-28 NOTE — ED Provider Notes (Signed)
History     CSN: [id number]  Arrival date & time 11/28/11  1502   First MD Initiated Contact with Patient 11/28/11 1549      Chief Complaint  Patient presents with  . Altered Mental Status    (Consider location/radiation/quality/duration/timing/severity/associated sxs/prior treatment) Patient is a 78 y.o. female presenting with altered mental status. The history is provided by the patient and a relative. No language interpreter was used.  Altered Mental Status This is a recurrent problem. The current episode started today. The problem occurs intermittently. The problem has been gradually worsening. Associated symptoms include fatigue and urinary symptoms.  Altered Mental Status This is a recurrent problem. The current episode started today. The problem occurs intermittently. The problem has been gradually worsening.  Patient in ED for evaluation of altered mental status.  Family reports patient was having some slurred speech and asking some non-sensical questions today.  Patient has had similar symptoms with previous UTI's.  Patient awake and appropriately responsive at present.  Follows simple commands.  Past Medical History  Diagnosis Date  . Hypertension   . GERD (gastroesophageal reflux disease)   . Parkinson disease   . Depression   . Pulmonary embolism   . Diabetes mellitus   . Arthritis   . Hypercalcemia     Past Surgical History  Procedure Date  . Esophagogastroduodenoscopy 06/10/2011    Procedure: ESOPHAGOGASTRODUODENOSCOPY (EGD);  Surgeon: Malissa Hippo, MD;  Location: [facility];  Service: Endoscopy;  Laterality: N/A;  . Vena cava filter placement   . Esophagogastroduodenoscopy 07/29/2011    Procedure: ESOPHAGOGASTRODUODENOSCOPY (EGD);  Surgeon: Malissa Hippo, MD;  Location: [facility];  Service: Endoscopy;  Laterality: N/A;  . Laparoscopic nissen fundoplication 08/09/2011    Procedure: LAPAROSCOPIC NISSEN FUNDOPLICATION;  Surgeon: Fabio Bering;   Location: [facility];  Service: General;  Laterality: N/A;  Laparoscopic Hiatal Hernia Repair  . Gastrostomy 08/09/2011    Procedure: GASTROSTOMY;  Surgeon: Fabio Bering;  Location: [facility];  Service: General;  Laterality: Left;  Laparoscopic Gastrostomy Tube placement    History reviewed. No pertinent family history.  History  Substance Use Topics  . Smoking status: Never Smoker   . Smokeless tobacco: Not on file  . Alcohol Use: No    OB History    Grav Para Term Preterm Abortions TAB SAB Ect Mult Living                  Review of Systems  Constitutional: Positive for fatigue.  Genitourinary: Positive for urgency, frequency and difficulty urinating.  Neurological: Positive for speech difficulty and light-headedness.  Psychiatric/Behavioral: Positive for altered mental status.  All other systems reviewed and are negative.    Allergies  Metoclopramide hcl  Home Medications   Current Outpatient Rx  Name Route Sig Dispense Refill  . ACETAMINOPHEN 325 MG PO TABS Oral Take 650 mg by mouth every 4 (four) hours as needed. For mild pain     . AMITRIPTYLINE HCL 10 MG PO TABS Oral Take 10 mg by mouth at bedtime.      Marland Kitchen CARBIDOPA-LEVODOPA 25-100 MG PO TABS Oral Take 2 tablets by mouth 3 (three) times daily.      Marland Kitchen MIRTAZAPINE 15 MG PO TABS Oral Take 15 mg by mouth at bedtime.      . OMEPRAZOLE 20 MG PO TBEC Oral Take 1 tablet by mouth every morning.     Marland Kitchen PAROXETINE HCL 10 MG PO TABS Oral Take 10 mg by mouth  every morning.      Marland Kitchen ZINC OXIDE 10 % EX CREA Apply externally Apply 1 application topically daily. After every shift and for incontinence care       BP 99/60  Pulse 84  Temp(Src) 97.6 F (36.4 C) (Oral)  Resp 18  Ht 4\' 11"  (1.499 m)  Wt 130 lb (58.968 kg)  BMI 26.26 kg/m2  SpO2 99%  Physical Exam  HENT:  Head: Normocephalic and atraumatic.  Eyes: Pupils are equal, round, and reactive to light.  Neck: Normal range of motion. Neck supple.  Cardiovascular: Normal  rate, regular rhythm and normal heart sounds.   Pulmonary/Chest: Effort normal and breath sounds normal.  Abdominal: Soft. Bowel sounds are normal.  Musculoskeletal: She exhibits tenderness.  Neurological: She is alert.  Skin: Skin is warm and dry.  Psychiatric: She has a normal mood and affect.    ED Course  Procedures (including critical care time)   Labs Reviewed  CBC  DIFFERENTIAL  BASIC METABOLIC PANEL  URINALYSIS, WITH MICROSCOPIC   No results found.   No diagnosis found.  Lab results indicate urinary tract infection.  Received dose of rocephin in ED, along with fluid bolus.  Will continue with keflex at home (urine culture pending).  7:41 PM Patient feeling better after IV fluids and medications.  Patient to be discharged home with follow-up by her PCP. MDM          Jimmye Norman, NP 11/28/11 2147  Patient with ALOC from home , who is alert here. She reports mild headache and mild nausea. Cor: RRR, Chest:: clear: Abd: soft, non tender, Flank: No cva tenderness. Labs with + UTI. Agree with initiation of antibiotic treatment. Medical screening examination/treatment/procedure(s) were conducted as a shared visit with non-physician practitioner(s) and myself.  I personally evaluated the patient during the encounter  Nicoletta Dress. Colon Branch, MD 12/02/11 [phone]

## 2011-11-28 NOTE — ED Notes (Signed)
Received report agree with previous assessment; 500 cc bolus of NS completed

## 2011-11-28 NOTE — ED Notes (Signed)
Pt stable at discharge Caregivers verbalize understanding of care. Transported home by ambulance.

## 2011-11-28 NOTE — ED Notes (Signed)
Pt arrived from home via ems d/t altered mental status. pts care giver reported to ems that pt had weakness and slurred speech. ems reports no slurred speech noted on arrival. Pt is alert and answering questions.

## 2011-12-01 LAB — URINE CULTURE
Colony Count: >=100000
Culture  Setup Time: 201302032315

## 2011-12-02 NOTE — ED Notes (Signed)
+   Urine Patient treated with Keflex-sensitive to same-chart appended per protocol MD. 

## 2012-09-12 ENCOUNTER — Other Ambulatory Visit: Payer: Self-pay | Admitting: Urology

## 2012-09-12 ENCOUNTER — Ambulatory Visit (INDEPENDENT_AMBULATORY_CARE_PROVIDER_SITE_OTHER): Payer: Medicare Other | Admitting: Urology

## 2012-09-12 DIAGNOSIS — N390 Urinary tract infection, site not specified: Secondary | ICD-10-CM

## 2012-09-12 DIAGNOSIS — N952 Postmenopausal atrophic vaginitis: Secondary | ICD-10-CM

## 2012-09-15 ENCOUNTER — Ambulatory Visit (HOSPITAL_COMMUNITY)
Admission: RE | Admit: 2012-09-15 | Discharge: 2012-09-15 | Disposition: A | Discharge status: Home / Self Care | Payer: Medicare Other | Source: Ambulatory Visit | Attending: Urology | Admitting: Urology

## 2012-09-15 DIAGNOSIS — N390 Urinary tract infection, site not specified: Secondary | ICD-10-CM | POA: Insufficient documentation

## 2013-01-09 ENCOUNTER — Ambulatory Visit: Payer: Medicare Other | Admitting: Urology

## 2013-01-11 ENCOUNTER — Emergency Department (HOSPITAL_COMMUNITY): Payer: Medicare Other

## 2013-01-11 ENCOUNTER — Encounter (HOSPITAL_COMMUNITY): Payer: Self-pay | Admitting: *Deleted

## 2013-01-11 ENCOUNTER — Emergency Department (HOSPITAL_COMMUNITY)
Admission: EM | Admit: 2013-01-11 | Discharge: 2013-01-11 | Disposition: A | Discharge status: Home / Self Care | Payer: Medicare Other | Attending: Emergency Medicine | Admitting: Emergency Medicine

## 2013-01-11 DIAGNOSIS — G20 Parkinson's disease: Secondary | ICD-10-CM | POA: Insufficient documentation

## 2013-01-11 DIAGNOSIS — Z86711 Personal history of pulmonary embolism: Secondary | ICD-10-CM | POA: Insufficient documentation

## 2013-01-11 DIAGNOSIS — Z87440 Personal history of urinary (tract) infections: Secondary | ICD-10-CM | POA: Insufficient documentation

## 2013-01-11 DIAGNOSIS — R5381 Other malaise: Secondary | ICD-10-CM | POA: Insufficient documentation

## 2013-01-11 DIAGNOSIS — Z8639 Personal history of other endocrine, nutritional and metabolic disease: Secondary | ICD-10-CM | POA: Insufficient documentation

## 2013-01-11 DIAGNOSIS — Z862 Personal history of diseases of the blood and blood-forming organs and certain disorders involving the immune mechanism: Secondary | ICD-10-CM | POA: Insufficient documentation

## 2013-01-11 DIAGNOSIS — M129 Arthropathy, unspecified: Secondary | ICD-10-CM | POA: Insufficient documentation

## 2013-01-11 DIAGNOSIS — K219 Gastro-esophageal reflux disease without esophagitis: Secondary | ICD-10-CM | POA: Insufficient documentation

## 2013-01-11 DIAGNOSIS — Z79899 Other long term (current) drug therapy: Secondary | ICD-10-CM | POA: Insufficient documentation

## 2013-01-11 DIAGNOSIS — E119 Type 2 diabetes mellitus without complications: Secondary | ICD-10-CM | POA: Insufficient documentation

## 2013-01-11 DIAGNOSIS — R5383 Other fatigue: Secondary | ICD-10-CM

## 2013-01-11 DIAGNOSIS — I10 Essential (primary) hypertension: Secondary | ICD-10-CM | POA: Insufficient documentation

## 2013-01-11 DIAGNOSIS — N390 Urinary tract infection, site not specified: Secondary | ICD-10-CM | POA: Insufficient documentation

## 2013-01-11 DIAGNOSIS — G20A1 Parkinson's disease without dyskinesia, without mention of fluctuations: Secondary | ICD-10-CM | POA: Insufficient documentation

## 2013-01-11 DIAGNOSIS — Z8659 Personal history of other mental and behavioral disorders: Secondary | ICD-10-CM | POA: Insufficient documentation

## 2013-01-11 LAB — CBC WITH DIFFERENTIAL/PLATELET
Eosinophils Absolute: 0.1 10*3/uL (ref 0.0–0.7)
Eosinophils Relative: 3 % (ref 0–5)
Hemoglobin: 11.9 g/dL — ABNORMAL LOW (ref 12.0–15.0)
Lymphs Abs: 1.1 10*3/uL (ref 0.7–4.0)
MCH: 32.9 pg (ref 26.0–34.0)
MCV: 97.2 fL (ref 78.0–100.0)
Monocytes Relative: 10 % (ref 3–12)
Neutrophils Relative %: 62 % (ref 43–77)
RBC: 3.62 MIL/uL — ABNORMAL LOW (ref 3.87–5.11)

## 2013-01-11 LAB — COMPREHENSIVE METABOLIC PANEL
AST: 17 U/L (ref 0–37)
Albumin: 3.8 g/dL (ref 3.5–5.2)
Calcium: 9.9 mg/dL (ref 8.4–10.5)
Creatinine, Ser: 1.33 mg/dL — ABNORMAL HIGH (ref 0.50–1.10)

## 2013-01-11 LAB — URINALYSIS, ROUTINE W REFLEX MICROSCOPIC
Ketones, ur: NEGATIVE mg/dL
Nitrite: POSITIVE — AB
Protein, ur: NEGATIVE mg/dL

## 2013-01-11 LAB — URINE MICROSCOPIC-ADD ON

## 2013-01-11 MED ORDER — SODIUM CHLORIDE 0.9 % IV BOLUS (SEPSIS)
500.0000 mL | Freq: Once | INTRAVENOUS | Status: AC
  Administered 2013-01-11: 500 mL via INTRAVENOUS

## 2013-01-11 MED ORDER — SODIUM CHLORIDE 0.9 % IV SOLN: INTRAVENOUS | Status: DC

## 2013-01-11 MED ORDER — DEXTROSE 5 % IV SOLN
1.0000 g | Freq: Once | INTRAVENOUS | Status: AC
  Administered 2013-01-11: 1 g via INTRAVENOUS
  Filled 2013-01-11: qty 10

## 2013-01-11 MED ORDER — CEPHALEXIN 500 MG PO CAPS: 500.0000 mg | ORAL_CAPSULE | Freq: Three times a day (TID) | ORAL | Status: DC

## 2013-01-11 NOTE — ED Notes (Signed)
Currently being treated for UTI. C/o generalized weakness. Pt is from home.

## 2013-01-11 NOTE — ED Provider Notes (Addendum)
History     This chart was scribed for Amy Jakes, MD, MD by [name], ED Scribe. The patient was seen in room APA14/APA14 and the patient's care was started at 8:52 AM.   CSN: [id number]  Arrival date & time 01/11/13  6213       Chief Complaint  Patient presents with  . Fatigue     The history is provided by a relative and a caregiver. No language interpreter was used.  Pt is level 5 caveat due to Parkinson disease. [name] is a 80 y.o. female with hx of Parkinson disease, HTN, DM and PE who presents to the Emergency Department BIB family and nursing aid due to constant, moderate generalized weakness and fatigue today at 6AM. Nursing aid states when she arrived pt was not as responsive as usual. Nurse reports that yesterday pt seemed normal. Nursing aid reports denies fever, chills, nausea, vomiting, diarrhea, weakness, cough, SOB and any other pain. Pt was recently treated for UTI and currently taking trimethoprim 100 mg/ daily at night and finished Cipro (last had 7 days ago).  Family reports that pt has had decrease in mobility after neurologist changed her medication but they state pt has stopped taking the medication.   Urologist Dr. Steele [name]  Dr. Terrace [name] is neurologist    Past Medical History  Diagnosis Date  . Hypertension   . GERD (gastroesophageal reflux disease)   . Parkinson disease   . Depression   . Pulmonary embolism   . Diabetes mellitus   . Arthritis   . Hypercalcemia     Past Surgical History  Procedure Laterality Date  . Esophagogastroduodenoscopy  06/10/2011    Procedure: ESOPHAGOGASTRODUODENOSCOPY (EGD);  Surgeon: Amy Hippo, MD;  Location: [facility];  Service: Endoscopy;  Laterality: N/A;  . Vena cava filter placement    . Esophagogastroduodenoscopy  07/29/2011    Procedure: ESOPHAGOGASTRODUODENOSCOPY (EGD);  Surgeon: Amy Hippo, MD;  Location: [facility];  Service: Endoscopy;  Laterality: N/A;  . Laparoscopic nissen  fundoplication  08/09/2011    Procedure: LAPAROSCOPIC NISSEN FUNDOPLICATION;  Surgeon: [name];  Location: [facility];  Service: General;  Laterality: N/A;  Laparoscopic Hiatal Hernia Repair  . Gastrostomy  08/09/2011    Procedure: GASTROSTOMY;  Surgeon: [name];  Location: [facility];  Service: General;  Laterality: Left;  Laparoscopic Gastrostomy Tube placement    No family history on file.  History  Substance Use Topics  . Smoking status: Never Smoker   . Smokeless tobacco: Not on file  . Alcohol Use: No    OB History   Grav Para Term Preterm Abortions TAB SAB Ect Mult Living                  Review of Systems  Unable to perform ROS   Allergies  Metoclopramide hcl  Home Medications   Current Outpatient Rx  Name  Route  Sig  Dispense  Refill  . acetaminophen (TYLENOL) 325 MG tablet   Oral   Take 650 mg by mouth 2 (two) times daily. Also given as needed for mild pain in addition to twice daily         . amitriptyline (ELAVIL) 10 MG tablet   Oral   Take 10 mg by mouth at bedtime.           . AZILECT 1 MG TABS   Oral   Take 1 mg by mouth every morning.         Marland Kitchen  carbidopa-levodopa (SINEMET) 25-100 MG per tablet   Oral   Take 2 tablets by mouth 3 (three) times daily.           . mirtazapine (REMERON) 15 MG tablet   Oral   Take 15 mg by mouth at bedtime.           . Omeprazole 20 MG TBEC   Oral   Take 1 tablet by mouth every morning.          . traMADol (ULTRAM) 50 MG tablet   Oral   Take 50 mg by mouth daily as needed for pain.         Marland Kitchen trimethoprim (TRIMPEX) 100 MG tablet   Oral   Take 100 mg by mouth at bedtime.         . cephALEXin (KEFLEX) 500 MG capsule   Oral   Take 1 capsule (500 mg total) by mouth 3 (three) times daily.   28 capsule   1     BP 172/82  Pulse 88  Temp(Src) 98 F (36.7 C) (Oral)  Resp 16  Ht 4\' 11"  (1.499 m)  Wt 126 lb (57.153 kg)  BMI 25.44 kg/m2  SpO2 98%  Physical Exam  Nursing note and  vitals reviewed. Constitutional: She is oriented to person, place, and time. She appears well-developed and well-nourished. No distress.  HENT:  Head: Normocephalic and atraumatic.  Eyes: EOM are normal. Pupils are equal, round, and reactive to light.  Neck: Normal range of motion. Neck supple. No tracheal deviation present.  Cardiovascular: Normal rate, regular rhythm and normal heart sounds.   Pulmonary/Chest: Effort normal and breath sounds normal. No respiratory distress. She has no wheezes.  Abdominal: Soft. Bowel sounds are normal. She exhibits no distension. There is no tenderness. There is no rebound and no guarding.  Musculoskeletal: She exhibits no edema.  Lymphadenopathy:    She has no cervical adenopathy.  Neurological: She is alert and oriented to person, place, and time.  Skin: Skin is warm and dry.  Psychiatric: She has a normal mood and affect. Her behavior is normal.    ED Course  Procedures (including critical care time) DIAGNOSTIC STUDIES: Oxygen Saturation is 98% on room air, normal by my interpretation.    COORDINATION OF CARE: 9:02 AM Discussed ED treatment with pt's family and family agrees.  9:50 AM Ordered:  Medications  0.9 %  sodium chloride infusion ( Intravenous Rate/Dose Change 01/11/13 1141)  sodium chloride 0.9 % bolus 500 mL (0 mLs Intravenous Stopped 01/11/13 1141)  cefTRIAXone (ROCEPHIN) 1 g in dextrose 5 % 50 mL IVPB (1 g Intravenous New Bag/Given 01/11/13 1346)    10:50 AM Recheck: Discussed chest xray and plan on having CT chest with family. Pt is sleeping.    Labs Reviewed  URINALYSIS, ROUTINE W REFLEX MICROSCOPIC - Abnormal; Notable for the following:    Specific Gravity, Urine <1.005 (*)    Hgb urine dipstick LARGE (*)    Nitrite POSITIVE (*)    Leukocytes, UA LARGE (*)    All other components within normal limits  COMPREHENSIVE METABOLIC PANEL - Abnormal; Notable for the following:    Glucose, Bld 105 (*)    Creatinine, Ser 1.33 (*)     Total Bilirubin 0.2 (*)    GFR calc non Af Amer 35 (*)    GFR calc Af Amer 40 (*)    All other components within normal limits  CBC WITH DIFFERENTIAL - Abnormal; Notable for the following:  RBC 3.62 (*)    Hemoglobin 11.9 (*)    HCT 35.2 (*)    All other components within normal limits  URINE MICROSCOPIC-ADD ON - Abnormal; Notable for the following:    Bacteria, UA MANY (*)    All other components within normal limits  URINE CULTURE  TROPONIN I   Dg Chest 1 View  01/11/2013  *RADIOLOGY REPORT*  Clinical Data: Weakness.  Diabetic hypertensive patient.  CHEST - 1 VIEW  Comparison: 07/30/2011.  Findings: Cardiomegaly.  Tortuous aorta.  Central pulmonary vascular prominence.  No infiltrate, congestive heart failure or pneumothorax.  Prominence of the right hilum may be vascular in origin.  Difficult to exclude adenopathy or mass.  Attention to this on follow-up two- view exam when the patient is able.  No mass seen at this level on CT of 06/09/2011 when the patient was noted of large right-sided pulmonary embolus.  Previously noted hiatal hernia is not as well delineated on the current exam.  IMPRESSION: Cardiomegaly.  Tortuous aorta.  No infiltrate, congestive heart failure or pneumothorax.  Prominence of the right hilum may be vascular in origin.  Difficult to exclude adenopathy or mass.  Attention to this on follow-up two- view exam when the patient is able.  No mass seen at this level on CT of 06/09/2011 when the patient was noted of large right-sided pulmonary embolus.   Original Report Authenticated By: Lacy Duverney, M.D.    Ct Head Wo Contrast  01/11/2013  *RADIOLOGY REPORT*  Clinical Data:  Fatigue. Hypertension.  Diabetes.  CT HEAD WITHOUT CONTRAST  Technique:  Contiguous axial images were obtained from the base of the skull through the vertex without contrast.  Comparison: 10/22/2011  Findings: The brain stem, cerebellum, cerebral peduncles, thalami, basal ganglia, basilar cisterns, and  ventricular system appear unremarkable.  Periventricular and corona radiata white matter hypodensities are most compatible with chronic ischemic microvascular white matter disease.  No intracranial hemorrhage, mass lesion, or acute infarction is identified.  IMPRESSION:  1. Periventricular and corona radiata white matter hypodensities are most compatible with chronic ischemic microvascular white matter disease. 2.  No acute intracranial findings.   Original Report Authenticated By: Gaylyn Rong, M.D.    Ct Chest Wo Contrast  01/11/2013  *RADIOLOGY REPORT*  Clinical Data: Weakness.  Diabetes.  Hypertension.  Nodular density along the right hilum.  CT CHEST WITHOUT CONTRAST  Technique:  Multidetector CT imaging of the chest was performed following the standard protocol without IV contrast.  Comparison: Chest radiograph of 01/11/2013.  Prior chest CT of 06/09/2011.  Findings: Moderate sized hiatal hernia.  Coronary atherosclerosis noted.  Mild cardiomegaly is present.  No pathologic thoracic adenopathy observed.  There is dextroconvex upper lumbar scoliosis.  Stable right upper lobe scarring.  Stable mild atelectasis or scarring in the lower lobes.  The right infrahilar prominence appears to be vascular.  No corresponding nodule/mass observed.  Thoracic spondylosis noted.  IMPRESSION:  1.  The right infrahilar prominence on chest radiography appears to be vascular.  No nodule or mass is identified in this vicinity. 2.  Cardiomegaly with coronary artery and aortic atherosclerosis. 3.  Moderate sized hiatal hernia. 4.  Lumbar scoliosis and thoracic spondylosis. 5.  Mild atelectasis in the lower lobes with stable right upper lobe scarring.   Original Report Authenticated By: Gaylyn Rong, M.D.     Date: 01/11/2013  Rate: 83  Rhythm: normal sinus rhythm  QRS Axis: normal  Intervals: normal  ST/T Wave abnormalities: normal  Conduction Disutrbances:none  Narrative Interpretation:   Old EKG Reviewed:  unchanged No change in EKG compared to 01/13/2011   Results for orders placed during the hospital encounter of 01/11/13  URINALYSIS, ROUTINE W REFLEX MICROSCOPIC      Result Value Range   Color, Urine YELLOW  YELLOW   APPearance CLEAR  CLEAR   Specific Gravity, Urine <1.005 (*) 1.005 - 1.030   pH 6.0  5.0 - 8.0   Glucose, UA NEGATIVE  NEGATIVE mg/dL   Hgb urine dipstick LARGE (*) NEGATIVE   Bilirubin Urine NEGATIVE  NEGATIVE   Ketones, ur NEGATIVE  NEGATIVE mg/dL   Protein, ur NEGATIVE  NEGATIVE mg/dL   Urobilinogen, UA 0.2  0.0 - 1.0 mg/dL   Nitrite POSITIVE (*) NEGATIVE   Leukocytes, UA LARGE (*) NEGATIVE  COMPREHENSIVE METABOLIC PANEL      Result Value Range   Sodium 138  135 - 145 mEq/L   Potassium 4.9  3.5 - 5.1 mEq/L   Chloride 102  96 - 112 mEq/L   CO2 26  19 - 32 mEq/L   Glucose, Bld 105 (*) 70 - 99 mg/dL   BUN 20  6 - 23 mg/dL   Creatinine, Ser 4.09 (*) 0.50 - 1.10 mg/dL   Calcium 9.9  8.4 - 81.1 mg/dL   Total Protein 7.5  6.0 - 8.3 g/dL   Albumin 3.8  3.5 - 5.2 g/dL   AST 17  0 - 37 U/L   ALT <5  0 - 35 U/L   Alkaline Phosphatase 86  39 - 117 U/L   Total Bilirubin 0.2 (*) 0.3 - 1.2 mg/dL   GFR calc non Af Amer 35 (*) >90 mL/min   GFR calc Af Amer 40 (*) >90 mL/min  CBC WITH DIFFERENTIAL      Result Value Range   WBC 4.2  4.0 - 10.5 K/uL   RBC 3.62 (*) 3.87 - 5.11 MIL/uL   Hemoglobin 11.9 (*) 12.0 - 15.0 g/dL   HCT 91.4 (*) 78.2 - 95.6 %   MCV 97.2  78.0 - 100.0 fL   MCH 32.9  26.0 - 34.0 pg   MCHC 33.8  30.0 - 36.0 g/dL   RDW 21.3  08.6 - 57.8 %   Platelets 269  150 - 400 K/uL   Neutrophils Relative 62  43 - 77 %   Neutro Abs 2.6  1.7 - 7.7 K/uL   Lymphocytes Relative 25  12 - 46 %   Lymphs Abs 1.1  0.7 - 4.0 K/uL   Monocytes Relative 10  3 - 12 %   Monocytes Absolute 0.4  0.1 - 1.0 K/uL   Eosinophils Relative 3  0 - 5 %   Eosinophils Absolute 0.1  0.0 - 0.7 K/uL   Basophils Relative 1  0 - 1 %   Basophils Absolute 0.0  0.0 - 0.1 K/uL  TROPONIN I       Result Value Range   Troponin I <0.30  <0.30 ng/mL  URINE MICROSCOPIC-ADD ON      Result Value Range   WBC, UA TOO NUMEROUS TO COUNT  <3 WBC/hpf   RBC / HPF TOO NUMEROUS TO COUNT  <3 RBC/hpf   Bacteria, UA MANY (*) RARE     1. Urinary tract infection   2. Fatigue       MDM   Patient brought in by family for a generalized weakness fatigue and decreasing mental status today. Symptoms seem to start mildly last evening. Patient has a  history of frequent and recurrent urinary tract infections. Currently on trimethoprim as a suppressive antibiotic by her urologist. Today's urinalysis is significantly abnormal however she's had many abnormal ones in the past. Urine culture has been sent. Patient received 1 g of Rocephin IV piggyback aero be discharged home on Keflex for the urinary tract infection. Urine culture is pending.  Rest of the patient's workup without significant findings. Creatinine is a little elevated consistent with her being a little dehydrated patient received IV normal saline in the emergency department. No significant leukocytosis no significant electrolyte abnormalities. Head CT was negative chest x-ray raises concern for hilar mass CT of the chest was negative for any hilar mass. Also negative for pneumonia or pulmonary edema. EKG without any acute changes. Troponin was negative. Not consistent with acute cardiac event. Patient's symptoms also not consistent with a CVA. On exam she follows commands well moves all extremities and motor muscles are intact.  Patient has good family support at home if she does not improve in 2 days they will return to also return for any newer worse symptoms. They will also contact her urologist for recommendations on further treatment.  Patient's family were given the option for potential admission. After extensive workup today and not sure it is absolutely required. It appears to be symptoms related to the urinary tract infection family was  given the option for a Mission they decided they wanted to take her home to return if she not better in 2 days.  I personally performed the services described in this documentation, which was scribed in my presence. The recorded information has been reviewed and is accurate.     Amy Jakes, MD 01/11/13 1427  Amy Jakes, MD 01/11/13 1428  Amy Jakes, MD 01/11/13 [phone]

## 2013-01-13 LAB — URINE CULTURE

## 2013-01-14 ENCOUNTER — Telehealth (HOSPITAL_COMMUNITY): Payer: Self-pay | Admitting: Emergency Medicine

## 2013-01-14 NOTE — ED Notes (Signed)
Patient has +Urine culture. Checking to see if treated appropriately. °

## 2013-01-14 NOTE — ED Notes (Signed)
+  Urine. Patient treated with Keflex. Sensitive to same. Per protocol MD. °

## 2013-02-27 ENCOUNTER — Ambulatory Visit (INDEPENDENT_AMBULATORY_CARE_PROVIDER_SITE_OTHER): Payer: Medicare Other | Admitting: Urology

## 2013-02-27 DIAGNOSIS — N952 Postmenopausal atrophic vaginitis: Secondary | ICD-10-CM

## 2013-02-27 DIAGNOSIS — N390 Urinary tract infection, site not specified: Secondary | ICD-10-CM

## 2013-04-21 ENCOUNTER — Emergency Department (HOSPITAL_COMMUNITY)
Admission: EM | Admit: 2013-04-21 | Discharge: 2013-04-21 | Disposition: A | Discharge status: Home / Self Care | Payer: Medicare Other | Attending: Emergency Medicine | Admitting: Emergency Medicine

## 2013-04-21 ENCOUNTER — Emergency Department (HOSPITAL_COMMUNITY): Payer: Medicare Other

## 2013-04-21 ENCOUNTER — Encounter (HOSPITAL_COMMUNITY): Payer: Self-pay | Admitting: Emergency Medicine

## 2013-04-21 DIAGNOSIS — Z8639 Personal history of other endocrine, nutritional and metabolic disease: Secondary | ICD-10-CM | POA: Insufficient documentation

## 2013-04-21 DIAGNOSIS — E119 Type 2 diabetes mellitus without complications: Secondary | ICD-10-CM | POA: Insufficient documentation

## 2013-04-21 DIAGNOSIS — Z86711 Personal history of pulmonary embolism: Secondary | ICD-10-CM | POA: Insufficient documentation

## 2013-04-21 DIAGNOSIS — G20 Parkinson's disease: Secondary | ICD-10-CM | POA: Insufficient documentation

## 2013-04-21 DIAGNOSIS — M129 Arthropathy, unspecified: Secondary | ICD-10-CM | POA: Insufficient documentation

## 2013-04-21 DIAGNOSIS — F329 Major depressive disorder, single episode, unspecified: Secondary | ICD-10-CM | POA: Insufficient documentation

## 2013-04-21 DIAGNOSIS — G20A1 Parkinson's disease without dyskinesia, without mention of fluctuations: Secondary | ICD-10-CM | POA: Insufficient documentation

## 2013-04-21 DIAGNOSIS — Y9389 Activity, other specified: Secondary | ICD-10-CM | POA: Insufficient documentation

## 2013-04-21 DIAGNOSIS — I10 Essential (primary) hypertension: Secondary | ICD-10-CM | POA: Insufficient documentation

## 2013-04-21 DIAGNOSIS — Z79899 Other long term (current) drug therapy: Secondary | ICD-10-CM | POA: Insufficient documentation

## 2013-04-21 DIAGNOSIS — R296 Repeated falls: Secondary | ICD-10-CM | POA: Insufficient documentation

## 2013-04-21 DIAGNOSIS — Y9289 Other specified places as the place of occurrence of the external cause: Secondary | ICD-10-CM | POA: Insufficient documentation

## 2013-04-21 DIAGNOSIS — S8263XA Displaced fracture of lateral malleolus of unspecified fibula, initial encounter for closed fracture: Secondary | ICD-10-CM | POA: Insufficient documentation

## 2013-04-21 DIAGNOSIS — F3289 Other specified depressive episodes: Secondary | ICD-10-CM | POA: Insufficient documentation

## 2013-04-21 DIAGNOSIS — Z862 Personal history of diseases of the blood and blood-forming organs and certain disorders involving the immune mechanism: Secondary | ICD-10-CM | POA: Insufficient documentation

## 2013-04-21 DIAGNOSIS — S8262XA Displaced fracture of lateral malleolus of left fibula, initial encounter for closed fracture: Secondary | ICD-10-CM

## 2013-04-21 DIAGNOSIS — K219 Gastro-esophageal reflux disease without esophagitis: Secondary | ICD-10-CM | POA: Insufficient documentation

## 2013-04-21 NOTE — ED Notes (Signed)
[facility] called for transport back home.  Dispatcher said it may be awhile.  Nurse informed.

## 2013-04-21 NOTE — ED Notes (Signed)
Patient fell on Wednesday and hurt left ankle/foot.  Patient c/o increased pain that started this morning.

## 2013-04-21 NOTE — ED Notes (Signed)
Miller MD at bedside. 

## 2013-04-21 NOTE — ED Provider Notes (Signed)
History    This chart was scribed for Vida Roller, MD by Leone Payor, ED Scribe. This patient was seen in room APA05/APA05 and the patient's care was started 3:00 PM.  CSN: [id number] Arrival date & time 04/21/13  1424  First MD Initiated Contact with Patient 04/21/13 1456     Chief Complaint  Patient presents with  . Ankle Pain  . Fall    The history is provided by the patient and a caregiver. No language interpreter was used.    HPI Comments: [name] is a 79 y.o. female who presents to the Emergency Department complaining of a fall that occurred 3 days ago when pt hurt her L ankle/foot. According to caregiver, pt's L ankle folded underneath her when she fell. She also has some L knee pain. States she was able to walk the day after the fall but started to have increased pain yesterday and today. Pt normally walks with a walker. Pt has taken tramadol at home earlier today.  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  She has had associated redness to the ankle.  Past Medical History  Diagnosis Date  . Hypertension   . GERD (gastroesophageal reflux disease)   . Parkinson disease   . Depression   . Pulmonary embolism   . Diabetes mellitus   . Arthritis   . Hypercalcemia    Past Surgical History  Procedure Laterality Date  . Esophagogastroduodenoscopy  06/10/2011    Procedure: ESOPHAGOGASTRODUODENOSCOPY (EGD);  Surgeon: Malissa Hippo, MD;  Location: [facility];  Service: Endoscopy;  Laterality: N/A;  . Vena cava filter placement    . Esophagogastroduodenoscopy  07/29/2011    Procedure: ESOPHAGOGASTRODUODENOSCOPY (EGD);  Surgeon: Malissa Hippo, MD;  Location: [facility];  Service: Endoscopy;  Laterality: N/A;  . Laparoscopic nissen fundoplication  08/09/2011    Procedure: LAPAROSCOPIC NISSEN FUNDOPLICATION;  Surgeon: Fabio Bering;  Location: [facility];  Service: General;  Laterality: N/A;  Laparoscopic Hiatal Hernia Repair  . Gastrostomy   08/09/2011    Procedure: GASTROSTOMY;  Surgeon: Fabio Bering;  Location: [facility];  Service: General;  Laterality: Left;  Laparoscopic Gastrostomy Tube placement   No family history on file. History  Substance Use Topics  . Smoking status: Never Smoker   . Smokeless tobacco: Not on file  . Alcohol Use: No   OB History   Grav Para Term Preterm Abortions TAB SAB Ect Mult Living                 Review of Systems  Musculoskeletal: Positive for gait problem. Negative for back pain.  Skin: Negative for rash and wound.   A complete 10 system review of systems was obtained and all systems are negative except as noted in the HPI and PMH.   Allergies  Metoclopramide hcl  Home Medications   Current Outpatient Rx  Name  Route  Sig  Dispense  Refill  . acetaminophen (TYLENOL) 325 MG tablet   Oral   Take 650 mg by mouth 2 (two) times daily. Also given as needed for mild pain in addition to twice daily         . amitriptyline (ELAVIL) 10 MG tablet   Oral   Take 10 mg by mouth at bedtime.           . carbidopa-levodopa (SINEMET) 25-100 MG per tablet   Oral   Take 2 tablets by mouth 3 (three) times daily.           Marland Kitchen  Glycerin-Hypromellose-PEG 400 (SM DRY EYE RELIEF OP)   Ophthalmic   Apply 1 drop to eye daily as needed (for dry eye relief).         . mirtazapine (REMERON) 15 MG tablet   Oral   Take 15 mg by mouth at bedtime.           Marland Kitchen omeprazole (PRILOSEC) 10 MG capsule   Oral   Take 10 mg by mouth every morning.         Marland Kitchen oxymetazoline (NASAL SPRAY 12 HOUR) 0.05 % nasal spray   Nasal   Place 1-2 sprays into the nose daily as needed for congestion.         Marland Kitchen PARoxetine (PAXIL) 10 MG tablet   Oral   Take 10 mg by mouth every morning.         . traMADol (ULTRAM) 50 MG tablet   Oral   Take 50 mg by mouth daily as needed for pain.         Marland Kitchen trimethoprim (TRIMPEX) 100 MG tablet   Oral   Take 100 mg by mouth at bedtime.          BP 118/58  Pulse  79  Temp(Src) 98 F (36.7 C) (Oral)  Resp 22  Ht 5' (1.524 m)  Wt 126 lb (57.153 kg)  BMI 24.61 kg/m2  SpO2 96% Physical Exam  Nursing note and vitals reviewed. Constitutional: She is oriented to person, place, and time. She appears well-developed and well-nourished.  HENT:  Head: Normocephalic and atraumatic.  Eyes: Conjunctivae and EOM are normal. Pupils are equal, round, and reactive to light.  Neck: Normal range of motion. Neck supple.  Cardiovascular: Normal rate, regular rhythm and normal heart sounds.   Pulmonary/Chest: Effort normal and breath sounds normal.  Abdominal: Soft. Bowel sounds are normal.  Musculoskeletal: Normal range of motion.  ttp over the L lateral ankle distal maleolus, no sig swelling, mild erytyhema.  Also with ttp over the proximal fibula.  No deformity.  Chronic inverted ankles bilaterally - no edema, normal pulses bilaterally, normal CRT at toes.  Neurological: She is alert and oriented to person, place, and time.  Skin: Skin is warm and dry.  Psychiatric: She has a normal mood and affect.    ED Course  Procedures (including critical care time)  DIAGNOSTIC STUDIES: Oxygen Saturation is 96% on RA, adequate by my interpretation.    COORDINATION OF CARE: 3:15 PM Discussed treatment plan with pt at bedside and pt agreed to plan.   Labs Reviewed - No data to display Dg Ankle Complete Left  04/21/2013   *RADIOLOGY REPORT*  Clinical Data: Fall.  Ankle pain.  LEFT ANKLE COMPLETE - 3+ VIEW  Comparison: None.  Findings: Segmental fracture of the distal fibula noted with a suspected oblique fracture 7.5 cm proximal to the distal fibular tip in the transverse component 4.8 cm proximal to the distal fibular tip.  There also appears to be a longitudinal component of the fracture involving the intermediary segment.  Talar dome intact.  No definite medial malleolar fracture. Posterior malleolus intact.  The foot appears inverted during imaging.  IMPRESSION:  1.   Segmental fracture of the distal fibular metadiaphysis. 2.  Foot inverted during imaging, possibly incidental or based on positioning.   Original Report Authenticated By: Gaylyn Rong, M.D.   Dg Knee Complete 4 Views Left  04/21/2013   *RADIOLOGY REPORT*  Clinical Data: Fall.  Knee and ankle pain.  LEFT KNEE - COMPLETE  4+ VIEW  Comparison: None.  Findings: Atherosclerosis noted.  Mild lateral articular space narrowing with marginal spurring.  No significant knee effusion. No fracture observed.  IMPRESSION:  1.  Marginal spurring and mild loss of articular space in the lateral compartment the knee.  No acute bony findings are identified. 2.  Atherosclerosis.   Original Report Authenticated By: Gaylyn Rong, M.D.   1. Ankle fracture, lateral malleolus, closed, left, initial encounter     MDM  Pt has ankle injury - r/o frac / ma[place]uve, ice, elevate, had trmadol prior to arrival.  Pt has been intermittently mildly weight bearing since.  I have viewed the xrays, the knee is not broken, the distal lateral mal of the L ankle is broken, sugar tong ordered, non weight bearing, family and caregiver informed.  Is closed injury - stable for f/u.  Meds given in ED:  Medications - No data to display  New Prescriptions   No medications on file     I personally performed the services described in this documentation, which was scribed in my presence. The recorded information has been reviewed and is accurate.      Vida Roller, MD 04/21/13 1550

## 2013-04-30 ENCOUNTER — Ambulatory Visit (INDEPENDENT_AMBULATORY_CARE_PROVIDER_SITE_OTHER): Payer: Medicare Other | Admitting: Orthopedic Surgery

## 2013-04-30 ENCOUNTER — Encounter: Payer: Self-pay | Admitting: Orthopedic Surgery

## 2013-04-30 VITALS — BP 118/68 | Ht 60.0 in | Wt 126.0 lb

## 2013-04-30 DIAGNOSIS — L8990 Pressure ulcer of unspecified site, unspecified stage: Secondary | ICD-10-CM | POA: Insufficient documentation

## 2013-04-30 DIAGNOSIS — S8263XA Displaced fracture of lateral malleolus of unspecified fibula, initial encounter for closed fracture: Secondary | ICD-10-CM

## 2013-04-30 DIAGNOSIS — L98499 Non-pressure chronic ulcer of skin of other sites with unspecified severity: Secondary | ICD-10-CM

## 2013-04-30 DIAGNOSIS — S8262XA Displaced fracture of lateral malleolus of left fibula, initial encounter for closed fracture: Secondary | ICD-10-CM

## 2013-04-30 HISTORY — DX: Displaced fracture of lateral malleolus of unspecified fibula, initial encounter for closed fracture: S82.63XA

## 2013-04-30 NOTE — Patient Instructions (Signed)
DRESSING CHANGES DAILY   AIR CAST FOR WALKING   MAY REMOVE WHEN IN BED

## 2013-04-30 NOTE — Progress Notes (Signed)
Patient ID: Christianne Borrow, female   DOB: 1922/12/13, 79 y.o.   MRN: [id number] Chief Complaint  Patient presents with  . Ankle Pain    Left ankle fracture d/t injury 04/18/13   The patient had fall on Wednesday, June 25, started having pain Saturday, June 27. Went to the ER. X-rays show a lateral malleolus fracture. The patient is on tramadol 50 mg every 4 as needed. She complains of dull throbbing 5/10 intermittent pain with some tingling and swelling and worse with standing  Review of systems negative findings no chest pain no skin changes no poor healing no rash no itching no redness on a regular basis. Complains of shortness of breath, cough, snoring heartburn with frequent UTIs frequency and urgency nervousness anxiety depression unsteady gait and tremor from Parkinson's disease  The past, family history and social history have been reviewed and are recorded in the corresponding sections of epic   BP 118/68  Ht 5' (1.524 m)  Wt 126 lb (57.153 kg)  BMI 24.61 kg/m2 General appearance is normal, the patient is alert and oriented x3 with normal mood and affect. Her frame is very small she is very tiny has a size 5 foot. She does not walk without assistive device walker and help with standby set her to balance issues  Just to pressure areas on her foot one medial one lateral to medial one is more in the leg the lateral one is more in the foot at the end of her splint. She sugar tong splint. She is a chronic plantar flexion supination deformity. With the knee flexed the foot could be brought in neutral position but tends to slide back into inversion. Ankle is stable. Muscle tone is normal. Skin breaks as noted lateral foot and medial ankle she has a good pulse and normal color with normal sensation but she has a tremor  X-rays show a fracture of the lateral malleolus in 2 places  I tried a Cam Walker was uncomfortable with the position of her foot so I put her in an Ace bandage with underlying  Xeroform and 4 x 4 dressing over the wounds and air cast and now allowing her weightbearing as tolerated I do not think she can tolerate bearing she would have to be carried from place to place  Encounter Diagnosis  Name Primary?  . Lateral malleolar fracture, left, closed, initial encounter Yes    Return in 2 weeks to check the wound she is allowed to put triple antibiotic on it at each dressing changes daily  Wear brace for walking which is an Aircast  X-ray of 4 weeks and then again in 8 weeks

## 2013-05-04 ENCOUNTER — Emergency Department (HOSPITAL_COMMUNITY): Payer: Medicare Other

## 2013-05-04 ENCOUNTER — Encounter (HOSPITAL_COMMUNITY): Payer: Self-pay | Admitting: *Deleted

## 2013-05-04 ENCOUNTER — Emergency Department (HOSPITAL_COMMUNITY)
Admission: EM | Admit: 2013-05-04 | Discharge: 2013-05-04 | Disposition: A | Discharge status: Home / Self Care | Payer: Medicare Other | Attending: Emergency Medicine | Admitting: Emergency Medicine

## 2013-05-04 DIAGNOSIS — Z8739 Personal history of other diseases of the musculoskeletal system and connective tissue: Secondary | ICD-10-CM | POA: Insufficient documentation

## 2013-05-04 DIAGNOSIS — Z862 Personal history of diseases of the blood and blood-forming organs and certain disorders involving the immune mechanism: Secondary | ICD-10-CM | POA: Insufficient documentation

## 2013-05-04 DIAGNOSIS — F3289 Other specified depressive episodes: Secondary | ICD-10-CM | POA: Insufficient documentation

## 2013-05-04 DIAGNOSIS — Z79899 Other long term (current) drug therapy: Secondary | ICD-10-CM | POA: Insufficient documentation

## 2013-05-04 DIAGNOSIS — I10 Essential (primary) hypertension: Secondary | ICD-10-CM | POA: Insufficient documentation

## 2013-05-04 DIAGNOSIS — N390 Urinary tract infection, site not specified: Secondary | ICD-10-CM

## 2013-05-04 DIAGNOSIS — F329 Major depressive disorder, single episode, unspecified: Secondary | ICD-10-CM | POA: Insufficient documentation

## 2013-05-04 DIAGNOSIS — G20A1 Parkinson's disease without dyskinesia, without mention of fluctuations: Secondary | ICD-10-CM | POA: Insufficient documentation

## 2013-05-04 DIAGNOSIS — K219 Gastro-esophageal reflux disease without esophagitis: Secondary | ICD-10-CM | POA: Insufficient documentation

## 2013-05-04 DIAGNOSIS — G20 Parkinson's disease: Secondary | ICD-10-CM | POA: Insufficient documentation

## 2013-05-04 DIAGNOSIS — Z8639 Personal history of other endocrine, nutritional and metabolic disease: Secondary | ICD-10-CM | POA: Insufficient documentation

## 2013-05-04 DIAGNOSIS — E119 Type 2 diabetes mellitus without complications: Secondary | ICD-10-CM | POA: Insufficient documentation

## 2013-05-04 DIAGNOSIS — Z86711 Personal history of pulmonary embolism: Secondary | ICD-10-CM | POA: Insufficient documentation

## 2013-05-04 LAB — COMPREHENSIVE METABOLIC PANEL
ALT: <5 U/L (ref 0–35)
CO2: 25 mEq/L (ref 19–32)
Calcium: 9.3 mg/dL (ref 8.4–10.5)
GFR calc Af Amer: 48 mL/min — ABNORMAL LOW (ref >90)
GFR calc non Af Amer: 41 mL/min — ABNORMAL LOW (ref >90)
Glucose, Bld: 109 mg/dL — ABNORMAL HIGH (ref 70–99)
Sodium: 129 mEq/L — ABNORMAL LOW (ref 135–145)
Total Bilirubin: 0.2 mg/dL — ABNORMAL LOW (ref 0.3–1.2)

## 2013-05-04 LAB — URINALYSIS, ROUTINE W REFLEX MICROSCOPIC
Bilirubin Urine: NEGATIVE
Ketones, ur: NEGATIVE mg/dL
Nitrite: POSITIVE — AB
Protein, ur: NEGATIVE mg/dL
Specific Gravity, Urine: 1.01 (ref 1.005–1.030)
Urobilinogen, UA: 0.2 mg/dL (ref 0.0–1.0)

## 2013-05-04 LAB — URINE MICROSCOPIC-ADD ON

## 2013-05-04 LAB — CBC WITH DIFFERENTIAL/PLATELET
Eosinophils Relative: 1 % (ref 0–5)
HCT: 33.1 % — ABNORMAL LOW (ref 36.0–46.0)
Lymphocytes Relative: 10 % — ABNORMAL LOW (ref 12–46)
Lymphs Abs: 0.7 10*3/uL (ref 0.7–4.0)
MCV: 95.9 fL (ref 78.0–100.0)
Monocytes Absolute: 0.8 10*3/uL (ref 0.1–1.0)
Platelets: 255 10*3/uL (ref 150–400)
RBC: 3.45 MIL/uL — ABNORMAL LOW (ref 3.87–5.11)
WBC: 6.7 10*3/uL (ref 4.0–10.5)

## 2013-05-04 MED ORDER — DEXTROSE 5 % IV SOLN
INTRAVENOUS | Status: AC
  Administered 2013-05-04: 1 g via INTRAVENOUS
  Filled 2013-05-04: qty 10

## 2013-05-04 MED ORDER — CEFTRIAXONE SODIUM 1 G IJ SOLR: 1.0000 g | Freq: Once | INTRAMUSCULAR | Status: DC

## 2013-05-04 MED ORDER — DEXTROSE 5 % IV SOLN: 1.0000 g | INTRAVENOUS | Status: DC

## 2013-05-04 MED ORDER — DEXTROSE 5 % IV SOLN: 1.0000 g | Freq: Once | INTRAVENOUS | Status: DC

## 2013-05-04 MED ORDER — CEPHALEXIN 500 MG PO CAPS: 500.0000 mg | ORAL_CAPSULE | Freq: Four times a day (QID) | ORAL | Status: DC

## 2013-05-04 MED ORDER — SODIUM CHLORIDE 0.9 % IV BOLUS (SEPSIS)
250.0000 mL | Freq: Once | INTRAVENOUS | Status: AC
  Administered 2013-05-04: 250 mL via INTRAVENOUS

## 2013-05-04 NOTE — ED Provider Notes (Signed)
History    This chart was scribed for Amy Lennert, MD by [name], ED Scribe and [name], ED Scribe. The patient was seen in room APA10/APA10 and the patient's care was started at 9:17 AM.   CSN: [id number] Arrival date & time 05/04/13  0858  First MD Initiated Contact with Patient 05/04/13 [phone]     Chief Complaint  Patient presents with  . Altered Mental Status    LEVEL 5 CAVEAT: Altered mental status  Patient is a 79 y.o. female presenting with altered mental status. The history is provided by a relative and medical records. No language interpreter was used.  Altered Mental Status Presenting symptoms: behavior changes and disorientation   Severity:  Moderate Most recent episode:  Today Duration:  1 day Timing:  Constant   HPI Comments: [name] is a 79 y.o. female who presents to the Emergency Department with family member complaining of patient's altered mental state with onset of yesterday morning upon waking up. Pt's speech is rambled, comprehensible but unable to follow thought process. Per family member reports that pt was yelling and screaming throughout the night at hallucinations described as dead family members and her behavior has been more agitated this morning. Family member reports that pt's last similar episode was due to a UTI; however, pt's family member denies dark, cloudy or odorous urine from pt (pt wears depends). Per family member reports the pt has been eating and drinking regularly ( cranberry juice and water) since onset. Family denies speaking with Dr. [name] on the symptoms. Pt was last seen at ED for a fracture to her left ankle on April 21, 2012 and family denies any complications.   PCP is Dr. [name], last visit was one year ago  Past Medical History  Diagnosis Date  . Hypertension   . GERD (gastroesophageal reflux disease)   . Parkinson disease   . Depression   . Pulmonary embolism   . Diabetes mellitus   . Arthritis   .  Hypercalcemia    Past Surgical History  Procedure Laterality Date  . Esophagogastroduodenoscopy  06/10/2011    Procedure: ESOPHAGOGASTRODUODENOSCOPY (EGD);  Surgeon: Malissa Hippo, MD;  [facility];  Service: Endoscopy;  Laterality: N/A;  . Vena cava filter placement    . Esophagogastroduodenoscopy  07/29/2011    Procedure: ESOPHAGOGASTRODUODENOSCOPY (EGD);  Surgeon: Malissa Hippo, MD;  Location: [facility];  Service: Endoscopy;  Laterality: N/A;  . Laparoscopic nissen fundoplication  08/09/2011    Procedure: LAPAROSCOPIC NISSEN FUNDOPLICATION;  Surgeon: Fabio Bering;  Location: [facility];  Service: General;  Laterality: N/A;  Laparoscopic Hiatal Hernia Repair  . Gastrostomy  08/09/2011    Procedure: GASTROSTOMY;  Surgeon: Fabio Bering;  Location: [facility];  Service: General;  Laterality: Left;  Laparoscopic Gastrostomy Tube placement   No family history on file. History  Substance Use Topics  . Smoking status: Never Smoker   . Smokeless tobacco: Not on file  . Alcohol Use: No   OB History   Grav Para Term Preterm Abortions TAB SAB Ect Mult Living                 Review of Systems  Unable to perform ROS: Mental status change  Psychiatric/Behavioral: Positive for altered mental status.    Allergies  Metoclopramide hcl  Home Medications   Current Outpatient Rx  Name  Route  Sig  Dispense  Refill  . acetaminophen (TYLENOL) 325 MG tablet  Oral   Take 650 mg by mouth 2 (two) times daily. Also given as needed for mild pain in addition to twice daily         . amitriptyline (ELAVIL) 10 MG tablet   Oral   Take 10 mg by mouth at bedtime.           . carbidopa-levodopa (SINEMET) 25-100 MG per tablet   Oral   Take 2 tablets by mouth 3 (three) times daily.           . Glycerin-Hypromellose-PEG 400 (SM DRY EYE RELIEF OP)   Ophthalmic   Apply 1 drop to eye daily as needed (for dry eye relief).         . mirtazapine (REMERON) 15 MG tablet   Oral    Take 15 mg by mouth at bedtime.           Marland Kitchen omeprazole (PRILOSEC) 10 MG capsule   Oral   Take 10 mg by mouth every morning.         Marland Kitchen oxymetazoline (NASAL SPRAY 12 HOUR) 0.05 % nasal spray   Nasal   Place 1-2 sprays into the nose daily as needed for congestion.         Marland Kitchen PARoxetine (PAXIL) 10 MG tablet   Oral   Take 10 mg by mouth every morning.         . traMADol (ULTRAM) 50 MG tablet   Oral   Take 50 mg by mouth daily as needed for pain.         Marland Kitchen trimethoprim (TRIMPEX) 100 MG tablet   Oral   Take 100 mg by mouth at bedtime.          BP 114/53  Pulse 86  Temp(Src) 98.1 F (36.7 C) (Oral)  Resp 20  SpO2 97% Physical Exam  Nursing note and vitals reviewed. Constitutional: She appears well-developed.  HENT:  Head: Normocephalic.  Mucous membranes are dry  Eyes: Conjunctivae and EOM are normal. No scleral icterus.  Neck: Neck supple. No thyromegaly present.  Cardiovascular: Normal rate and regular rhythm.  Exam reveals no gallop and no friction rub.   No murmur heard. Pulmonary/Chest: No stridor. She has no wheezes. She has no rales. She exhibits no tenderness.  Abdominal: She exhibits no distension. There is no tenderness. There is no rebound.  Musculoskeletal: Normal range of motion. She exhibits no edema.  Splint to the left ankle  Lymphadenopathy:    She has no cervical adenopathy.  Neurological: Coordination normal.  Alert and oriented to person and place only  Skin: No rash noted. No erythema.  Psychiatric: She has a normal mood and affect. Her behavior is normal.    ED Course  Procedures (including critical care time)  DIAGNOSTIC STUDIES: Oxygen Saturation is 97% on room air, normal by my interpretation.    COORDINATION OF CARE: 9:20 AM Discussed course of care with pt and family. Family understands and agrees. 12:11 PM Discussed plan of treatment and result of lab and radiologist result. Pt family agree and pt is discharged.    Labs  Reviewed  CBC WITH DIFFERENTIAL - Abnormal; Notable for the following:    RBC 3.45 (*)    Hemoglobin 11.3 (*)    HCT 33.1 (*)    Lymphocytes Relative 10 (*)    All other components within normal limits  COMPREHENSIVE METABOLIC PANEL - Abnormal; Notable for the following:    Sodium 129 (*)    Glucose, Bld 109 (*)  Creatinine, Ser 1.15 (*)    Alkaline Phosphatase 128 (*)    Total Bilirubin 0.2 (*)    GFR calc non Af Amer 41 (*)    GFR calc Af Amer 48 (*)    All other components within normal limits  URINALYSIS, ROUTINE W REFLEX MICROSCOPIC - Abnormal; Notable for the following:    Hgb urine dipstick TRACE (*)    Nitrite POSITIVE (*)    Leukocytes, UA LARGE (*)    All other components within normal limits  URINE MICROSCOPIC-ADD ON - Abnormal; Notable for the following:    Bacteria, UA MANY (*)    All other components within normal limits  URINE CULTURE   Ct Head Wo Contrast  05/04/2013   *RADIOLOGY REPORT*  Clinical Data: Altered mental status.  CT HEAD WITHOUT CONTRAST  Technique:  Contiguous axial images were obtained from the base of the skull through the vertex without contrast.  Comparison: CT head without contrast 01/11/2013.  Findings: Moderate generalized atrophy and white matter disease is not significantly changed.  No acute cortical infarct, hemorrhage, mass lesion is present.  The ventricles are proportionate to the degree of atrophy.  No significant extra-axial fluid collection is present.  The paranasal sinuses and mastoid air cells are clear.  The osseous skull is intact.  IMPRESSION:  1.  Stable atrophy and white matter disease is most compatible with chronic microvascular ischemic changes. 2.  No acute intracranial abnormality.   Original Report Authenticated By: Marin Roberts, M.D.   No diagnosis found.  MDM   The chart was scribed for me under my direct supervision.  I personally performed the history, physical, and medical decision making and all procedures  in the evaluation of this patient.Amy Lennert, MD 05/04/13 [phone]

## 2013-05-04 NOTE — ED Notes (Signed)
[facility] called for transport home  

## 2013-05-04 NOTE — ED Notes (Signed)
Family reports pt woke up yesterday morning with increased confusion and rambling speech.  Speech is comprehensible but unable to follow thought process.  Pt alert to self and place at this time.  C/o back pain.

## 2013-05-06 ENCOUNTER — Emergency Department (HOSPITAL_COMMUNITY): Payer: Medicare Other

## 2013-05-06 ENCOUNTER — Encounter (HOSPITAL_COMMUNITY): Payer: Self-pay

## 2013-05-06 ENCOUNTER — Inpatient Hospital Stay (HOSPITAL_COMMUNITY)
Admission: EM | Admit: 2013-05-06 | Discharge: 2013-05-09 | DRG: 690 | Disposition: A | Discharge status: Home Health | Payer: Medicare Other | Attending: Internal Medicine | Admitting: Internal Medicine

## 2013-05-06 DIAGNOSIS — M129 Arthropathy, unspecified: Secondary | ICD-10-CM | POA: Diagnosis present

## 2013-05-06 DIAGNOSIS — E119 Type 2 diabetes mellitus without complications: Secondary | ICD-10-CM | POA: Diagnosis present

## 2013-05-06 DIAGNOSIS — A498 Other bacterial infections of unspecified site: Secondary | ICD-10-CM | POA: Diagnosis present

## 2013-05-06 DIAGNOSIS — K219 Gastro-esophageal reflux disease without esophagitis: Secondary | ICD-10-CM | POA: Diagnosis present

## 2013-05-06 DIAGNOSIS — Z86711 Personal history of pulmonary embolism: Secondary | ICD-10-CM

## 2013-05-06 DIAGNOSIS — Z79899 Other long term (current) drug therapy: Secondary | ICD-10-CM

## 2013-05-06 DIAGNOSIS — F329 Major depressive disorder, single episode, unspecified: Secondary | ICD-10-CM | POA: Diagnosis present

## 2013-05-06 DIAGNOSIS — G20 Parkinson's disease: Secondary | ICD-10-CM | POA: Diagnosis present

## 2013-05-06 DIAGNOSIS — R4182 Altered mental status, unspecified: Secondary | ICD-10-CM | POA: Diagnosis present

## 2013-05-06 DIAGNOSIS — I10 Essential (primary) hypertension: Secondary | ICD-10-CM | POA: Diagnosis present

## 2013-05-06 DIAGNOSIS — S8263XA Displaced fracture of lateral malleolus of unspecified fibula, initial encounter for closed fracture: Secondary | ICD-10-CM

## 2013-05-06 DIAGNOSIS — F411 Generalized anxiety disorder: Secondary | ICD-10-CM | POA: Diagnosis present

## 2013-05-06 DIAGNOSIS — Z66 Do not resuscitate: Secondary | ICD-10-CM | POA: Diagnosis present

## 2013-05-06 DIAGNOSIS — G20A1 Parkinson's disease without dyskinesia, without mention of fluctuations: Secondary | ICD-10-CM | POA: Diagnosis present

## 2013-05-06 DIAGNOSIS — E860 Dehydration: Secondary | ICD-10-CM

## 2013-05-06 DIAGNOSIS — F3289 Other specified depressive episodes: Secondary | ICD-10-CM | POA: Diagnosis present

## 2013-05-06 DIAGNOSIS — R410 Disorientation, unspecified: Secondary | ICD-10-CM

## 2013-05-06 DIAGNOSIS — E871 Hypo-osmolality and hyponatremia: Secondary | ICD-10-CM | POA: Diagnosis present

## 2013-05-06 DIAGNOSIS — N390 Urinary tract infection, site not specified: Principal | ICD-10-CM | POA: Diagnosis present

## 2013-05-06 LAB — BASIC METABOLIC PANEL
BUN: 20 mg/dL (ref 6–23)
Calcium: 9.3 mg/dL (ref 8.4–10.5)
GFR calc Af Amer: 43 mL/min — ABNORMAL LOW (ref >90)
GFR calc non Af Amer: 37 mL/min — ABNORMAL LOW (ref >90)
Potassium: 4.6 mEq/L (ref 3.5–5.1)
Sodium: 130 mEq/L — ABNORMAL LOW (ref 135–145)

## 2013-05-06 LAB — URINALYSIS, ROUTINE W REFLEX MICROSCOPIC
Bilirubin Urine: NEGATIVE
Hgb urine dipstick: NEGATIVE
Ketones, ur: NEGATIVE mg/dL
Nitrite: NEGATIVE
Protein, ur: NEGATIVE mg/dL
Urobilinogen, UA: 0.2 mg/dL (ref 0.0–1.0)

## 2013-05-06 LAB — CBC WITH DIFFERENTIAL/PLATELET
Basophils Relative: 0 % (ref 0–1)
Eosinophils Absolute: 0.1 10*3/uL (ref 0.0–0.7)
Eosinophils Relative: 1 % (ref 0–5)
MCH: 32.2 pg (ref 26.0–34.0)
MCHC: 33.5 g/dL (ref 30.0–36.0)
MCV: 96.2 fL (ref 78.0–100.0)
Neutrophils Relative %: 71 % (ref 43–77)
Platelets: 259 10*3/uL (ref 150–400)

## 2013-05-06 LAB — URINE MICROSCOPIC-ADD ON

## 2013-05-06 MED ORDER — AMITRIPTYLINE HCL 10 MG PO TABS
10.0000 mg | ORAL_TABLET | Freq: Every day | ORAL | Status: DC
  Administered 2013-05-06 – 2013-05-08 (×3): 10 mg via ORAL
  Filled 2013-05-06 (×3): qty 1

## 2013-05-06 MED ORDER — POLYVINYL ALCOHOL 1.4 % OP SOLN
1.0000 [drp] | OPHTHALMIC | Status: DC | PRN
  Filled 2013-05-06: qty 15

## 2013-05-06 MED ORDER — PAROXETINE HCL 20 MG PO TABS
10.0000 mg | ORAL_TABLET | Freq: Every morning | ORAL | Status: DC
  Administered 2013-05-06 – 2013-05-09 (×4): 10 mg via ORAL
  Filled 2013-05-06 (×5): qty 1

## 2013-05-06 MED ORDER — LORAZEPAM 2 MG/ML IJ SOLN
0.5000 mg | Freq: Four times a day (QID) | INTRAMUSCULAR | Status: DC | PRN
  Administered 2013-05-06: 0.5 mg via INTRAVENOUS
  Filled 2013-05-06: qty 1

## 2013-05-06 MED ORDER — TRAMADOL HCL 50 MG PO TABS: 50.0000 mg | ORAL_TABLET | Freq: Four times a day (QID) | ORAL | Status: DC | PRN

## 2013-05-06 MED ORDER — ONDANSETRON HCL 4 MG PO TABS: 4.0000 mg | ORAL_TABLET | Freq: Four times a day (QID) | ORAL | Status: DC | PRN

## 2013-05-06 MED ORDER — PANTOPRAZOLE SODIUM 40 MG PO TBEC: 40.0000 mg | DELAYED_RELEASE_TABLET | Freq: Every day | ORAL | Status: DC

## 2013-05-06 MED ORDER — ONDANSETRON HCL 4 MG/2ML IJ SOLN: 4.0000 mg | Freq: Four times a day (QID) | INTRAMUSCULAR | Status: DC | PRN

## 2013-05-06 MED ORDER — PANTOPRAZOLE SODIUM 40 MG PO TBEC
40.0000 mg | DELAYED_RELEASE_TABLET | Freq: Every day | ORAL | Status: DC
  Administered 2013-05-07 – 2013-05-09 (×3): 40 mg via ORAL
  Filled 2013-05-06 (×3): qty 1

## 2013-05-06 MED ORDER — GLYCERIN-HYPROMELLOSE-PEG 400 0.2-0.2-1 % OP SOLN: 1.0000 [drp] | OPHTHALMIC | Status: DC | PRN

## 2013-05-06 MED ORDER — SODIUM CHLORIDE 0.9 % IV BOLUS (SEPSIS)
500.0000 mL | Freq: Once | INTRAVENOUS | Status: AC
  Administered 2013-05-06: 500 mL via INTRAVENOUS

## 2013-05-06 MED ORDER — DEXTROSE 5 % IV SOLN: 1.0000 g | INTRAVENOUS | Status: DC

## 2013-05-06 MED ORDER — CARBIDOPA-LEVODOPA 25-100 MG PO TABS
2.0000 | ORAL_TABLET | Freq: Three times a day (TID) | ORAL | Status: DC
  Administered 2013-05-06 – 2013-05-09 (×9): 2 via ORAL
  Filled 2013-05-06 (×9): qty 2

## 2013-05-06 MED ORDER — DEXTROSE 5 % IV SOLN
1.0000 g | Freq: Once | INTRAVENOUS | Status: AC
  Administered 2013-05-06: 1 g via INTRAVENOUS
  Filled 2013-05-06: qty 10

## 2013-05-06 MED ORDER — MIRTAZAPINE 30 MG PO TABS
15.0000 mg | ORAL_TABLET | Freq: Every day | ORAL | Status: DC
  Administered 2013-05-06 – 2013-05-08 (×3): 15 mg via ORAL
  Filled 2013-05-06 (×3): qty 1

## 2013-05-06 MED ORDER — HEPARIN SODIUM (PORCINE) 5000 UNIT/ML IJ SOLN
5000.0000 [IU] | Freq: Three times a day (TID) | INTRAMUSCULAR | Status: DC
  Administered 2013-05-06 – 2013-05-09 (×8): 5000 [IU] via SUBCUTANEOUS
  Filled 2013-05-06 (×8): qty 1

## 2013-05-06 MED ORDER — DEXTROSE 5 % IV SOLN
1.0000 g | INTRAVENOUS | Status: DC
  Administered 2013-05-07 – 2013-05-08 (×2): 1 g via INTRAVENOUS
  Filled 2013-05-06 (×4): qty 10

## 2013-05-06 MED ORDER — OXYMETAZOLINE HCL 0.05 % NA SOLN: 1.0000 | Freq: Every day | NASAL | Status: DC | PRN

## 2013-05-06 MED ORDER — ACETAMINOPHEN 325 MG PO TABS
650.0000 mg | ORAL_TABLET | Freq: Two times a day (BID) | ORAL | Status: DC
  Administered 2013-05-06 – 2013-05-09 (×6): 650 mg via ORAL
  Filled 2013-05-06 (×6): qty 2

## 2013-05-06 NOTE — H&P (Signed)
[facility] History and Physical  NINFA GIANNELLI ZOX:[id number] DOB: September 09, 1923 DOA: 05/06/2013  Referring physician: Dr. Bebe Shaggy, ER. PCP: Carylon Perches, MD    Chief Complaint: Altered mental status.  HPI: [name] is a 79 y.o. female who presents to the hospital with a 3 to four-day history of altered mental status. She came to the emergency room 2 days ago and was diagnosed with UTI, seemed appropriate for discharge. Unfortunately her altered mental status has not improved. She has had poor by mouth intake. She has Parkinson's disease for the last 6 years or so. She is able to walk with a walker with assistance. She has 24-hour caregivers at her own home. She needs help with dressing and bathing but is able to feed herself. There is no history of fever but there is a history of night sweats. She is now being admitted because of failure of her change in mental status to improve.   Review of Systems:.  Apart from history of present illness, other systems negative.  Past Medical History  Diagnosis Date  . Hypertension   . GERD (gastroesophageal reflux disease)   . Parkinson disease   . Depression   . Pulmonary embolism   . Diabetes mellitus   . Arthritis   . Hypercalcemia    Past Surgical History  Procedure Laterality Date  . Esophagogastroduodenoscopy  06/10/2011    Procedure: ESOPHAGOGASTRODUODENOSCOPY (EGD);  Surgeon: Malissa Hippo, MD;  [facility];  Service: Endoscopy;  Laterality: N/A;  . Vena cava filter placement    . Esophagogastroduodenoscopy  07/29/2011    Procedure: ESOPHAGOGASTRODUODENOSCOPY (EGD);  Surgeon: Malissa Hippo, MD;  [facility];  Service: Endoscopy;  Laterality: N/A;  . Laparoscopic nissen fundoplication  08/09/2011    Procedure: LAPAROSCOPIC NISSEN FUNDOPLICATION;  Surgeon: Fabio Bering;  Location: [facility];  Service: General;  Laterality: N/A;  Laparoscopic Hiatal Hernia Repair  . Gastrostomy  08/09/2011    Procedure:  GASTROSTOMY;  Surgeon: Fabio Bering;  Location: [facility];  Service: General;  Laterality: Left;  Laparoscopic Gastrostomy Tube placement   Social History:  She lives at home. She has 24-hour caregivers. She does not smoke cigarettes and does not drink alcohol. She requires help with majority of activities of daily living.   Allergies  Allergen Reactions  . Metoclopramide Hcl Other (See Comments)    Affected Nervous system- severe jerkiing, shaking, etc..    No family history on file. noncontributory  Prior to Admission medications   Medication Sig Start Date End Date Taking? Authorizing Provider  acetaminophen (TYLENOL) 325 MG tablet Take 650 mg by mouth 2 (two) times daily. Also given as needed for mild pain in addition to twice daily   Yes Historical Provider, MD  amitriptyline (ELAVIL) 10 MG tablet Take 10 mg by mouth at bedtime.     Yes Historical Provider, MD  carbidopa-levodopa (SINEMET) 25-100 MG per tablet Take 2 tablets by mouth 3 (three) times daily.     Yes Historical Provider, MD  cephALEXin (KEFLEX) 500 MG capsule Take 1 capsule (500 mg total) by mouth 4 (four) times daily. 05/04/13  Yes Benny Lennert, MD  Glycerin-Hypromellose-PEG 400 (SM DRY EYE RELIEF OP) Apply 1 drop to eye daily as needed (for dry eye relief).   Yes Historical Provider, MD  mirtazapine (REMERON) 15 MG tablet Take 15 mg by mouth at bedtime.     Yes Historical Provider, MD  omeprazole (PRILOSEC) 10 MG capsule Take 10 mg by  mouth every morning.   Yes Historical Provider, MD  oxymetazoline (NASAL SPRAY 12 HOUR) 0.05 % nasal spray Place 1-2 sprays into the nose daily as needed for congestion.   Yes Historical Provider, MD  PARoxetine (PAXIL) 10 MG tablet Take 10 mg by mouth every morning.   Yes Historical Provider, MD  traMADol (ULTRAM) 50 MG tablet Take 50 mg by mouth daily as needed for pain.   Yes Historical Provider, MD  trimethoprim (TRIMPEX) 100 MG tablet Take 100 mg by mouth at bedtime. 01/08/13  Yes  Historical Provider, MD   Physical Exam: Filed Vitals:   05/06/13 1225 05/06/13 1230 05/06/13 1300 05/06/13 1330  BP: 152/63 151/67 154/67 137/84  Pulse: 83 81 81 85  Temp:      TempSrc:      Resp: 19 23 23 23   SpO2: 99% 98% 99% 98%     General:  She is confused, unable to communicate with her whatsoever. She does not look toxic or septic.  Eyes: No pallor. No jaundice.  ENT: No abnormalities.  Neck: No lymphadenopathy.  Cardiovascular: Heart sounds present without murmurs or added sounds.  Respiratory: Lung fields are clear.  Abdomen: Abdomen is soft and nontender.  Skin: No rash.  Musculoskeletal: Evidence of fractured left ankle which happened approximately 3 weeks ago. It is splinted.  Psychiatric: Not examined.  Neurologic: Appears to move all her limbs but unable to test more specifically because of her altered mental status. There is no meningism.  Labs on Admission:  Basic Metabolic Panel:  Recent Labs Lab 05/04/13 0931 05/06/13 1133  NA 129* 130*  K 4.9 4.6  CL 96 98  CO2 25 23  GLUCOSE 109* 117*  BUN 17 20  CREATININE 1.15* 1.25*  CALCIUM 9.3 9.3   Liver Function Tests:  Recent Labs Lab 05/04/13 0931  AST 8  ALT <5  ALKPHOS 128*  BILITOT 0.2*  PROT 7.1  ALBUMIN 3.5     CBC:  Recent Labs Lab 05/04/13 0931 05/06/13 1133  WBC 6.7 6.4  NEUTROABS 5.1 4.5  HGB 11.3* 10.9*  HCT 33.1* 32.5*  MCV 95.9 96.2  PLT 255 259        CBG:  Recent Labs Lab 05/06/13 1111  GLUCAP 105*    Radiological Exams on Admission: Dg Chest Portable 1 View  05/06/2013   *RADIOLOGY REPORT*  Clinical Data: Altered mental status.  PORTABLE CHEST - 1 VIEW  Comparison: 01/11/2013  Findings: Lung volumes are low bilaterally.  No overt edema or infiltrate is seen.  Heart size is stable and within normal limits. No pleural fluid is identified.  IMPRESSION: Low lung volumes.  No active disease.   Original Report Authenticated By: Irish Lack, M.D.       Assessment/Plan Active Problems:   Altered mental state   Lateral malleolar fracture   UTI (urinary tract infection)   Parkinson disease   1. Altered mental status, likely due to UTI. Other causes would be CVA. 2. Parkinson's disease, stable. 3. Dehydration.  Plan: 1. Admit to medical floor. 2. Intravenous fluids and intravenous antibiotics. 3. MRI brain scan. Further recommendations will depend on patient's hospital progress.  Code Status: DO NOT RESUSCITATE. This was confirmed by the daughter at the bedside, she is the health care power of attorney.   Family Communication: Discussed plan with patient's daughter at the bedside.   Disposition Plan: Home when medically stable.   Time spent: 45 minutes.  Wilson Singer [facility] Pager [phone].  If  7PM-7AM, please contact night-coverage [email or web address] Password [facility] 05/06/2013, 2:14 PM

## 2013-05-06 NOTE — ED Notes (Signed)
Nurse unavailable for report.  

## 2013-05-06 NOTE — ED Provider Notes (Signed)
History    This chart was scribed for Joya Gaskins, MD by Leone Payor, ED Scribe. This patient was seen in room APA02/APA02 and the patient's care was started 11:35 AM.  CSN: [id number] Arrival date & time 05/06/13  1102  First MD Initiated Contact with Patient 05/06/13 1109     Chief Complaint  Patient presents with  . Altered Mental Status  . Urinary Tract Infection    Patient is a 79 y.o. female presenting with altered mental status and urinary tract infection. The history is provided by the patient. No language interpreter was used.  Altered Mental Status Presenting symptoms: confusion   Severity:  Moderate Most recent episode:  Today Episode history:  Multiple Duration:  2 days Timing:  Constant Progression:  Worsening Chronicity:  New Context: recent infection   Urinary Tract Infection    HPI Comments: [name] is a 79 y.o. female who presents to the Emergency Department complaining of altered mental status starting 2 days ago. Pt lives at home with 24 hour home care. She was seen here on Friday for AMS as well and was found to have a UTI. She was given antibiotics. Home care giver states pt has been feeling increased confusion since then and has been having decreased sleep in the last 3-4 days. She is also experiencing hallucinations and her speech has been off recently. She also has night sweats, hip and back pain which she has at baseline.   Past Medical History  Diagnosis Date  . Hypertension   . GERD (gastroesophageal reflux disease)   . Parkinson disease   . Depression   . Pulmonary embolism   . Diabetes mellitus   . Arthritis   . Hypercalcemia    Past Surgical History  Procedure Laterality Date  . Esophagogastroduodenoscopy  06/10/2011    Procedure: ESOPHAGOGASTRODUODENOSCOPY (EGD);  Surgeon: Malissa Hippo, MD;  [facility];  Service: Endoscopy;  Laterality: N/A;  . Vena cava filter placement    . Esophagogastroduodenoscopy   07/29/2011    Procedure: ESOPHAGOGASTRODUODENOSCOPY (EGD);  Surgeon: Malissa Hippo, MD;  [facility];  Service: Endoscopy;  Laterality: N/A;  . Laparoscopic nissen fundoplication  08/09/2011    Procedure: LAPAROSCOPIC NISSEN FUNDOPLICATION;  Surgeon: Fabio Bering;  Location: [facility];  Service: General;  Laterality: N/A;  Laparoscopic Hiatal Hernia Repair  . Gastrostomy  08/09/2011    Procedure: GASTROSTOMY;  Surgeon: Fabio Bering;  Location: [facility];  Service: General;  Laterality: Left;  Laparoscopic Gastrostomy Tube placement   No family history on file. History  Substance Use Topics  . Smoking status: Never Smoker   . Smokeless tobacco: Not on file  . Alcohol Use: No   OB History   Grav Para Term Preterm Abortions TAB SAB Ect Mult Living                 Review of Systems  Unable to perform ROS: Mental status change  Psychiatric/Behavioral: Positive for confusion and altered mental status.    Allergies  Metoclopramide hcl  Home Medications   Current Outpatient Rx  Name  Route  Sig  Dispense  Refill  . acetaminophen (TYLENOL) 325 MG tablet   Oral   Take 650 mg by mouth 2 (two) times daily. Also given as needed for mild pain in addition to twice daily         . amitriptyline (ELAVIL) 10 MG tablet   Oral   Take 10 mg by  mouth at bedtime.           . carbidopa-levodopa (SINEMET) 25-100 MG per tablet   Oral   Take 2 tablets by mouth 3 (three) times daily.           . cephALEXin (KEFLEX) 500 MG capsule   Oral   Take 1 capsule (500 mg total) by mouth 4 (four) times daily.   28 capsule   0   . Glycerin-Hypromellose-PEG 400 (SM DRY EYE RELIEF OP)   Ophthalmic   Apply 1 drop to eye daily as needed (for dry eye relief).         . mirtazapine (REMERON) 15 MG tablet   Oral   Take 15 mg by mouth at bedtime.           Marland Kitchen omeprazole (PRILOSEC) 10 MG capsule   Oral   Take 10 mg by mouth every morning.         Marland Kitchen oxymetazoline (NASAL SPRAY 12  HOUR) 0.05 % nasal spray   Nasal   Place 1-2 sprays into the nose daily as needed for congestion.         Marland Kitchen PARoxetine (PAXIL) 10 MG tablet   Oral   Take 10 mg by mouth every morning.         . traMADol (ULTRAM) 50 MG tablet   Oral   Take 50 mg by mouth daily as needed for pain.         Marland Kitchen trimethoprim (TRIMPEX) 100 MG tablet   Oral   Take 100 mg by mouth at bedtime.          BP 141/67  Pulse 79  Temp(Src) 98.8 F (37.1 C) (Rectal)  Resp 16  SpO2 98% Physical Exam CONSTITUTIONAL: elderly and frail. intermitent upper body jerkin noted HEAD: Normocephalic/atraumatic EYES: EOMI/PERRL ENMT: Mucous membranes dry.  NECK: supple no meningeal signs SPINE:entire spine nontender CV: S1/S2 noted, no murmurs/rubs/gallops noted LUNGS: decreased breath sounds bilaterally.  ABDOMEN: soft, nontender, no rebound or guarding GU:no cva tenderness NEURO: Pt is drowsy but easily arousable, moves all extremitiesx4. No facial droop. EXTREMITIES: pulses normal, full ROM. Air cast to left LE (from previous fracture, placed by her orthopedist SKIN: warm, color normal PSYCH: no abnormalities of mood noted  ED Course  Procedures (including critical care time)  DIAGNOSTIC STUDIES: Oxygen Saturation is 98% on RA, normal by my interpretation.    COORDINATION OF CARE: 11:45 AM Discussed treatment plan with pt at bedside and pt agreed to plan.    Labs Reviewed  GLUCOSE, CAPILLARY - Abnormal; Notable for the following:    Glucose-Capillary 105 (*)    All other components within normal limits  URINALYSIS, ROUTINE W REFLEX MICROSCOPIC  CBC WITH DIFFERENTIAL  BASIC METABOLIC PANEL  LACTIC ACID, PLASMA   1:51 PM Pt continued confusion despite home therapies.  Will admit for observation for dehydration/uti/delirium Family reports patient is a DNR   MDM  Nursing notes including past medical history and social history reviewed and considered in documentation Labs/vital reviewed and  considered xrays reviewed and considered Previous records reviewed and considered      Date: 05/06/2013 1109am  Rate: 78  Rhythm: normal sinus rhythm  QRS Axis: normal  Intervals: normal  ST/T Wave abnormalities: nonspecific ST changes  Conduction Disutrbances:none  Narrative Interpretation:   Old EKG Reviewed: unchanged from 12/2012    I personally performed the services described in this documentation, which was scribed in my presence. The recorded information has been reviewed  and is accurate.        Joya Gaskins, MD 05/06/13 1352

## 2013-05-06 NOTE — ED Notes (Signed)
EMP reports pt was diagnosed with UTI on Friday and given antibiotics.  Reports increased confusion.  Also reports hematuria.

## 2013-05-07 ENCOUNTER — Inpatient Hospital Stay (HOSPITAL_COMMUNITY): Payer: Medicare Other

## 2013-05-07 LAB — CBC
HCT: 32 % — ABNORMAL LOW (ref 36.0–46.0)
Hemoglobin: 10.6 g/dL — ABNORMAL LOW (ref 12.0–15.0)
RDW: 13.8 % (ref 11.5–15.5)
WBC: 5.5 10*3/uL (ref 4.0–10.5)

## 2013-05-07 LAB — COMPREHENSIVE METABOLIC PANEL
BUN: 16 mg/dL (ref 6–23)
CO2: 23 mEq/L (ref 19–32)
Calcium: 8.9 mg/dL (ref 8.4–10.5)
GFR calc Af Amer: 57 mL/min — ABNORMAL LOW (ref >90)
GFR calc non Af Amer: 49 mL/min — ABNORMAL LOW (ref >90)
Glucose, Bld: 84 mg/dL (ref 70–99)
Total Protein: 6.4 g/dL (ref 6.0–8.3)

## 2013-05-07 LAB — URINE CULTURE

## 2013-05-07 LAB — MRSA PCR SCREENING: MRSA by PCR: NEGATIVE

## 2013-05-07 NOTE — Care Management Note (Signed)
    Page 1 of 1   05/09/2013     1:28:24 PM   CARE MANAGEMENT NOTE 05/09/2013  Patient:  [name], [name]   Account Number:  [id number]  Date Initiated:  05/07/2013  Documentation initiated by:  Rosemary Holms  Subjective/Objective Assessment:   Pt admitted from home alone. Has a 24 hr caregiver. Daughter is POA. Has DME . May need [facility] RN or PT.     Action/Plan:   Anticipated DC Date:  05/08/2013   Anticipated DC Plan:  HOME W HOME HEALTH SERVICES      DC Planning Services  CM consult      Choice offered to / List presented to:             Status of service:  Completed, signed off Medicare Important Message given?   (If response is "NO", the following Medicare IM given date fields will be blank) Date Medicare IM given:   Date Additional Medicare IM given:    Discharge Disposition:  HOME/SELF CARE  Per UR Regulation:    If discussed at Long Length of Stay Meetings, dates discussed:    Comments:  05/07/13 Rosemary Holms RN BSN CM

## 2013-05-07 NOTE — Progress Notes (Signed)
Utilization Review Complete  

## 2013-05-08 LAB — URINE CULTURE

## 2013-05-08 MED ORDER — CEFTRIAXONE SODIUM 1 G IJ SOLR
1.0000 g | Freq: Once | INTRAMUSCULAR | Status: AC
  Administered 2013-05-08: 1 g via INTRAMUSCULAR
  Filled 2013-05-08: qty 10

## 2013-05-08 NOTE — Progress Notes (Signed)
Pt's IV infiltrated.  Pt received only a small amount of Rocephin.  Dr. [name] notified.  Received telephone order to give patient 1 dose of Rocephin 1 gram IM and to leave IV out for potential discharge.  Orders followed.

## 2013-05-08 NOTE — Plan of Care (Signed)
Problem: Discharge Progression Outcomes Goal: Tolerating diet Outcome: Progressing Pt's diet advanced to regular diet.  Did not want spaghetti for lunch.  Only wanted to eat the jello.

## 2013-05-08 NOTE — Progress Notes (Signed)
Culture and sensitivity report results called to Dr. [name].  Continue current antibiotic.  Possible discharge this evening.

## 2013-05-09 MED ORDER — LORAZEPAM 0.5 MG PO TABS: 0.5000 mg | ORAL_TABLET | Freq: Four times a day (QID) | ORAL | Status: AC | PRN

## 2013-05-09 MED ORDER — CEPHALEXIN 500 MG PO CAPS: 500.0000 mg | ORAL_CAPSULE | Freq: Three times a day (TID) | ORAL | Status: DC

## 2013-05-09 NOTE — Discharge Summary (Signed)
[name], PORTS                 ACCOUNT NO.:  [id number]  MEDICAL RECORD NO.:  [id number]  LOCATION:  A325                          FACILITY:  [facility]  PHYSICIAN:  Kingsley Callander. [name], MD       DATE OF BIRTH:  03-03-1923  DATE OF ADMISSION:  05/06/2013 DATE OF DISCHARGE:  07/16/2014LH                              DISCHARGE SUMMARY   DISCHARGE DIAGNOSES: 1. Escherichia coli, urinary tract infection. 2. Altered mental status. 3. Parkinson disease. 4. Hyponatremia. 5. Anxiety and depression. 6. Pulmonary embolism. 7. History of diabetes.  PROCEDURES:  CT scan of the head, MRI of the brain.  HOSPITAL COURSE:  This patient is an 79 year old female with Parkinson disease, who presented with altered mental status and increased confusion.  She had been in the emergency room on the May 04, 2013, and had been treated for UTI with cephalexin.  Her white count was 6.7 at that time.  When she re-presented, her white count was 6.4.  She had no fever.  Her lactic acid level was 1.3.  Her chest x-ray revealed no infiltrate.  She was hyponatremic with sodium level of 129 and 130.  Her urinalysis initially had revealed too numerous to count wbc's with many bacteria.  She was treated with ceftriaxone.  Her culture revealed E. coli, which was sensitive to ceftriaxone.  She developed no fever.  Her confusion improved.  Repeat white count was 5.5.  She had a trend down in her hemoglobin from 11.3-10.6, but had no evidence of bleeding.  She underwent CT scan of the brain, which revealed no acute infarct. She then had an MRI of the brain, which revealed no evidence of acute infarct.  Chronic small vessel changes were noted.  She has recently been recovering from a left ankle fracture.  This has been followed by Dr. Romeo Apple.  Her condition improved during her hospitalization.  It is felt that her altered mental status was related to her urinary tract infection and underlying Parkinson disease.  Her  antibiotic therapy is being modified to oral cephalexin at a dose of 500 mg t.i.d.  She has been followed by Urology  and has been on suppressive antibiotic therapy with trimethoprim.  CONDITION AT DISCHARGE:  Much improved.  DISCHARGE MEDICATIONS:  Cephalexin 500 mg t.i.d. for 1 week, acetaminophen 650 mg 2 times daily, and amitriptyline 10 mg at bedtime, Sinemet 25/100 two tablets t.i.d., and then SM dry eye relief 1 drop to the eye daily as needed.  Lorazepam 0.5 mg q.6 hours p.r.n., Remeron 15 mg at bedtime, Prilosec 10 mg q.a.m., paroxetine 10 mg q.a.m., tramadol 50 mg q.6 p.r.n., and trimethoprim 100 mg daily.     Kingsley Callander. [name], MD     ROF/MEDQ  D:  05/09/2013  T:  05/09/2013  Job:  [id number]

## 2013-05-09 NOTE — Progress Notes (Signed)
Pt is to be discharged home today. Pt is in NAD, IV is out, all paperwork has been reviewed/discussed with patient, and there are no questions/concerns at this time. Assessment is unchanged from this morning. Pt is to be accompanied downstairs by staff and family via wheelchair.  

## 2013-05-10 NOTE — Progress Notes (Signed)
NAMEMAAHI, LANNAN                 ACCOUNT NO.:  [id number]  MEDICAL RECORD NO.:  [id number]  LOCATION:                                 FACILITY:  PHYSICIAN:  Kingsley Callander. [name], MD       DATE OF BIRTH:  1923-10-07  DATE OF PROCEDURE:  05/08/2013 DATE OF DISCHARGE:  05/09/2013                                PROGRESS NOTE   SUBJECTIVE:  Ms. Mandrell has had no fever.  She continues to exhibit some confusion.  She is alert and is oriented to place and person at the time of my exam.  OBJECTIVE:  VITAL SIGNS:  Stable. CHEST:  Respirations are unlabored. LUNGS:  Clear. HEART:  Regular with no murmurs. ABDOMEN:  Soft and nontender. NEURO:  Stable.  IMPRESSION/PLAN: 1. Urinary tract infection.  Her urine culture reveals E coli which is     sensitive to Rocephin.  Continue Rocephin. 2. Parkinson disease.  Continue Sinemet. 3. Anxiety.  Continue p.r.n. lorazepam and paroxetine. 4. Ankle fracture, stable.     Kingsley Callander. [name], MD     ROF/MEDQ  D:  05/08/2013  T:  05/09/2013  Job:  [id number]

## 2013-05-11 ENCOUNTER — Ambulatory Visit: Payer: Self-pay | Admitting: Nurse Practitioner

## 2013-05-15 ENCOUNTER — Encounter: Payer: Self-pay | Admitting: Orthopedic Surgery

## 2013-05-15 ENCOUNTER — Ambulatory Visit (INDEPENDENT_AMBULATORY_CARE_PROVIDER_SITE_OTHER): Payer: Medicare Other | Admitting: Orthopedic Surgery

## 2013-05-15 VITALS — BP 118/74 | Ht 60.0 in | Wt 126.0 lb

## 2013-05-15 DIAGNOSIS — IMO0001 Reserved for inherently not codable concepts without codable children: Secondary | ICD-10-CM

## 2013-05-15 DIAGNOSIS — S8262XD Displaced fracture of lateral malleolus of left fibula, subsequent encounter for closed fracture with routine healing: Secondary | ICD-10-CM

## 2013-05-15 NOTE — Progress Notes (Signed)
Patient ID: [name], female   DOB: May 04, 1923, 79 y.o.   MRN: [id number] Left ankle fracture with 2 pressure sores follow up for wound check the patient in Aircast tolerating stand and pivot easily  Both wounds have healed the patient will continue in the Aircast and come back in 2 weeks for her four-week left ankle x-ray.

## 2013-05-31 ENCOUNTER — Ambulatory Visit (INDEPENDENT_AMBULATORY_CARE_PROVIDER_SITE_OTHER): Payer: Medicare Other | Admitting: Orthopedic Surgery

## 2013-05-31 ENCOUNTER — Ambulatory Visit (INDEPENDENT_AMBULATORY_CARE_PROVIDER_SITE_OTHER): Payer: Medicare Other

## 2013-05-31 VITALS — BP 124/72 | Ht 60.0 in | Wt 126.0 lb

## 2013-05-31 DIAGNOSIS — IMO0001 Reserved for inherently not codable concepts without codable children: Secondary | ICD-10-CM

## 2013-05-31 DIAGNOSIS — S82892D Other fracture of left lower leg, subsequent encounter for closed fracture with routine healing: Secondary | ICD-10-CM

## 2013-05-31 NOTE — Progress Notes (Signed)
Patient ID: [name], female   DOB: 04-May-1923, 79 y.o.   MRN: [id number] Chief Complaint  Patient presents with  . Follow-up    2 week recheck on left ankle with xray. DOI 04-21-13.   The patient had 2 ulcerations which have healed  She is tolerating the air cast well.  Radiology report 3 views left ankle left ankle fracture Segmental comminuted fracture left fibula slight angulation no displacement ankle mortise intact  Impression healing lateral malleolus fracture

## 2013-05-31 NOTE — Patient Instructions (Addendum)
Return in 6 weeks   Continue immobilization

## 2013-06-19 ENCOUNTER — Encounter: Payer: Self-pay | Admitting: Nurse Practitioner

## 2013-06-19 ENCOUNTER — Ambulatory Visit (INDEPENDENT_AMBULATORY_CARE_PROVIDER_SITE_OTHER): Payer: Medicare Other | Admitting: Nurse Practitioner

## 2013-06-19 VITALS — BP 116/68 | HR 76

## 2013-06-19 DIAGNOSIS — G20A1 Parkinson's disease without dyskinesia, without mention of fluctuations: Secondary | ICD-10-CM

## 2013-06-19 DIAGNOSIS — G20 Parkinson's disease: Secondary | ICD-10-CM

## 2013-06-19 DIAGNOSIS — R413 Other amnesia: Secondary | ICD-10-CM

## 2013-06-19 NOTE — Progress Notes (Signed)
Reason for visit followup for Parkinson's disease and memory loss HPI: 06/19/13.Ms Krabbenhoft returns with  her daughter at visit, she has home care 24x7, continues to take sinemet 25/100 2tabs tid, she only walk short distance at home now with assistance, need help on all daily activities., Memory loss is stable, tremor is stable. Does has some swallowing issues because daughter says she eats too fast. She is sleeping well at night. Daughter reports recent UTI. Pt has never seen urologist.Fell in June, wearing a soft brace to left ankle. Azilect was ordered at last visit and she took it for 2 months then stopped  the medication because it was not beneficial in her primary would no longer order her  antidepressants.      HISTORY: She gradually noticed a right-hand resting tremor and has felt her teeth was chattering over past 2 years as well as problems with walking and difficulty turning over in bed. She has had some falls but has fortunately not been injured. She denies loss of smell. She has vivid dreams but no known sleep disorder. She has some constipation.  She lives independently but her daughter checks on her often.  Her daughter prepares foods and brings to her. She has never driven.  She admits to some short -term memory loss. She has noted mild dyspnea on exertion in the past year. She has a walker and cane at home but her daughter tells me that she doesn't use them when she goes out. She is assisted by her family when she leaves her home. She had normal blood studies with the exception of a mildly decreased VItamin D level. She had an MRI which revealed diffuse atrophy, ventriculomegaly and mild subcortical white matter disease.  She was started on Sinemet 25/100 ii tid, and has noted improvement in her condition. She has tolerated this well without side effects.     ROS:  Fatigue, hearing loss, swallowing problems, shortness of breath, cough, frequent UTIs, easy bruising, joint pain, memory  loss, anxiety depression   Medications Current Outpatient Prescriptions on File Prior to Visit  Medication Sig Dispense Refill  . acetaminophen (TYLENOL) 325 MG tablet Take 650 mg by mouth 2 (two) times daily. Also given as needed for mild pain in addition to twice daily      . amitriptyline (ELAVIL) 10 MG tablet Take 10 mg by mouth at bedtime.        . carbidopa-levodopa (SINEMET) 25-100 MG per tablet Take 2 tablets by mouth 3 (three) times daily.        . cephALEXin (KEFLEX) 500 MG capsule Take 1 capsule (500 mg total) by mouth 3 (three) times daily.  28 capsule  0  . mirtazapine (REMERON) 15 MG tablet Take 15 mg by mouth at bedtime.        Marland Kitchen omeprazole (PRILOSEC) 10 MG capsule Take 10 mg by mouth every morning.      Marland Kitchen oxymetazoline (NASAL SPRAY 12 HOUR) 0.05 % nasal spray Place 1-2 sprays into the nose daily as needed for congestion.      Marland Kitchen PARoxetine (PAXIL) 10 MG tablet Take 10 mg by mouth every morning.      . traMADol (ULTRAM) 50 MG tablet Take 50 mg by mouth daily as needed for pain.      Marland Kitchen trimethoprim (TRIMPEX) 100 MG tablet Take 100 mg by mouth at bedtime.      Marland Kitchen LORazepam (ATIVAN) 0.5 MG tablet Take 1 tablet (0.5 mg total) by mouth every 6 (six)  hours as needed for anxiety.  30 tablet  3   No current facility-administered medications on file prior to visit.    Allergies  Allergies  Allergen Reactions  . Reglan [Metoclopramide] Other (See Comments)    Tremors really bad   . Metoclopramide Hcl Other (See Comments)    Affected Nervous system- severe jerkiing, shaking, etc..    Physical Exam General: well developed, well nourished, seated, in no evident distress Head: head normocephalic and atraumatic. Oropharynx benign Neck: supple with no carotid  bruits Cardiovascular: regular rate and rhythm, no murmurs  Neurologic Exam Mental Status: Awake and  alert. MMSE 17/30. AFT 5 Follows all commands. Speech and language normal.   Cranial Nerves:  Pupils equal, briskly  reactive to light. Extraocular movements full without nystagmus. Visual fields full to confrontation. Hearing decreased bil.Facial sensation intact. Face, tongue, palate move normally and symmetrically. Neck flexion and extension normal.  Motor: Normal bulk and tone. Normal strength in all tested extremity muscles.No focal weakness. No rigidity or cogwheeling Coordination: Rapid alternating movements normal in all extremities. Finger-to-nose  performed accurately bilaterally. No dysmetria or bradykinesia Gait and Station: In wheelchair not ambulated.  Reflexes: 2+ and symmetric except 1+ Achilles. Toes downgoing.     ASSESSMENT: Parkinson's disease in good control, mild memory loss which is stable     PLAN: Continue carbidopa levodopa at current dose does not need refills  Follow up in 6 months  Nilda Riggs, GNP-BC APRN

## 2013-06-19 NOTE — Patient Instructions (Addendum)
Continue carbidopa levodopa at current dose does not need refills  memory testing is stable Follow up in 6 months

## 2013-07-12 ENCOUNTER — Ambulatory Visit (INDEPENDENT_AMBULATORY_CARE_PROVIDER_SITE_OTHER): Payer: Self-pay | Admitting: Orthopedic Surgery

## 2013-07-12 ENCOUNTER — Ambulatory Visit (INDEPENDENT_AMBULATORY_CARE_PROVIDER_SITE_OTHER): Payer: Medicare Other

## 2013-07-12 ENCOUNTER — Encounter: Payer: Self-pay | Admitting: Orthopedic Surgery

## 2013-07-12 VITALS — BP 129/81 | Ht 60.0 in | Wt 126.0 lb

## 2013-07-12 DIAGNOSIS — S82892D Other fracture of left lower leg, subsequent encounter for closed fracture with routine healing: Secondary | ICD-10-CM

## 2013-07-12 DIAGNOSIS — S8262XD Displaced fracture of lateral malleolus of left fibula, subsequent encounter for closed fracture with routine healing: Secondary | ICD-10-CM

## 2013-07-12 DIAGNOSIS — IMO0001 Reserved for inherently not codable concepts without codable children: Secondary | ICD-10-CM

## 2013-07-12 NOTE — Progress Notes (Signed)
Patient ID: [name], female   DOB: 1923-02-08, 79 y.o.   MRN: [id number]  Chief Complaint  Patient presents with  . Follow-up    recheck left ankle fracture DOI 04/21/13    12 weeks post injury x-ray lateral malleolus fracture The x-ray shows complete healing of the segmental lateral malleolus fracture  Patient is discharged

## 2013-08-28 ENCOUNTER — Encounter (INDEPENDENT_AMBULATORY_CARE_PROVIDER_SITE_OTHER): Payer: Self-pay

## 2013-08-28 ENCOUNTER — Ambulatory Visit (INDEPENDENT_AMBULATORY_CARE_PROVIDER_SITE_OTHER): Payer: Medicare Other | Admitting: Urology

## 2013-08-28 DIAGNOSIS — N390 Urinary tract infection, site not specified: Secondary | ICD-10-CM

## 2013-08-28 DIAGNOSIS — N952 Postmenopausal atrophic vaginitis: Secondary | ICD-10-CM

## 2013-11-14 ENCOUNTER — Other Ambulatory Visit: Payer: Self-pay

## 2013-11-14 MED ORDER — CARBIDOPA-LEVODOPA 25-100 MG PO TABS: 2.0000 | ORAL_TABLET | Freq: Three times a day (TID) | ORAL | Status: DC

## 2013-12-20 ENCOUNTER — Ambulatory Visit: Payer: Medicare Other | Admitting: Nurse Practitioner

## 2013-12-21 ENCOUNTER — Ambulatory Visit: Payer: Medicare Other | Admitting: Nurse Practitioner

## 2014-01-11 ENCOUNTER — Other Ambulatory Visit: Payer: Self-pay

## 2014-01-11 MED ORDER — CARBIDOPA-LEVODOPA 25-100 MG PO TABS: 2.0000 | ORAL_TABLET | Freq: Three times a day (TID) | ORAL | Status: DC

## 2014-02-07 ENCOUNTER — Encounter: Payer: Self-pay | Admitting: Nurse Practitioner

## 2014-02-07 ENCOUNTER — Ambulatory Visit (INDEPENDENT_AMBULATORY_CARE_PROVIDER_SITE_OTHER): Payer: Medicare Other | Admitting: Nurse Practitioner

## 2014-02-07 ENCOUNTER — Encounter (INDEPENDENT_AMBULATORY_CARE_PROVIDER_SITE_OTHER): Payer: Self-pay

## 2014-02-07 VITALS — BP 110/72 | HR 79

## 2014-02-07 DIAGNOSIS — R413 Other amnesia: Secondary | ICD-10-CM

## 2014-02-07 DIAGNOSIS — G20 Parkinson's disease: Secondary | ICD-10-CM

## 2014-02-07 MED ORDER — CARBIDOPA-LEVODOPA 25-100 MG PO TABS: 2.0000 | ORAL_TABLET | Freq: Three times a day (TID) | ORAL | Status: DC

## 2014-02-07 NOTE — Progress Notes (Signed)
[facility]  PATIENT: Jacques EarthlyHallie Urbach DOB: 01/31/1923   REASON FOR VISIT: Followup for Parkinson's disease and memory loss   HISTORY OF PRESENT ILLNESS: Ms. Jennette Kettleeal, 80 year old female returns for followup with her daughter. She has a history of Parkinson's disease, her tremor is well controlled. She is seated in wheelchair and walks very little. She has a caregiver that stays with her 8 hours a day. She has had no overt falls. Appetite is reportedly good. She is very hard of hearing. She has tolerated Sinemet well without side effects. She has occasional hallucinations but these are not frightening. Her memory score is actually improved since last seen. She returns for reevaluation.  HISTORY: She gradually noticed a right-hand resting tremor and has felt her teeth was chattering over past 2 years as well as problems with walking and difficulty turning over in bed. She has had some falls but has fortunately not been injured. She denies loss of smell. She has vivid dreams but no known sleep disorder. She has some constipation.  She lives independently but her daughter checks on her often. Her daughter prepares foods and brings to her. She has never driven. She admits to some short -term memory loss. She has noted mild dyspnea on exertion in the past year. She has a walker and cane at home but her daughter tells me that she doesn't use them when she goes out. She is assisted by her family when she leaves her home.  She had normal blood studies with the exception of a mildly decreased VItamin D level. She had an MRI which revealed diffuse atrophy, ventriculomegaly and mild subcortical white matter disease.  She was started on Sinemet 25/100 ii tid, and has noted improvement in her condition. She has tolerated this well without side effects.      REVIEW OF SYSTEMS: Full 14 system review of systems performed and notable only for those listed, all others are neg:  Constitutional: N/A    Cardiovascular: N/A  Ear/Nose/Throat:  hearing loss,  Trouble swallowing Skin: N/A  Eyes: N/A  Respiratory: N/A  Gastroitestinal: Urinary incontinence Hematology/Lymphatic: N/A  Endocrine: N/A Musculoskeletal: Joint pain Allergy/Immunology: N/A  Neurological: Memory loss, weakness Psychiatric: Depression anxiety   ALLERGIES: Allergies  Allergen Reactions  . Reglan [Metoclopramide] Other (See Comments)    Tremors really bad   . Metoclopramide Hcl Other (See Comments)    Affected Nervous system- severe jerkiing, shaking, etc..    HOME MEDICATIONS: Outpatient Prescriptions Prior to Visit  Medication Sig Dispense Refill  . acetaminophen (TYLENOL) 325 MG tablet Take 650 mg by mouth 2 (two) times daily. Also given as needed for mild pain in addition to twice daily      . amitriptyline (ELAVIL) 10 MG tablet Take 10 mg by mouth at bedtime.        . carbidopa-levodopa (SINEMET IR) 25-100 MG per tablet Take 2 tablets by mouth 3 (three) times daily. Before meals  180 tablet  0  . LORazepam (ATIVAN) 0.5 MG tablet Take 1 tablet (0.5 mg total) by mouth every 6 (six) hours as needed for anxiety.  30 tablet  3  . mirtazapine (REMERON) 15 MG tablet Take 15 mg by mouth at bedtime.        Marland Kitchen. omeprazole (PRILOSEC) 10 MG capsule Take 10 mg by mouth every morning.      Marland Kitchen. oxymetazoline (NASAL SPRAY 12 HOUR) 0.05 % nasal spray Place 1-2 sprays into the nose daily as needed for congestion.      .Marland Kitchen  PARoxetine (PAXIL) 10 MG tablet Take 10 mg by mouth every morning.      . traMADol (ULTRAM) 50 MG tablet Take 50 mg by mouth daily as needed for pain.      Marland Kitchen trimethoprim (TRIMPEX) 100 MG tablet Take 100 mg by mouth at bedtime.       No facility-administered medications prior to visit.    PAST MEDICAL HISTORY: Past Medical History  Diagnosis Date  . Hypertension   . GERD (gastroesophageal reflux disease)   . Parkinson disease   . Depression   . Pulmonary embolism   . Diabetes mellitus   . Arthritis    . Hypercalcemia     PAST SURGICAL HISTORY: Past Surgical History  Procedure Laterality Date  . Esophagogastroduodenoscopy  06/10/2011    Procedure: ESOPHAGOGASTRODUODENOSCOPY (EGD);  Surgeon: Malissa Hippo, MD;  Location: [facility];  Service: Endoscopy;  Laterality: N/A;  . Vena cava filter placement    . Esophagogastroduodenoscopy  07/29/2011    Procedure: ESOPHAGOGASTRODUODENOSCOPY (EGD);  Surgeon: Malissa Hippo, MD;  Location: [facility];  Service: Endoscopy;  Laterality: N/A;  . Laparoscopic nissen fundoplication  08/09/2011    Procedure: LAPAROSCOPIC NISSEN FUNDOPLICATION;  Surgeon: Fabio Bering;  Location: [facility];  Service: General;  Laterality: N/A;  Laparoscopic Hiatal Hernia Repair  . Gastrostomy  08/09/2011    Procedure: GASTROSTOMY;  Surgeon: Fabio Bering;  Location: [facility];  Service: General;  Laterality: Left;  Laparoscopic Gastrostomy Tube placement    FAMILY HISTORY: History reviewed. No pertinent family history.  SOCIAL HISTORY: History   Social History  . Marital Status: Widowed    Spouse Name: N/A    Number of Children: 1  . Years of Education: 5   Occupational History  . Not on file.   Social History Main Topics  . Smoking status: Never Smoker   . Smokeless tobacco: Never Used  . Alcohol Use: No  . Drug Use: No  . Sexual Activity: Not on file   Other Topics Concern  . Not on file   Social History Narrative   Patient is living at home with caregivers.    Patient is widowed.    Patient has 1 child.    Patient is retired.    Patient has a 5th grade education.            PHYSICAL EXAM  Filed Vitals:   02/07/14 1430  BP: 110/72  Pulse: 79   Cannot calculate BMI with a height equal to zero.  Generalized: Well developed, in no acute distress  Head: normocephalic and atraumatic,. Oropharynx benign  Neck: Supple, no carotid bruits  Cardiac: Regular rate rhythm, no murmur  Neurological examination   Mentation: Alert  MMSE  22/30. AFT 6. Follows all commands speech and language fluent  Cranial nerve II-XII:Pupils were equal round reactive to light extraocular movements were full, visual field were full on confrontational test. Facial sensation and strength were normal. Hard of hearing. Uvula tongue midline. head turning and shoulder shrug were normal and symmetric.Tongue protrusion into cheek strength was normal. Motor: normal bulk and tone, full strength in the BUE, BLE, fine finger movements normal, no pronator drift. No focal weakness, mild cogwheeling in wrists and elbows Coordination: finger-nose-finger,  no dysmetria Reflexes: Brachioradialis 2/2, biceps 2/2, triceps 2/2, patellar 2/2, Achilles 1/1, plantar responses were flexor bilaterally. Gait and Station: In wheelchair not ambulated DIAGNOSTIC DATA (LABS, IMAGING, TESTING) - I reviewed patient records, labs, notes, testing and imaging myself where  available.  Lab Results  Component Value Date   WBC 5.5 05/07/2013   HGB 10.6* 05/07/2013   HCT 32.0* 05/07/2013   MCV 97.0 05/07/2013   PLT 244 05/07/2013      Component Value Date/Time   NA 137 05/07/2013 0516   K 4.5 05/07/2013 0516   CL 107 05/07/2013 0516   CO2 23 05/07/2013 0516   GLUCOSE 84 05/07/2013 0516   BUN 16 05/07/2013 0516   CREATININE 1.00 05/07/2013 0516   CALCIUM 8.9 05/07/2013 0516   PROT 6.4 05/07/2013 0516   ALBUMIN 3.1* 05/07/2013 0516   AST 10 05/07/2013 0516   ALT <5 05/07/2013 0516   ALKPHOS 117 05/07/2013 0516   BILITOT 0.2* 05/07/2013 0516   GFRNONAA 49* 05/07/2013 0516   GFRAA 57* 05/07/2013 0516       ASSESSMENT AND PLAN  80 y.o. year old female  has a past medical history of Parkinson's disease in good control, mild memory loss which is stable  Continue carbidopa levodopa at current dose will refill Memory score is stable at 22/30 Followup in 6 months, next appointment with Dr. Carmelina NounYan Nancy Carolyn Martin, [facility] CenterGNP, [facility] HospitalBC, APRN  [facility] [address], [address] [place], [place] [address] [phone]

## 2014-02-07 NOTE — Patient Instructions (Signed)
Continue carbidopa levodopa at current dose will refill Memory score is stable at 22/30 Followup in 6 months, next appointment with Dr. Terrace ArabiaYan

## 2014-06-15 ENCOUNTER — Emergency Department (HOSPITAL_COMMUNITY): Payer: Medicare Other

## 2014-06-15 ENCOUNTER — Inpatient Hospital Stay (HOSPITAL_COMMUNITY)
Admission: EM | Admit: 2014-06-15 | Discharge: 2014-06-19 | DRG: 690 | Disposition: A | Discharge status: Home Health | Payer: Medicare Other | Attending: Internal Medicine | Admitting: Internal Medicine

## 2014-06-15 ENCOUNTER — Encounter (HOSPITAL_COMMUNITY): Payer: Self-pay | Admitting: Emergency Medicine

## 2014-06-15 DIAGNOSIS — G20 Parkinson's disease: Secondary | ICD-10-CM | POA: Diagnosis present

## 2014-06-15 DIAGNOSIS — M25519 Pain in unspecified shoulder: Secondary | ICD-10-CM | POA: Diagnosis present

## 2014-06-15 DIAGNOSIS — E119 Type 2 diabetes mellitus without complications: Secondary | ICD-10-CM | POA: Diagnosis present

## 2014-06-15 DIAGNOSIS — M129 Arthropathy, unspecified: Secondary | ICD-10-CM | POA: Diagnosis present

## 2014-06-15 DIAGNOSIS — M542 Cervicalgia: Secondary | ICD-10-CM | POA: Diagnosis present

## 2014-06-15 DIAGNOSIS — I10 Essential (primary) hypertension: Secondary | ICD-10-CM | POA: Diagnosis present

## 2014-06-15 DIAGNOSIS — R5383 Other fatigue: Secondary | ICD-10-CM | POA: Diagnosis present

## 2014-06-15 DIAGNOSIS — N390 Urinary tract infection, site not specified: Secondary | ICD-10-CM | POA: Diagnosis present

## 2014-06-15 DIAGNOSIS — Z66 Do not resuscitate: Secondary | ICD-10-CM | POA: Diagnosis present

## 2014-06-15 DIAGNOSIS — E860 Dehydration: Secondary | ICD-10-CM

## 2014-06-15 DIAGNOSIS — E872 Acidosis, unspecified: Secondary | ICD-10-CM | POA: Diagnosis present

## 2014-06-15 DIAGNOSIS — E861 Hypovolemia: Secondary | ICD-10-CM | POA: Diagnosis present

## 2014-06-15 DIAGNOSIS — E871 Hypo-osmolality and hyponatremia: Secondary | ICD-10-CM | POA: Diagnosis present

## 2014-06-15 DIAGNOSIS — Z86711 Personal history of pulmonary embolism: Secondary | ICD-10-CM

## 2014-06-15 DIAGNOSIS — Z23 Encounter for immunization: Secondary | ICD-10-CM

## 2014-06-15 DIAGNOSIS — D649 Anemia, unspecified: Secondary | ICD-10-CM | POA: Diagnosis present

## 2014-06-15 DIAGNOSIS — N179 Acute kidney failure, unspecified: Secondary | ICD-10-CM | POA: Diagnosis present

## 2014-06-15 DIAGNOSIS — W19XXXA Unspecified fall, initial encounter: Secondary | ICD-10-CM | POA: Diagnosis present

## 2014-06-15 DIAGNOSIS — Z87440 Personal history of urinary (tract) infections: Secondary | ICD-10-CM | POA: Diagnosis not present

## 2014-06-15 DIAGNOSIS — K219 Gastro-esophageal reflux disease without esophagitis: Secondary | ICD-10-CM | POA: Diagnosis present

## 2014-06-15 DIAGNOSIS — R5381 Other malaise: Secondary | ICD-10-CM | POA: Diagnosis not present

## 2014-06-15 DIAGNOSIS — N289 Disorder of kidney and ureter, unspecified: Secondary | ICD-10-CM

## 2014-06-15 DIAGNOSIS — G20A1 Parkinson's disease without dyskinesia, without mention of fluctuations: Secondary | ICD-10-CM | POA: Diagnosis present

## 2014-06-15 LAB — CBC WITH DIFFERENTIAL/PLATELET
BASOS PCT: 0 % (ref 0–1)
Basophils Absolute: 0 10*3/uL (ref 0.0–0.1)
EOS PCT: 0 % (ref 0–5)
Eosinophils Absolute: 0 10*3/uL (ref 0.0–0.7)
HCT: 32 % — ABNORMAL LOW (ref 36.0–46.0)
HEMOGLOBIN: 10.4 g/dL — AB (ref 12.0–15.0)
LYMPHS ABS: 0.5 10*3/uL — AB (ref 0.7–4.0)
Lymphocytes Relative: 4 % — ABNORMAL LOW (ref 12–46)
MCH: 32.3 pg (ref 26.0–34.0)
MCHC: 32.5 g/dL (ref 30.0–36.0)
MCV: 99.4 fL (ref 78.0–100.0)
MONO ABS: 1.1 10*3/uL — AB (ref 0.1–1.0)
Monocytes Relative: 9 % (ref 3–12)
Neutro Abs: 10.1 10*3/uL — ABNORMAL HIGH (ref 1.7–7.7)
Neutrophils Relative %: 87 % — ABNORMAL HIGH (ref 43–77)
PLATELETS: 193 10*3/uL (ref 150–400)
RBC: 3.22 MIL/uL — AB (ref 3.87–5.11)
RDW: 14.5 % (ref 11.5–15.5)
WBC: 11.7 10*3/uL — ABNORMAL HIGH (ref 4.0–10.5)

## 2014-06-15 LAB — COMPREHENSIVE METABOLIC PANEL
ALBUMIN: 3.5 g/dL (ref 3.5–5.2)
ALK PHOS: 121 U/L — AB (ref 39–117)
ANION GAP: 17 — AB (ref 5–15)
AST: 10 U/L (ref 0–37)
BUN: 27 mg/dL — AB (ref 6–23)
CO2: 17 mEq/L — ABNORMAL LOW (ref 19–32)
Calcium: 9 mg/dL (ref 8.4–10.5)
Chloride: 96 mEq/L (ref 96–112)
Creatinine, Ser: 2.01 mg/dL — ABNORMAL HIGH (ref 0.50–1.10)
GFR calc Af Amer: 24 mL/min — ABNORMAL LOW (ref >90)
GFR calc non Af Amer: 21 mL/min — ABNORMAL LOW (ref >90)
Glucose, Bld: 100 mg/dL — ABNORMAL HIGH (ref 70–99)
POTASSIUM: 5.2 meq/L (ref 3.7–5.3)
Sodium: 130 mEq/L — ABNORMAL LOW (ref 137–147)
TOTAL PROTEIN: 7.5 g/dL (ref 6.0–8.3)
Total Bilirubin: 0.2 mg/dL — ABNORMAL LOW (ref 0.3–1.2)

## 2014-06-15 LAB — URINALYSIS, ROUTINE W REFLEX MICROSCOPIC
BILIRUBIN URINE: NEGATIVE
Glucose, UA: NEGATIVE mg/dL
Ketones, ur: NEGATIVE mg/dL
Nitrite: NEGATIVE
Protein, ur: 30 mg/dL — AB
SPECIFIC GRAVITY, URINE: 1.015 (ref 1.005–1.030)
UROBILINOGEN UA: 0.2 mg/dL (ref 0.0–1.0)
pH: 6 (ref 5.0–8.0)

## 2014-06-15 LAB — TROPONIN I: Troponin I: <0.3 ng/mL (ref <0.30)

## 2014-06-15 LAB — URINE MICROSCOPIC-ADD ON

## 2014-06-15 MED ORDER — ALUM & MAG HYDROXIDE-SIMETH 200-200-20 MG/5ML PO SUSP: 30.0000 mL | Freq: Four times a day (QID) | ORAL | Status: DC | PRN

## 2014-06-15 MED ORDER — HEPARIN SODIUM (PORCINE) 5000 UNIT/ML IJ SOLN
5000.0000 [IU] | Freq: Three times a day (TID) | INTRAMUSCULAR | Status: DC
  Administered 2014-06-15 – 2014-06-19 (×13): 5000 [IU] via SUBCUTANEOUS
  Filled 2014-06-15 (×13): qty 1

## 2014-06-15 MED ORDER — ACETAMINOPHEN 650 MG RE SUPP: 650.0000 mg | Freq: Four times a day (QID) | RECTAL | Status: DC | PRN

## 2014-06-15 MED ORDER — SODIUM CHLORIDE 0.9 % IV BOLUS (SEPSIS)
500.0000 mL | Freq: Once | INTRAVENOUS | Status: AC
  Administered 2014-06-15: 500 mL via INTRAVENOUS

## 2014-06-15 MED ORDER — TRAMADOL HCL 50 MG PO TABS
25.0000 mg | ORAL_TABLET | Freq: Every day | ORAL | Status: DC | PRN
  Administered 2014-06-15: 25 mg via ORAL
  Filled 2014-06-15: qty 1

## 2014-06-15 MED ORDER — ZOLPIDEM TARTRATE 5 MG PO TABS
5.0000 mg | ORAL_TABLET | Freq: Every evening | ORAL | Status: DC | PRN
  Administered 2014-06-15: 5 mg via ORAL
  Filled 2014-06-15: qty 1

## 2014-06-15 MED ORDER — PANTOPRAZOLE SODIUM 40 MG PO TBEC
40.0000 mg | DELAYED_RELEASE_TABLET | Freq: Every day | ORAL | Status: DC
  Administered 2014-06-17 – 2014-06-19 (×3): 40 mg via ORAL
  Filled 2014-06-15 (×4): qty 1

## 2014-06-15 MED ORDER — AMITRIPTYLINE HCL 10 MG PO TABS: 10.0000 mg | ORAL_TABLET | Freq: Every evening | ORAL | Status: DC | PRN

## 2014-06-15 MED ORDER — LORAZEPAM 0.5 MG PO TABS
0.5000 mg | ORAL_TABLET | Freq: Four times a day (QID) | ORAL | Status: DC | PRN
  Administered 2014-06-15: 0.5 mg via ORAL
  Filled 2014-06-15: qty 1

## 2014-06-15 MED ORDER — GUAIFENESIN-DM 100-10 MG/5ML PO SYRP: 5.0000 mL | ORAL_SOLUTION | ORAL | Status: DC | PRN

## 2014-06-15 MED ORDER — SODIUM CHLORIDE 0.9 % IV SOLN: INTRAVENOUS | Status: DC

## 2014-06-15 MED ORDER — LORAZEPAM 2 MG/ML IJ SOLN
0.5000 mg | Freq: Four times a day (QID) | INTRAMUSCULAR | Status: DC | PRN
  Administered 2014-06-16: 0.5 mg via INTRAVENOUS
  Filled 2014-06-15: qty 1

## 2014-06-15 MED ORDER — MIRTAZAPINE 30 MG PO TABS
15.0000 mg | ORAL_TABLET | Freq: Every day | ORAL | Status: DC
  Administered 2014-06-15 – 2014-06-18 (×4): 15 mg via ORAL
  Filled 2014-06-15 (×5): qty 1

## 2014-06-15 MED ORDER — ONDANSETRON HCL 4 MG/2ML IJ SOLN: 4.0000 mg | Freq: Four times a day (QID) | INTRAMUSCULAR | Status: DC | PRN

## 2014-06-15 MED ORDER — ALBUTEROL SULFATE (2.5 MG/3ML) 0.083% IN NEBU
2.5000 mg | INHALATION_SOLUTION | RESPIRATORY_TRACT | Status: DC | PRN
Start: 2014-06-15 — End: 2014-06-19

## 2014-06-15 MED ORDER — PAROXETINE HCL 20 MG PO TABS
10.0000 mg | ORAL_TABLET | Freq: Every morning | ORAL | Status: DC
  Administered 2014-06-17 – 2014-06-19 (×3): 10 mg via ORAL
  Filled 2014-06-15 (×4): qty 1

## 2014-06-15 MED ORDER — ACETAMINOPHEN 325 MG PO TABS
650.0000 mg | ORAL_TABLET | Freq: Four times a day (QID) | ORAL | Status: DC | PRN
  Administered 2014-06-15: 650 mg via ORAL
  Filled 2014-06-15: qty 2

## 2014-06-15 MED ORDER — DEXTROSE 5 % IV SOLN
1.0000 g | INTRAVENOUS | Status: DC
  Administered 2014-06-16 – 2014-06-19 (×4): 1 g via INTRAVENOUS
  Filled 2014-06-15 (×5): qty 10

## 2014-06-15 MED ORDER — SODIUM CHLORIDE 0.9 % IV SOLN
INTRAVENOUS | Status: DC
  Administered 2014-06-15 – 2014-06-19 (×9): via INTRAVENOUS

## 2014-06-15 MED ORDER — AMITRIPTYLINE HCL 10 MG PO TABS: 10.0000 mg | ORAL_TABLET | Freq: Every day | ORAL | Status: DC

## 2014-06-15 MED ORDER — ONDANSETRON HCL 4 MG PO TABS: 4.0000 mg | ORAL_TABLET | Freq: Four times a day (QID) | ORAL | Status: DC | PRN

## 2014-06-15 MED ORDER — DEXTROSE 5 % IV SOLN
1.0000 g | Freq: Once | INTRAVENOUS | Status: AC
  Administered 2014-06-15: 1 g via INTRAVENOUS
  Filled 2014-06-15: qty 10

## 2014-06-15 MED ORDER — CARBIDOPA-LEVODOPA 25-100 MG PO TABS
2.0000 | ORAL_TABLET | Freq: Three times a day (TID) | ORAL | Status: DC
  Administered 2014-06-15 – 2014-06-19 (×9): 2 via ORAL
  Filled 2014-06-15 (×10): qty 2

## 2014-06-15 MED ORDER — OXYMETAZOLINE HCL 0.05 % NA SOLN: 1.0000 | Freq: Every day | NASAL | Status: DC | PRN

## 2014-06-15 NOTE — H&P (Signed)
[facility] History and Physical  [name] NFA:[id number] DOB: [birth year] DOA: 06/15/2014  Referring physician: ED physician, Dr. Rubin [name] PCP: Amy Perches, MD   Chief Complaint: Generalized weakness.  HPI: [name] is a 80 y.o. female with a history of chronic urinary tract infections-on suppressive therapy with trimethoprim, Parkinson's disease, and pulmonary embolism-status post IVC filter, who presents with a complaint of generalized weakness. The history is being provided by the patient and the patient's daughter [name]. Accordingly, the patient has had generalized weakness for the past 3 days. [name] has noticed that the patient's urine had become dark and malodorous approximately one week ago. The patient also had more confusion than usual. She also had intermittent jerking of her arms and legs. All of these symptoms, as explained by [name], usually occur when the patient develops an active urinary tract infection. [name] increased the dose of trimethoprim and tried a few doses of Keflex which she has on hand for active infections. The patient did not appear to be improve. She denies abdominal pain, pain with urination, nausea, vomiting, or active diarrhea. She did have loose stools one week ago which were attributed to the increase in antibiotic dose.  In the emergency department, she was afebrile and hemodynamically stable. Her urinalysis revealed large leukocytes, too numerous to count WBCs, too numerous to count RBCs, and many bacteria. Her chest x-ray revealed no acute cardiopulmonary disease. Her lab data were significant for WBC of 11.7, hemoglobin of 10.4, sodium of 130, CO2 of 17, BUN of 27, and creatinine of 2.01. She was admitted for further evaluation and management.     Review of Systems:  As above in history present illness. In addition, she has a chronic tremor from Parkinson's disease; she has had a couple of falls with a bruise to her right leg; no loss of consciousness;  decrease in her memory; urinary incontinence; occasional mild cough; insomnia; overall poor appetite; otherwise review of systems is negative.  Past Medical History  Diagnosis Date  . Hypertension   . GERD (gastroesophageal reflux disease)   . Parkinson disease   . Depression   . Pulmonary embolism   . Arthritis   . Hypercalcemia   . Diabetes mellitus     family denies   Past Surgical History  Procedure Laterality Date  . Esophagogastroduodenoscopy  06/10/2011    Procedure: ESOPHAGOGASTRODUODENOSCOPY (EGD);  Surgeon: Amy Hippo, MD;  Location: [facility];  Service: Endoscopy;  Laterality: N/A;  . Vena cava filter placement    . Esophagogastroduodenoscopy  07/29/2011    Procedure: ESOPHAGOGASTRODUODENOSCOPY (EGD);  Surgeon: Amy Hippo, MD;  Location: [facility];  Service: Endoscopy;  Laterality: N/A;  . Laparoscopic nissen fundoplication  08/09/2011    Procedure: LAPAROSCOPIC NISSEN FUNDOPLICATION;  Surgeon: [name];  Location: [facility];  Service: General;  Laterality: N/A;  Laparoscopic Hiatal Hernia Repair  . Gastrostomy  08/09/2011    Procedure: GASTROSTOMY;  Surgeon: [name];  Location: [facility];  Service: General;  Laterality: Left;  Laparoscopic Gastrostomy Tube placement  . Hernia repair     Social History: The patient is widowed. She lives alone, but has around-the-clock hired caregivers. Her daughter [name] also assists. She is mostly limited with her ambulation, but does ambulate some with a walker and human assistance; she also gets around in a wheelchair. She denies tobacco and alcohol and illicit drug use.  Allergies  Allergen Reactions  . Reglan [Metoclopramide] Other (See Comments)    Tremors really bad   .  Metoclopramide Hcl Other (See Comments)    Affected Nervous system- severe jerkiing, shaking, etc..    Family history: Both parents are deceased.   Prior to Admission medications   Medication Sig Start Date End Date Taking? Authorizing  Provider  acetaminophen (TYLENOL) 325 MG tablet Take 650 mg by mouth 2 (two) times daily. Also given as needed for mild pain in addition to twice daily   Yes Historical Provider, MD  amitriptyline (ELAVIL) 10 MG tablet Take 10 mg by mouth at bedtime.     Yes Historical Provider, MD  amoxicillin (AMOXIL) 250 MG capsule Take 1 capsule by mouth 3 (three) times daily as needed (UTI).  06/14/14  Yes Historical Provider, MD  carbidopa-levodopa (SINEMET IR) 25-100 MG per tablet Take 2 tablets by mouth 3 (three) times daily. Before meals 02/07/14  Yes Amy RiggsNancy Carolyn Martin, NP  LORazepam (ATIVAN) 0.5 MG tablet Take 1 tablet (0.5 mg total) by mouth every 6 (six) hours as needed for anxiety. 05/09/13  Yes Amy Perchesoy Fagan, MD  mirtazapine (REMERON) 15 MG tablet Take 15 mg by mouth at bedtime.     Yes Historical Provider, MD  omeprazole (PRILOSEC) 10 MG capsule Take 10 mg by mouth every morning.   Yes Historical Provider, MD  oxymetazoline (NASAL SPRAY 12 HOUR) 0.05 % nasal spray Place 1-2 sprays into the nose daily as needed for congestion.   Yes Historical Provider, MD  PARoxetine (PAXIL) 10 MG tablet Take 10 mg by mouth every morning.   Yes Historical Provider, MD  traMADol (ULTRAM) 50 MG tablet Take 50 mg by mouth daily as needed for pain.   Yes Historical Provider, MD  trimethoprim (TRIMPEX) 100 MG tablet Take 100 mg by mouth at bedtime. 01/08/13  Yes Historical Provider, MD   Physical Exam: Filed Vitals:   06/15/14 0943 06/15/14 1000 06/15/14 1032 06/15/14 1325  BP: 125/71 130/63 124/76 160/79  Pulse: 87  82 84  Temp:    98.2 F (36.8 C)  TempSrc:    Oral  Resp: 18 18 18 18   Height:      Weight:      SpO2: 97%  98% 100%    Wt Readings from Last 3 Encounters:  06/15/14 60.782 kg (134 lb)  07/12/13 57.153 kg (126 lb)  05/31/13 57.153 kg (126 lb)    General: Elderly 80 year old woman laying in bed, in no acute distress. Eyes: PERRL, normal lids, irises & conjunctiva ENT: grossly normal hearing, lips &  tongue; mucous membranes mildly dry. Neck: no LAD, masses or thyromegaly Cardiovascular: RRR, no m/r/g. No LE edema. Telemetry: Not applicable. Respiratory: CTA bilaterally, no w/r/r. Normal respiratory effort. Abdomen: soft, ntnd; positive bowel sounds; no masses palpated. Skin: Mild ecchymosis on the pretibial surfaces Musculoskeletal: Mild diffuse arthritic changes in hands and knees; no acute hot red joints. Psychiatric: She is alert and oriented to herself, daughter, and hospital. Her speech is clear. Neurologic: Cranial nerves II through XII appear to be grossly intact.she has a mild pill-rolling tremor of both hands bilaterally.           Labs on Admission:  Basic Metabolic Panel:  Recent Labs Lab 06/15/14 0942  NA 130*  K 5.2  CL 96  CO2 17*  GLUCOSE 100*  BUN 27*  CREATININE 2.01*  CALCIUM 9.0   Liver Function Tests:  Recent Labs Lab 06/15/14 0942  AST 10  ALT <5  ALKPHOS 121*  BILITOT 0.2*  PROT 7.5  ALBUMIN 3.5   No results found for  this basename: LIPASE, AMYLASE,  in the last 168 hours No results found for this basename: AMMONIA,  in the last 168 hours CBC:  Recent Labs Lab 06/15/14 0942  WBC 11.7*  NEUTROABS 10.1*  HGB 10.4*  HCT 32.0*  MCV 99.4  PLT 193   Cardiac Enzymes:  Recent Labs Lab 06/15/14 0942  TROPONINI <0.30    BNP (last 3 results) No results found for this basename: PROBNP,  in the last 8760 hours CBG: No results found for this basename: GLUCAP,  in the last 168 hours  Radiological Exams on Admission: Dg Chest Port 1 View  06/15/2014   CLINICAL DATA:  Weakness.  EXAM: PORTABLE CHEST - 1 VIEW  COMPARISON:  May 06, 2013.  FINDINGS: Stable cardiomediastinal silhouette. No pneumothorax or pleural effusion is noted. No acute pulmonary disease is noted. Bony thorax is intact.  IMPRESSION: No acute cardiopulmonary abnormality seen.   Electronically Signed   By: Roque Lias M.D.   On: 06/15/2014 10:25    EKG: Independently  reviewed.  Assessment/Plan Principal Problem:   UTI (urinary tract infection) Active Problems:   Parkinson disease   Acute renal failure   Hyponatremia   Metabolic acidosis   Anemia, unspecified   1. Urinary tract infection. The patient has chronic urinary tract infections and failed an abbreviated outpatient antibiotic therapy. The urine culture has been ordered and is pending. She was given Rocephin in the emergency department. This will be continued. We'll start IV fluid hydration and supportive treatment otherwise. 2. Acute renal failure. This is likely secondary to prerenal azotemia/dehydration. Her renal function was within normal limits in 2014. We'll continue to follow her renal function which should improve with IV fluid hydration. 3. Metabolic acidosis. Likely secondary to associated acute renal failure/dehydration. We'll hold off on ordering a lactic acid level as it would not necessarily change the treatment plan. We will treat her metabolic acidosis with IV fluid hydration and management of her urinary tract infection. Consider adding sodium bicarbonate to the IV fluids if her metabolic acidosis does not resolve by tomorrow morning. 4. Hyponatremia, secondary to hypovolemia. We'll continue IV fluids started in the emergency department. Continue to follow her serum sodium. 5. Normocytic anemia. We'll continue to follow. 6. Parkinson's disease. We'll continue Sinemet.    Code Status: DO NOT RESUSCITATE DVT Prophylaxis: Subcutaneous heparin Family Communication: Discuss with [name], her daughter. Disposition Plan: Discharge to home in a few days when clinically improved.  Time spent: One hour.  [facility] Pager [phone]  **Disclaimer: This note may have been dictated with voice recognition software. Similar sounding words can inadvertently be transcribed and this note may contain transcription errors which may not have been corrected upon publication of  note.**

## 2014-06-15 NOTE — ED Notes (Signed)
Family reports pt has had generalized weakness that has gotten progressively worse since Thursday.  Also reports decreased urine output since last night.  Pt c/o pain in r shoulder since last night.  Pt fell yesterday and has bruise to r lower leg.

## 2014-06-15 NOTE — Progress Notes (Signed)
Utilization review Completed Zavon Hyson RN BSN   

## 2014-06-15 NOTE — ED Provider Notes (Signed)
CSN: [id number]     Arrival date & time 06/15/14  0848 History  This chart was scribed for [facility]athan R. Rubin PayorPickering, MD by Jarvis Morganaylor Ferguson, ED Scribe. This patient was seen in room [facility] and the patient's care was started at 9:18 AM.    Chief Complaint  Patient presents with  . Weakness    The history is provided by the patient and a relative. No language interpreter was used.   HPI Comments: Jacques EarthlyHallie Dumond is a 80 y.o. female with a h/o HTN, GERD, Parkinson's, depression, PE, DM, arthritis and hypercalcemia, who presents to the Emergency Department complaining of constant, gradually worsening, generalized weakness for 3 days. Family states pt has been having decreased urine output since last night, cough, and heart palpitations at night. She also reports "3/10", "aching" right shoulder pain for 2 days, trouble sleeping and decreased liquid intake. Pt states she has been eating normally. Family notes that pt had some diarrhea last week but none now. Believes it was due to her Keflex treatment for chronic UTIs. Family also notes pt sometimes gets increased confusion and melena with Keflex and when that occurs they switch her to a new medication.They also note that she fell yesterday and has a bruise to her right lower leg. They deny any head injury or LOC.  Pt follows up for a Neurologist for her Parkinson's. Daughter states she does not have real great motion at baseline and when she gets a UTI or some illness her Parkinson's gets worse. Family denies any bedsores, fever, nausea, emesis, or chest pain.    Past Medical History  Diagnosis Date  . Hypertension   . GERD (gastroesophageal reflux disease)   . Parkinson disease   . Depression   . Pulmonary embolism   . Arthritis   . Hypercalcemia   . Diabetes mellitus     family denies   Past Surgical History  Procedure Laterality Date  . Esophagogastroduodenoscopy  06/10/2011    Procedure: ESOPHAGOGASTRODUODENOSCOPY (EGD);  Surgeon: Malissa HippoNajeeb U  [name], MD;  [facility];  Service: Endoscopy;  Laterality: N/A;  . Vena cava filter placement    . Esophagogastroduodenoscopy  07/29/2011    Procedure: ESOPHAGOGASTRODUODENOSCOPY (EGD);  Surgeon: Malissa HippoNajeeb U [name], MD;  Location: [facility];  Service: Endoscopy;  Laterality: N/A;  . Laparoscopic nissen fundoplication  08/09/2011    Procedure: LAPAROSCOPIC NISSEN FUNDOPLICATION;  Surgeon: Fabio BeringBrent C Ziegler;  Location: [facility];  Service: General;  Laterality: N/A;  Laparoscopic Hiatal Hernia Repair  . Gastrostomy  08/09/2011    Procedure: GASTROSTOMY;  Surgeon: Fabio BeringBrent C Ziegler;  Location: [facility];  Service: General;  Laterality: Left;  Laparoscopic Gastrostomy Tube placement  . Hernia repair     History reviewed. No pertinent family history. History  Substance Use Topics  . Smoking status: Never Smoker   . Smokeless tobacco: Never Used  . Alcohol Use: No   OB History   Grav Para Term Preterm Abortions TAB SAB Ect Mult Living                 Review of Systems  Constitutional: Negative for fever and appetite change.  Respiratory: Positive for cough.   Cardiovascular: Positive for palpitations (at night). Negative for chest pain.  Gastrointestinal: Positive for diarrhea (last week, now resolved). Negative for nausea, vomiting and abdominal pain.  Genitourinary: Positive for decreased urine volume.  Musculoskeletal: Positive for arthralgias (right shoulder pain).  Skin: Positive for color change (bruise to R lower leg from fall yesterday).  Negative for wound.  Neurological: Positive for tremors (at baseline due to Parkinsons) and weakness (generalized; 3 days). Negative for syncope and headaches.  Psychiatric/Behavioral: Positive for confusion and sleep disturbance.  All other systems reviewed and are negative.     Allergies  Reglan and Metoclopramide hcl  Home Medications   Prior to Admission medications   Medication Sig Start Date End Date Taking? Authorizing Provider   acetaminophen (TYLENOL) 325 MG tablet Take 650 mg by mouth 2 (two) times daily. Also given as needed for mild pain in addition to twice daily   Yes Historical Provider, MD  amitriptyline (ELAVIL) 10 MG tablet Take 10 mg by mouth at bedtime.     Yes Historical Provider, MD  amoxicillin (AMOXIL) 250 MG capsule Take 1 capsule by mouth 3 (three) times daily as needed (UTI).  06/14/14  Yes Historical Provider, MD  carbidopa-levodopa (SINEMET IR) 25-100 MG per tablet Take 2 tablets by mouth 3 (three) times daily. Before meals 02/07/14  Yes Nilda Riggs, NP  LORazepam (ATIVAN) 0.5 MG tablet Take 1 tablet (0.5 mg total) by mouth every 6 (six) hours as needed for anxiety. 05/09/13  Yes Carylon Perches, MD  mirtazapine (REMERON) 15 MG tablet Take 15 mg by mouth at bedtime.     Yes Historical Provider, MD  omeprazole (PRILOSEC) 10 MG capsule Take 10 mg by mouth every morning.   Yes Historical Provider, MD  oxymetazoline (NASAL SPRAY 12 HOUR) 0.05 % nasal spray Place 1-2 sprays into the nose daily as needed for congestion.   Yes Historical Provider, MD  PARoxetine (PAXIL) 10 MG tablet Take 10 mg by mouth every morning.   Yes Historical Provider, MD  traMADol (ULTRAM) 50 MG tablet Take 50 mg by mouth daily as needed for pain.   Yes Historical Provider, MD  trimethoprim (TRIMPEX) 100 MG tablet Take 100 mg by mouth at bedtime. 01/08/13  Yes Historical Provider, MD   Triage Vitals: BP 117/88  Pulse 95  Temp(Src) 97.8 F (36.6 C) (Oral)  Resp 20  Ht 5\' 3"  (1.6 m)  Wt 134 lb (60.782 kg)  BMI 23.74 kg/m2  SpO2 100%  Physical Exam  Nursing note and vitals reviewed. Constitutional: She is oriented to person, place, and time. She appears well-developed and well-nourished. No distress.  Awake and appropriate. Resting tremor diffusely.  HENT:  Head: Normocephalic and atraumatic.  Mouth/Throat: Mucous membranes are dry.  Eyes: Conjunctivae and EOM are normal. Pupils are equal, round, and reactive to light.   Neck: Neck supple. No tracheal deviation present.  Cardiovascular: Normal rate, regular rhythm and normal heart sounds.  Exam reveals no gallop and no friction rub.   No murmur heard. Pulmonary/Chest: Effort normal and breath sounds normal. No respiratory distress.  Abdominal: Soft. Bowel sounds are normal. There is no tenderness.  Musculoskeletal: Normal range of motion.   Moves all extremities but has decreased strength in lower legs chronically.  Neurological: She is alert and oriented to person, place, and time.  Skin: Skin is warm and dry. Ecchymosis noted.  2 cm ecchymotic area laterally on right lower leg   Psychiatric: She has a normal mood and affect. Her behavior is normal.    ED Course  Procedures (including critical care time)  COORDINATION OF CARE: 9:27 AM- Will order cath, EKG, UA, CBC w/ diff, CMP, Troponin Stat, and chest port. Pt advised of plan for treatment and pt agrees.     Labs Review Labs Reviewed  URINALYSIS, ROUTINE W REFLEX MICROSCOPIC -  Abnormal; Notable for the following:    APPearance HAZY (*)    Hgb urine dipstick LARGE (*)    Protein, ur 30 (*)    Leukocytes, UA LARGE (*)    All other components within normal limits  CBC WITH DIFFERENTIAL - Abnormal; Notable for the following:    WBC 11.7 (*)    RBC 3.22 (*)    Hemoglobin 10.4 (*)    HCT 32.0 (*)    Neutrophils Relative % 87 (*)    Lymphocytes Relative 4 (*)    Neutro Abs 10.1 (*)    Lymphs Abs 0.5 (*)    Monocytes Absolute 1.1 (*)    All other components within normal limits  COMPREHENSIVE METABOLIC PANEL - Abnormal; Notable for the following:    Sodium 130 (*)    CO2 17 (*)    Glucose, Bld 100 (*)    BUN 27 (*)    Creatinine, Ser 2.01 (*)    Alkaline Phosphatase 121 (*)    Total Bilirubin 0.2 (*)    GFR calc non Af Amer 21 (*)    GFR calc Af Amer 24 (*)    Anion gap 17 (*)    All other components within normal limits  URINE MICROSCOPIC-ADD ON - Abnormal; Notable for the  following:    Bacteria, UA MANY (*)    All other components within normal limits  BASIC METABOLIC PANEL - Abnormal; Notable for the following:    Sodium 133 (*)    CO2 18 (*)    BUN 25 (*)    Creatinine, Ser 2.04 (*)    GFR calc non Af Amer 20 (*)    GFR calc Af Amer 24 (*)    All other components within normal limits  CBC - Abnormal; Notable for the following:    RBC 2.87 (*)    Hemoglobin 9.4 (*)    HCT 28.7 (*)    All other components within normal limits  URINE CULTURE  TROPONIN I    Imaging Review Dg Chest Port 1 View  06/15/2014   CLINICAL DATA:  Weakness.  EXAM: PORTABLE CHEST - 1 VIEW  COMPARISON:  May 06, 2013.  FINDINGS: Stable cardiomediastinal silhouette. No pneumothorax or pleural effusion is noted. No acute pulmonary disease is noted. Bony thorax is intact.  IMPRESSION: No acute cardiopulmonary abnormality seen.   Electronically Signed   By: Roque Lias M.D.   On: 06/15/2014 10:25     EKG Interpretation None      MDM   Final diagnoses:  UTI (lower urinary tract infection)  Dehydration  Renal insufficiency    Patient presents with generalized weakness for 3 days. Has history of Parkinson's disease. Decreased oral intake. Has urinary tract infection dehydration. Has a increased her creatinine. Has been on antibiotics already. Will admit to internal medicine  I personally performed the services described in this documentation, which was scribed in my presence. The recorded information has been reviewed and is accurate.    Juliet Rude. Rubin Payor, MD 06/16/14 1006

## 2014-06-16 ENCOUNTER — Inpatient Hospital Stay (HOSPITAL_COMMUNITY): Payer: Medicare Other

## 2014-06-16 LAB — BASIC METABOLIC PANEL
Anion gap: 15 (ref 5–15)
BUN: 25 mg/dL — ABNORMAL HIGH (ref 6–23)
CALCIUM: 8.9 mg/dL (ref 8.4–10.5)
CHLORIDE: 100 meq/L (ref 96–112)
CO2: 18 mEq/L — ABNORMAL LOW (ref 19–32)
Creatinine, Ser: 2.04 mg/dL — ABNORMAL HIGH (ref 0.50–1.10)
GFR calc Af Amer: 24 mL/min — ABNORMAL LOW (ref >90)
GFR calc non Af Amer: 20 mL/min — ABNORMAL LOW (ref >90)
GLUCOSE: 87 mg/dL (ref 70–99)
Potassium: 4.8 mEq/L (ref 3.7–5.3)
Sodium: 133 mEq/L — ABNORMAL LOW (ref 137–147)

## 2014-06-16 LAB — CBC
HCT: 28.7 % — ABNORMAL LOW (ref 36.0–46.0)
Hemoglobin: 9.4 g/dL — ABNORMAL LOW (ref 12.0–15.0)
MCH: 32.8 pg (ref 26.0–34.0)
MCHC: 32.8 g/dL (ref 30.0–36.0)
MCV: 100 fL (ref 78.0–100.0)
Platelets: 184 10*3/uL (ref 150–400)
RBC: 2.87 MIL/uL — ABNORMAL LOW (ref 3.87–5.11)
RDW: 14.4 % (ref 11.5–15.5)
WBC: 6.6 10*3/uL (ref 4.0–10.5)

## 2014-06-16 MED ORDER — LORAZEPAM 2 MG/ML IJ SOLN
1.0000 mg | Freq: Once | INTRAMUSCULAR | Status: AC
  Administered 2014-06-16: 1 mg via INTRAVENOUS
  Filled 2014-06-16: qty 1

## 2014-06-16 MED ORDER — ACETAMINOPHEN 500 MG PO TABS: 500.0000 mg | ORAL_TABLET | Freq: Four times a day (QID) | ORAL | Status: DC | PRN

## 2014-06-16 MED ORDER — PNEUMOCOCCAL VAC POLYVALENT 25 MCG/0.5ML IJ INJ
0.5000 mL | INJECTION | INTRAMUSCULAR | Status: AC
  Administered 2014-06-17: 0.5 mL via INTRAMUSCULAR
  Filled 2014-06-16 (×2): qty 0.5

## 2014-06-16 MED ORDER — ACETAMINOPHEN 650 MG RE SUPP
650.0000 mg | Freq: Three times a day (TID) | RECTAL | Status: DC
  Administered 2014-06-16 – 2014-06-17 (×4): 650 mg via RECTAL
  Filled 2014-06-16 (×4): qty 1

## 2014-06-16 MED ORDER — ACETAMINOPHEN 500 MG PO TABS: 500.0000 mg | ORAL_TABLET | Freq: Three times a day (TID) | ORAL | Status: DC

## 2014-06-16 MED ORDER — ACETAMINOPHEN 500 MG PO TABS
500.0000 mg | ORAL_TABLET | Freq: Three times a day (TID) | ORAL | Status: DC
  Administered 2014-06-17 – 2014-06-19 (×6): 500 mg via ORAL
  Filled 2014-06-16 (×7): qty 1

## 2014-06-16 MED ORDER — ACETAMINOPHEN 325 MG PO TABS: 650.0000 mg | ORAL_TABLET | Freq: Three times a day (TID) | ORAL | Status: DC

## 2014-06-16 NOTE — Progress Notes (Signed)
[name] RUE:[id number] DOB: 23-Sep-1923 DOA: 06/15/2014 PCP: Carylon Perches, MD   Subjective: This 80 year old lady was admitted yesterday with confusion and this is likely from UTI. Overnight she did become somewhat delirious and tremulous apparently. This occurred after she had one dose of Ambien. I had prescribed this because the family were concerned that she was not sleeping. Once the Ambien was given, she had some subtle tremors. She apparently eventually became sleepy and this morning is fairly drowsy, likely from the effects of Ambien. Her by mouth intake has been rather poor over the last few days, presumably because of her UTI. She is currently grabbing her right shoulder and right-sided neck, indicating that she might have pain there. She did have a fall earlier in the week.           Physical Exam: Blood pressure 147/91, pulse 79, temperature 98 F (36.7 C), temperature source Axillary, resp. rate 19, height  (1.6 m), weight 60.782 kg (134 lb), SpO2 100.00%. She looks delirious. She is hemodynamically stable. Heart sounds are present without gallop rhythm. Lung fields are clear. She appears to be grabbing her right shoulder but I'm able to move her right shoulder fairly easily without inflicting too much pain. She does grimace slightly when I move the right shoulder. She does not appear to have any focalizing neurological signs.   Investigations:  No results found for this or any previous visit (from the past 240 hour(s)).   Basic Metabolic Panel:  Recent Labs  14/78/29 0942 06/16/14 0553  NA 130* 133*  K 5.2 4.8  CL 96 100  CO2 17* 18*  GLUCOSE 100* 87  BUN 27* 25*  CREATININE 2.01* 2.04*  CALCIUM 9.0 8.9   Liver Function Tests:  Recent Labs  06/15/14 0942  AST 10  ALT <5  ALKPHOS 121*  BILITOT 0.2*  PROT 7.5  ALBUMIN 3.5     CBC:  Recent Labs  06/15/14 0942 06/16/14 0553  WBC 11.7* 6.6  NEUTROABS 10.1*  --   HGB 10.4* 9.4*  HCT 32.0*  28.7*  MCV 99.4 100.0  PLT 193 184    Dg Chest Port 1 View  06/15/2014   CLINICAL DATA:  Weakness.  EXAM: PORTABLE CHEST - 1 VIEW  COMPARISON:  May 06, 2013.  FINDINGS: Stable cardiomediastinal silhouette. No pneumothorax or pleural effusion is noted. No acute pulmonary disease is noted. Bony thorax is intact.  IMPRESSION: No acute cardiopulmonary abnormality seen.   Electronically Signed   By: Roque Lias M.D.   On: 06/15/2014 10:25      Medications: I have reviewed the patient's current medications.  Impression: 1. Altered mental status secondary to UTI and and more recently fracture of Ambien. 2. Acute renal failure based on lab work from approximately one year ago, compounding her altered mental status. 3. Apparent right shoulder/right neck pain, status post fall. 4. Parkinson's disease. 5. Metabolic acidosis, secondary to #2. 6. Anemia, likely of chronic disease.     Plan: 1. Increase IV fluids at 125 cc an hour, normal saline. Monitor renal function daily. 2. Discontinue Ambien. 3. Start scheduled acetaminophen every 8 hours with further acetaminophen when necessary for breakthrough. I am not keen to do stronger narcotic medications, or tramadol, in view of her confusion. 4. Right shoulder and neck x-ray. 5. Continue with intravenous Rocephin for UTI.  Consultants:  None.   Procedures:  None.   Antibiotics:  Intravenous Rocephin.  Code Status: DO NOT RESUSCITATE.  Family Communication: I discussed the plan with the patient's daughter at the bedside.   Disposition Plan: Depending on progress.  Time spent: 20 minutes.   LOS: 1 day   [name] C   06/16/2014, 12:45 PM

## 2014-06-16 NOTE — Progress Notes (Signed)
Paged MD to notify that patient is continuing to grab right neck and shoulder and that she is not able to stay awake long enough to take morning medications.  Daughter present at bedside and refused medication since patient is not able to fully arouse. Patient responds to pain and occasionally verbal stimulus.  Patient states that her neck and shoulder hurt.  Waiting on response from MD and will continue to monitor patient.

## 2014-06-16 NOTE — Progress Notes (Signed)
Family called nurse to check pt d/t increasing tremors. Pt having course tremors at this time. VSS. Notified Dr Karilyn Cota regarding this, who ordered .0.5 mg Ativan IV PRN. Will administer and continue to monitor pt frequently throughout night. Placed 2L [place] O2 d/t Ativan administration. Pt laying in bed with bed alarm "on" and bed is in lowest position. Family at bedside.

## 2014-06-16 NOTE — Progress Notes (Signed)
Pt holding onto right shoulder, in pain. Will administer Ultram 25 mg PO. Family voiced concerns to nurse regarding pt not being able to sleep at all. Family member states, "Is there anything she can take to help her sleep?" Nurse called Dr Karilyn Cota, who ordered 5 mg Ambien PRN HS for sleep. Will administer that also. Pt laying in bed at lowest position. Call bell is within reach. Will continue to monitor pt frequently throughout night. Family at bedside.

## 2014-06-16 NOTE — Progress Notes (Signed)
Nurse in to re-assess pt. Pt still having same course tremors. Nurse notified Karilyn Cota again, who ordered 1 mg Ativan IV X1. Will administer and re-evaluate pt after. Family still at bedside.

## 2014-06-17 DIAGNOSIS — N390 Urinary tract infection, site not specified: Secondary | ICD-10-CM | POA: Diagnosis not present

## 2014-06-17 LAB — BASIC METABOLIC PANEL WITH GFR
Anion gap: 12 (ref 5–15)
BUN: 19 mg/dL (ref 6–23)
CO2: 17 meq/L — ABNORMAL LOW (ref 19–32)
Calcium: 8.4 mg/dL (ref 8.4–10.5)
Chloride: 110 meq/L (ref 96–112)
Creatinine, Ser: 1.35 mg/dL — ABNORMAL HIGH (ref 0.50–1.10)
GFR calc Af Amer: 39 mL/min — ABNORMAL LOW
GFR calc non Af Amer: 34 mL/min — ABNORMAL LOW
Glucose, Bld: 86 mg/dL (ref 70–99)
Potassium: 4.6 meq/L (ref 3.7–5.3)
Sodium: 139 meq/L (ref 137–147)

## 2014-06-17 LAB — CBC
HCT: 25.2 % — ABNORMAL LOW (ref 36.0–46.0)
Hemoglobin: 8.3 g/dL — ABNORMAL LOW (ref 12.0–15.0)
MCH: 32.5 pg (ref 26.0–34.0)
MCHC: 32.9 g/dL (ref 30.0–36.0)
MCV: 98.8 fL (ref 78.0–100.0)
Platelets: 176 10*3/uL (ref 150–400)
RBC: 2.55 MIL/uL — ABNORMAL LOW (ref 3.87–5.11)
RDW: 14.6 % (ref 11.5–15.5)
WBC: 5 10*3/uL (ref 4.0–10.5)

## 2014-06-17 MED ORDER — TRAZODONE HCL 50 MG PO TABS: 25.0000 mg | ORAL_TABLET | Freq: Every evening | ORAL | Status: DC | PRN

## 2014-06-17 NOTE — Progress Notes (Signed)
Subjective: [name] was admitted by the hospitalist with a urinary tract infection and confusion. She had a mild leukocytosis initially which has normalized. She is not experiencing fever. Her urine culture is pending. She was dehydrated with a rise in her creatinine which is improving.  Objective: Vital signs in last 24 hours: Filed Vitals:   06/16/14 1428 06/16/14 2204 06/16/14 2300 06/17/14 0531  BP: 100/73 87/48 119/60 120/57  Pulse: 90 79 78 82  Temp: 99 F (37.2 C) 98.2 F (36.8 C)  98 F (36.7 C)  TempSrc: Axillary Axillary  Axillary  Resp: Height:      Weight:    144 lb 13.5 oz (65.7 kg)  SpO2: 100% 99%  100%   Weight change: 10 lb 13.5 oz (4.918 kg)  Intake/Output Summary (Last 24 hours) at 06/17/14 0735 Last data filed at 06/16/14 1838  Gross per 24 hour  Intake 1380.42 ml  Output      0 ml  Net 1380.42 ml    Physical Exam: No distress. Talking but confused. Lungs clear. Heart regular with no murmurs. Abdomen is soft and nontender with no organomegaly. Extremities revealed no edema.  Lab Results:    Results for orders placed during the hospital encounter of 06/15/14 (from the past 24 hour(s))  CBC     Status: Abnormal   Collection Time    06/17/14  6:23 AM      Result Value Ref Range   WBC 5.0  4.0 - 10.5 K/uL   RBC 2.55 (*) 3.87 - 5.11 MIL/uL   Hemoglobin 8.3 (*) 12.0 - 15.0 g/dL   HCT 16.1 (*) 09.6 - 04.5 %   MCV 98.8  78.0 - 100.0 fL   MCH 32.5  26.0 - 34.0 pg   MCHC 32.9  30.0 - 36.0 g/dL   RDW 40.9  81.1 - 91.4 %   Platelets 176  150 - 400 K/uL  BASIC METABOLIC PANEL     Status: Abnormal   Collection Time    06/17/14  6:23 AM      Result Value Ref Range   Sodium 139  137 - 147 mEq/L   Potassium 4.6  3.7 - 5.3 mEq/L   Chloride 110  96 - 112 mEq/L   CO2 17 (*) 19 - 32 mEq/L   Glucose, Bld 86  70 - 99 mg/dL   BUN 19  6 - 23 mg/dL   Creatinine, Ser 7.82 (*) 0.50 - 1.10 mg/dL   Calcium 8.4  8.4 - 95.6 mg/dL   GFR calc non Af Amer 34  (*) >90 mL/min   GFR calc Af Amer 39 (*) >90 mL/min   Anion gap 12  5 - 15     ABGS No results found for this basename: PHART, PCO2, PO2ART, TCO2, HCO3,  in the last 72 hours CULTURES No results found for this or any previous visit (from the past 240 hour(s)). Studies/Results: Dg Neck Soft Tissue  06/16/2014   CLINICAL DATA:  Right shoulder and neck pain status post fall 3 days ago.  EXAM: NECK SOFT TISSUES - 1+ VIEW  COMPARISON:  None.  FINDINGS: The anterior view is non diagnostic. On the lateral film the soft tissues of the neck through C4 appear normal. C1 through C4 also are grossly normal. The visualized portions of the mandible are intact.  IMPRESSION: The limited view of the cervical soft tissues reveals no acute abnormality through C4.   Electronically Signed  By: David  [place]   On: 06/16/2014 13:24   Dg Shoulder 1v Right  06/16/2014   CLINICAL DATA:  Right shoulder and neck pain status post fall 3 days ago  EXAM: RIGHT SHOULDER - 1 VIEW  COMPARISON:  None.  FINDINGS: A single portable right shoulder film is submitted. The bones are osteopenic. The glenohumeral joint is grossly normal. There is mild degenerative change of the [facility] joint. The observed portions of the right clavicle and upper right ribs are normal.  IMPRESSION: There is no acute bony abnormality of the right shoulder on this single view.   Electronically Signed   By: David  [place]   On: 06/16/2014 13:23   Dg Chest Port 1 View  06/15/2014   CLINICAL DATA:  Weakness.  EXAM: PORTABLE CHEST - 1 VIEW  COMPARISON:  May 06, 2013.  FINDINGS: Stable cardiomediastinal silhouette. No pneumothorax or pleural effusion is noted. No acute pulmonary disease is noted. Bony thorax is intact.  IMPRESSION: No acute cardiopulmonary abnormality seen.   Electronically Signed   By: Roque Lias M.D.   On: 06/15/2014 10:25   Micro Results: No results found for this or any previous visit (from the past 240 hour(s)). Studies/Results: Dg Neck  Soft Tissue  06/16/2014   CLINICAL DATA:  Right shoulder and neck pain status post fall 3 days ago.  EXAM: NECK SOFT TISSUES - 1+ VIEW  COMPARISON:  None.  FINDINGS: The anterior view is non diagnostic. On the lateral film the soft tissues of the neck through C4 appear normal. C1 through C4 also are grossly normal. The visualized portions of the mandible are intact.  IMPRESSION: The limited view of the cervical soft tissues reveals no acute abnormality through C4.   Electronically Signed   By: David  [place]   On: 06/16/2014 13:24   Dg Shoulder 1v Right  06/16/2014   CLINICAL DATA:  Right shoulder and neck pain status post fall 3 days ago  EXAM: RIGHT SHOULDER - 1 VIEW  COMPARISON:  None.  FINDINGS: A single portable right shoulder film is submitted. The bones are osteopenic. The glenohumeral joint is grossly normal. There is mild degenerative change of the [facility] joint. The observed portions of the right clavicle and upper right ribs are normal.  IMPRESSION: There is no acute bony abnormality of the right shoulder on this single view.   Electronically Signed   By: David  [place]   On: 06/16/2014 13:23   Dg Chest Port 1 View  06/15/2014   CLINICAL DATA:  Weakness.  EXAM: PORTABLE CHEST - 1 VIEW  COMPARISON:  May 06, 2013.  FINDINGS: Stable cardiomediastinal silhouette. No pneumothorax or pleural effusion is noted. No acute pulmonary disease is noted. Bony thorax is intact.  IMPRESSION: No acute cardiopulmonary abnormality seen.   Electronically Signed   By: Roque Lias M.D.   On: 06/15/2014 10:25   Medications:  I have reviewed the patient's current medications Scheduled Meds: . acetaminophen  500 mg Oral 3 times per day   Or  . acetaminophen  650 mg Rectal 3 times per day  . carbidopa-levodopa  2 tablet Oral TID AC  . cefTRIAXone (ROCEPHIN)  IV  1 g Intravenous Q24H  . heparin  5,000 Units Subcutaneous 3 times per day  . mirtazapine  15 mg Oral QHS  . pantoprazole  40 mg Oral Daily  . PARoxetine  10  mg Oral q morning - 10a  . pneumococcal 23 valent vaccine  0.5 mL Intramuscular Tomorrow-1000  Continuous Infusions: . sodium chloride 125 mL/hr at 06/17/14 0300   PRN Meds:.acetaminophen, albuterol, alum & mag hydroxide-simeth, guaiFENesin-dextromethorphan, LORazepam, ondansetron (ZOFRAN) IV, ondansetron, oxymetazoline, traZODone   Assessment/Plan: #1. UTI. Culture pending. Continue Rocephin. #2. Renal insufficiency. Dehydration. Creatinine is improved to 1.35. Decrease IV fluids from 125 cc an hour to 75 cc an hour. #3. Acidosis. Bicarbonate 17. #4. Parkinson's disease. Confusion. Continue treatment of her underlying infection and observation. #5. Hyponatremia. Sodium has normalized with IV fluids. Principal Problem:   UTI (urinary tract infection) Active Problems:   Parkinson disease   Acute renal failure   Hyponatremia   Metabolic acidosis   Anemia, unspecified     LOS: 2 days   Praveen Coia 06/17/2014, 7:35 AM

## 2014-06-17 NOTE — Progress Notes (Signed)
Pt turned Q2hours throughout night and cleaned up with help of Dody, Tech. Pt slept throughout most of the night. Pt laying in bed at lowest position & call bell within reach. Will continue to monitor pt throughout night.

## 2014-06-17 NOTE — Progress Notes (Addendum)
INITIAL NUTRITION ASSESSMENT  DOCUMENTATION CODES Per approved criteria  -Not Applicable   INTERVENTION: CHO Modified diet   Encourage meal and beverage intake daily  Assist with tray set-up as needed  Offer snack as needed qhs  NUTRITION DIAGNOSIS: Inadequate oral intake related to infection as evidenced by UTI and dehydration on admission  Goal: [name] will consume 50-75% of most meals and be free of skin breakdown   Monitor: Skin assessments, meal intake and labs   Reason for Assessment: Low Braden=10   80 y.o. female  Admitting Dx: UTI (urinary tract infection)  ASSESSMENT: Pt presented dehydrated with UTI. She is receiving IV fluids and is afebrile. She ate 100% of breakfast this morning. She is hard of hearing and prefers you speak into her left ear. Pt says over the past couple of months she had gain some weight but appetite had decreased prior to admission. She says she spends most of her time in bed and just don't get as hungry as she used to. She had been able to feed herself but says recently her hands had been shaking so that she was wasting her food before she could eat it.  Physical assessment: prophylactic heel protection bi-laterally, mild-moderate wasting to dorsal, clavicle and  acromion regions. No skin breakdown noted but is at risk based on her Braden Score. Pressure ulcer prevention measures in place.  Labs:Creatinine 1.35 improving   Height: Ht Readings from Last 1 Encounters:  06/15/14  (1.6 m)    Weight: Wt Readings from Last 1 Encounters:  06/17/14 144 lb 13.5 oz (65.7 kg)    Ideal Body Weight: 115#  % Ideal Body Weight: 126%  Wt Readings from Last 10 Encounters:  06/17/14 144 lb 13.5 oz (65.7 kg)  07/12/13 126 lb (57.153 kg)  05/31/13 126 lb (57.153 kg)  05/15/13 126 lb (57.153 kg)  05/06/13 120 lb 9.5 oz (54.7 kg)  04/30/13 126 lb (57.153 kg)  04/21/13 126 lb (57.153 kg)  01/11/13 126 lb (57.153 kg)  11/28/11 130 lb  (58.968 kg)  10/21/11 130 lb (58.968 kg)    Usual Body Weight: 125-130#  % Usual Body Weight: 115%  BMI:  Body mass index is 25.66 kg/(m^2).overweight  Estimated Nutritional Needs: Kcal: 1650-1980 Protein: 80 gr Fluid: 2000 ml daily  Skin: intact  Diet Order: Carb Control po 100% breakfast  EDUCATION NEEDS: -No education needs identified at this time   Intake/Output Summary (Last 24 hours) at 06/17/14 1113 Last data filed at 06/17/14 0830  Gross per 24 hour  Intake 1233.75 ml  Output      0 ml  Net 1233.75 ml    Last BM: 06/13/14  Labs:   Recent Labs Lab 06/15/14 0942 06/16/14 0553 06/17/14 0623  NA 130* 133* 139  K 5.2 4.8 4.6  CL 96 100 110  CO2 17* 18* 17*  BUN 27* 25* 19  CREATININE 2.01* 2.04* 1.35*  CALCIUM 9.0 8.9 8.4  GLUCOSE 100* 87 86    CBG (last 3)  No results found for this basename: GLUCAP,  in the last 72 hours  Scheduled Med's: . acetaminophen  500 mg Oral 3 times per day   Or  . acetaminophen  650 mg Rectal 3 times per day  . carbidopa-levodopa  2 tablet Oral TID AC  . cefTRIAXone (ROCEPHIN)  IV  1 g Intravenous Q24H  . heparin  5,000 Units Subcutaneous 3 times per day  . mirtazapine  15 mg Oral QHS  .  pantoprazole  40 mg Oral Daily  . PARoxetine  10 mg Oral q morning - 10a  . pneumococcal 23 valent vaccine  0.5 mL Intramuscular Tomorrow-1000    Continuous Infusions: . sodium chloride 75 mL/hr at 06/17/14 1610    Past Medical History  Diagnosis Date  . Hypertension   . GERD (gastroesophageal reflux disease)   . Parkinson disease   . Depression   . Pulmonary embolism   . Arthritis   . Hypercalcemia   . Diabetes mellitus     family denies    Past Surgical History  Procedure Laterality Date  . Esophagogastroduodenoscopy  06/10/2011    Procedure: ESOPHAGOGASTRODUODENOSCOPY (EGD);  Surgeon: Malissa Hippo, MD;  Location: [facility];  Service: Endoscopy;  Laterality: N/A;  . Vena cava filter placement    .  Esophagogastroduodenoscopy  07/29/2011    Procedure: ESOPHAGOGASTRODUODENOSCOPY (EGD);  Surgeon: Malissa Hippo, MD;  Location: [facility];  Service: Endoscopy;  Laterality: N/A;  . Laparoscopic nissen fundoplication  08/09/2011    Procedure: LAPAROSCOPIC NISSEN FUNDOPLICATION;  Surgeon: Fabio Bering;  Location: [facility];  Service: General;  Laterality: N/A;  Laparoscopic Hiatal Hernia Repair  . Gastrostomy  08/09/2011    Procedure: GASTROSTOMY;  Surgeon: Fabio Bering;  Location: [facility];  Service: General;  Laterality: Left;  Laparoscopic Gastrostomy Tube placement  . Hernia repair      Royann Shivers Amy,RD,CSG,LDN Office: [phone] Pager: [phone]

## 2014-06-17 NOTE — Evaluation (Signed)
Physical Therapy Evaluation Patient Details Name: [name] MRN: [id number] DOB: 1923/05/29 Today's Date: 06/17/2014   History of Present Illness  [name] is a 80 y.o. female with a history of chronic urinary tract infections-on suppressive therapy with trimethoprim, Parkinson's disease, and pulmonary embolism-status post IVC filter, who presents with a complaint of generalized weakness. The history is being provided by the patient and the patient's daughter [name]. Accordingly, the patient has had generalized weakness for the past 3 days. [name] has noticed that the patient's urine had become dark and malodorous approximately one week ago. The patient also had more confusion than usual. She also had intermittent jerking of her arms and legs. All of these symptoms, as explained by [name], usually occur when the patient develops an active urinary tract infection. [name] increased the dose of trimethoprim and tried a few doses of Keflex which she has on hand for active infections. The patient did not appear to be improve. She denies abdominal pain, pain with urination, nausea, vomiting, or active diarrhea. She did have loose stools one week ago which were attributed to the increase in antibiotic dose.  In the emergency department, she was afebrile and hemodynamically stable. Her urinalysis revealed large leukocytes, too numerous to count WBCs, too numerous to count RBCs, and many bacteria. Her chest x-ray revealed no acute cardiopulmonary disease.  Clinical Impression  Pt is an 80 year old female who presents to PT with dx of UTI.  Per family, pt requires some assistance with functional mobility skills secondary to posterior lean due to PD.  Pt is able to amb ~15 feet with use of RW and min guard assist from family, though spends most of the day in her W/C.  During evaluation, pt required min/mod assist for supine -> sit and max assist for sit -> supine.  Once at EOB, pt able to maintain balance in sacral sitting  with (B) UE support ane without LE support.  Pt required max assist to transfer to standing and max assist to transfer bodyweight forward over feet secondary to posterior lean; min assist to maintain standing while holding onto RW.  Pt able to clear Rt LE from floor, though unable to clear Lt LE for amb forward; pt was able to maintain side stepping Lt with min assist.  Noted decreased knee extension secondary to contractures, and inversion of (B) feet.  Pt has a significant hx of sx to feet with resultant deformity and inverted position.  Recommend continued PT while in the hospital to address ROM, strengthening, and activity tolerance for improved functional mobility skills with transition to HHPT at discharge.  Recommend new W/C as per family W/C at home is not fitted appropriately, and has a sagging seat; pt spends most of her day in her W/C and would benefit from a new W/C fitted appropriately.      Follow Up Recommendations Home health PT    Equipment Recommendations  Wheelchair (measurements PT)       Precautions / Restrictions Precautions Precautions: Fall Precaution Comments: Parkinson's Disease Restrictions Weight Bearing Restrictions: No      Mobility  Bed Mobility Overal bed mobility: Needs Assistance Bed Mobility: Supine to Sit;Sit to Supine     Supine to sit: HOB elevated;Min assist;Mod assist Sit to supine: Max assist   General bed mobility comments: Pt able to maintain sitting at EOB with (B) UE support  Transfers Overall transfer level: Needs assistance Equipment used: Rolling walker (2 wheeled) Transfers: Sit to/from Stand Sit to Stand:  Max assist         General transfer comment: Max assist to transfer to standing and min assist to maintain standing once bodyweight shifted onto feet.   Ambulation/Gait             General Gait Details: Unable to amb at this time as pt unable to clear Lt foot enough for forward propulsion.  Pt was able to side step to  the Lt with assist for movement of RW, and min assist to maintain balance.      Balance Overall balance assessment: Needs assistance Sitting-balance support: Feet unsupported;Bilateral upper extremity supported Sitting balance-Leahy Scale: Fair     Standing balance support: Bilateral upper extremity supported;During functional activity Standing balance-Leahy Scale: Poor                               Pertinent Vitals/Pain Pain Assessment: No/denies pain    Home Living Family/patient expects to be discharged to:: Private residence Living Arrangements: Alone Available Help at Discharge: Family;Personal care attendant;Available 24 hours/day Type of Home: House Home Access: Ramped entrance     Home Layout: One level Home Equipment: Bedside commode;Wheelchair - manual;Hospital bed;Grab bars - tub/shower;Shower seat - built in;Hand held Lobbyist) Additional Comments: Walk In Shower    Prior Function Level of Independence: Needs assistance   Gait / Transfers Assistance Needed: Assist required with transfers and ambulation secondary to posterior lean  ADL's / Homemaking Assistance Needed: Assist required with dressing, bathing, cooking and cutting up food (pt able to feed herself)           Extremity/Trunk Assessment               Lower Extremity Assessment: RLE deficits/detail;LLE deficits/detail;Generalized weakness RLE Deficits / Details: Decreased knee extension, inversion of foot LLE Deficits / Details: Decreased knee extension, inversion of foot     Communication   Communication: No difficulties  Cognition Arousal/Alertness: Awake/alert Behavior During Therapy: WFL for tasks assessed/performed                                 Assessment/Plan    PT Assessment Patient needs continued PT services  PT Diagnosis Difficulty walking;Generalized weakness   PT Problem List Decreased strength;Decreased range of  motion;Decreased activity tolerance;Decreased balance;Decreased mobility  PT Treatment Interventions Balance training;Gait training;Functional mobility training;Therapeutic exercise;Therapeutic activities;Patient/family education;Manual techniques;Neuromuscular re-education   PT Goals (Current goals can be found in the Care Plan section) Acute Rehab PT Goals Patient Stated Goal: improve mobility  PT Goal Formulation: With family Time For Goal Achievement: 07/01/14 Potential to Achieve Goals: Fair    Frequency Min 3X/week        End of Session Equipment Utilized During Treatment: Gait belt Activity Tolerance: Patient limited by fatigue Patient left: in bed;with call bell/phone within reach;with bed alarm set;with family/visitor present           Time: 1500-1530 PT Time Calculation (min): 30 min   Charges:   PT Evaluation $Initial PT Evaluation Tier I: 1 Procedure          Rohit Deloria 06/17/2014, 3:50 PM

## 2014-06-17 NOTE — Care Management Note (Addendum)
    Page 1 of 2   06/18/2014     1:36:05 PM CARE MANAGEMENT NOTE 06/18/2014  Patient:  [name],[name]   Account Number:  [id number]  Date Initiated:  06/17/2014  Documentation initiated by:  Kathyrn Sheriff  Subjective/Objective Assessment:   Patient admitted with UTI. Pt lives at home with 24/7 supervision from family and private duty aids. Pt has a walker that she uses when moving around the house. Pt has no medication needs prior to admission. Pt has in the past needed HH PT.     Action/Plan:   Pt has used [facility] in the past and would like to use them again if needed. Pt will have PT eval prior to discharge. Pt plans to discahrge home with resumption of 24hr supervision. Will continue to follow for CM needs.   Anticipated DC Date:  06/19/2014   Anticipated DC Plan:  HOME/SELF CARE      DC Planning Services  CM consult      PAC Choice  DURABLE MEDICAL EQUIPMENT  HOME HEALTH   Choice offered to / List presented to:  C-1 Patient   DME arranged  WHEELCHAIR - MANUAL      DME agency  [facility]     HH arranged  HH-2 PT      [facility] agency  [facility]   Status of service:  In process, will continue to follow Medicare Important Message given?   (If response is "NO", the following Medicare IM given date fields will be blank) Date Medicare IM given:   Medicare IM given by:   Date Additional Medicare IM given:   Additional Medicare IM given by:    Discharge Disposition:  HOME/SELF CARE  Per UR Regulation:    If discussed at Long Length of Stay Meetings, dates discussed:    Comments:  06/18/2014 1300 Kathyrn Sheriff, RN, MSN, PCCN Pt plans to discharge home tomorrow. HH PT has been ordered. [facility] is pt's agency of choice for [facility] and DME. Alroy Bailiff of [facility] has been notified of referral and will obtain pt's information from chart. Emma, of [facility], has been notified of order for wheelchair. Wheelchair will be delivered to pt's home per pt's  request. No further CM needs at this time.  06/17/2014 1500 Kathyrn Sheriff, RN, MSN, [facility]

## 2014-06-17 NOTE — Progress Notes (Signed)
PT Cancellation Note  Patient Details Name: Trenee Igoe MRN: [id number] DOB: July 11, 1923   Cancelled Treatment:    Reason Eval/Treat Not Completed: Fatigue/lethargy limiting ability to participate  Received order, and chart reviewed.  Upon entering room, family informed PT that pt extremely lethargic from medication (ativan) given last night and has been unable to be aroused to eat breakfast.  Family requested hold on PT evaluation at this time as pt is resting comfortably.  Hx received from family.   Will re-attempt evaluation as time allows.    Topacio Cella 06/17/2014, 10:36 AM

## 2014-06-17 NOTE — Clinical Documentation Improvement (Signed)
  Patient with UTI, AKI, Parkinsons admitted with "confusion", "delirious", "altered mental status". In the Coding world these terms are considered nonspecific and low weighted. If possible, please clarify to reflect severity of illness and risk of mortality. Thank you.   Possible Clinical Conditions?  - Encephalopathy (describe type if known)                       Anoxic                       Septic                       Alcoholic                        Hepatic                       Hypertensive                       Metabolic                       Toxic - Traumatic brain hemorrhage - Hyponatremia / Hypernatremia - Other Condition  Thank You, Beverley Fiedler ,RN Clinical Documentation Specialist:  [phone]  [facility]- Health Information Management

## 2014-06-18 LAB — BASIC METABOLIC PANEL
Anion gap: 13 (ref 5–15)
BUN: 13 mg/dL (ref 6–23)
CALCIUM: 8.5 mg/dL (ref 8.4–10.5)
CO2: 19 meq/L (ref 19–32)
CREATININE: 1 mg/dL (ref 0.50–1.10)
Chloride: 109 mEq/L (ref 96–112)
GFR calc Af Amer: 56 mL/min — ABNORMAL LOW (ref >90)
GFR, EST NON AFRICAN AMERICAN: 48 mL/min — AB (ref >90)
GLUCOSE: 89 mg/dL (ref 70–99)
Potassium: 4.2 mEq/L (ref 3.7–5.3)
Sodium: 141 mEq/L (ref 137–147)

## 2014-06-18 NOTE — Progress Notes (Signed)
Subjective: [name] is awake and comfortable appearing this morning. She's had no fever.  Objective: Vital signs in last 24 hours: Filed Vitals:   06/17/14 0531 06/17/14 1345 06/17/14 2137 06/18/14 0540  BP: 120/57 120/60 141/66 170/69  Pulse: 82 80 82   Temp: 98 F (36.7 C) 98 F (36.7 C) 98.2 F (36.8 C) 98.2 F (36.8 C)  TempSrc: Axillary Oral Oral Oral  Resp: Height:      Weight: 144 lb 13.5 oz (65.7 kg)   143 lb 11.8 oz (65.2 kg)  SpO2: 100% 100% 98% 100%   Weight change: -1 lb 1.6 oz (-0.5 kg)  Intake/Output Summary (Last 24 hours) at 06/18/14 0741 Last data filed at 06/17/14 1900  Gross per 24 hour  Intake 3188.33 ml  Output      0 ml  Net 3188.33 ml    Physical Exam: No distress. Lungs clear. Heart regular with no murmurs. Abdomen soft and nontender.  Lab Results:    Results for orders placed during the hospital encounter of 06/15/14 (from the past 24 hour(s))  BASIC METABOLIC PANEL     Status: Abnormal   Collection Time    06/18/14  5:57 AM      Result Value Ref Range   Sodium 141  137 - 147 mEq/L   Potassium 4.2  3.7 - 5.3 mEq/L   Chloride 109  96 - 112 mEq/L   CO2 19  19 - 32 mEq/L   Glucose, Bld 89  70 - 99 mg/dL   BUN 13  6 - 23 mg/dL   Creatinine, Ser 2.44  0.50 - 1.10 mg/dL   Calcium 8.5  8.4 - 01.0 mg/dL   GFR calc non Af Amer 48 (*) >90 mL/min   GFR calc Af Amer 56 (*) >90 mL/min   Anion gap 13  5 - 15     ABGS No results found for this basename: PHART, PCO2, PO2ART, TCO2, HCO3,  in the last 72 hours CULTURES No results found for this or any previous visit (from the past 240 hour(s)). Studies/Results: Dg Neck Soft Tissue  06/16/2014   CLINICAL DATA:  Right shoulder and neck pain status post fall 3 days ago.  EXAM: NECK SOFT TISSUES - 1+ VIEW  COMPARISON:  None.  FINDINGS: The anterior view is non diagnostic. On the lateral film the soft tissues of the neck through C4 appear normal. C1 through C4 also are grossly normal. The  visualized portions of the mandible are intact.  IMPRESSION: The limited view of the cervical soft tissues reveals no acute abnormality through C4.   Electronically Signed   By: David  [place]   On: 06/16/2014 13:24   Dg Shoulder 1v Right  06/16/2014   CLINICAL DATA:  Right shoulder and neck pain status post fall 3 days ago  EXAM: RIGHT SHOULDER - 1 VIEW  COMPARISON:  None.  FINDINGS: A single portable right shoulder film is submitted. The bones are osteopenic. The glenohumeral joint is grossly normal. There is mild degenerative change of the [facility] joint. The observed portions of the right clavicle and upper right ribs are normal.  IMPRESSION: There is no acute bony abnormality of the right shoulder on this single view.   Electronically Signed   By: David  [place]   On: 06/16/2014 13:23   Micro Results: No results found for this or any previous visit (from the past 240 hour(s)). Studies/Results: Dg Neck Soft Tissue  06/16/2014  CLINICAL DATA:  Right shoulder and neck pain status post fall 3 days ago.  EXAM: NECK SOFT TISSUES - 1+ VIEW  COMPARISON:  None.  FINDINGS: The anterior view is non diagnostic. On the lateral film the soft tissues of the neck through C4 appear normal. C1 through C4 also are grossly normal. The visualized portions of the mandible are intact.  IMPRESSION: The limited view of the cervical soft tissues reveals no acute abnormality through C4.   Electronically Signed   By: David  [place]   On: 06/16/2014 13:24   Dg Shoulder 1v Right  06/16/2014   CLINICAL DATA:  Right shoulder and neck pain status post fall 3 days ago  EXAM: RIGHT SHOULDER - 1 VIEW  COMPARISON:  None.  FINDINGS: A single portable right shoulder film is submitted. The bones are osteopenic. The glenohumeral joint is grossly normal. There is mild degenerative change of the [facility] joint. The observed portions of the right clavicle and upper right ribs are normal.  IMPRESSION: There is no acute bony abnormality of the right shoulder  on this single view.   Electronically Signed   By: David  [place]   On: 06/16/2014 13:23   Medications:  I have reviewed the patient's current medications Scheduled Meds: . acetaminophen  500 mg Oral 3 times per day   Or  . acetaminophen  650 mg Rectal 3 times per day  . carbidopa-levodopa  2 tablet Oral TID AC  . cefTRIAXone (ROCEPHIN)  IV  1 g Intravenous Q24H  . heparin  5,000 Units Subcutaneous 3 times per day  . mirtazapine  15 mg Oral QHS  . pantoprazole  40 mg Oral Daily  . PARoxetine  10 mg Oral q morning - 10a   Continuous Infusions: . sodium chloride 75 mL/hr at 06/17/14 2226   PRN Meds:.acetaminophen, albuterol, alum & mag hydroxide-simeth, guaiFENesin-dextromethorphan, ondansetron (ZOFRAN) IV, ondansetron, oxymetazoline, traZODone   Assessment/Plan: #1. UTI. It appears a urine culture was not obtained on admission. Continue Rocephin. #2. Acute renal insufficiency/dehydration. Creatinine has normalized to 1.0. Electrolytes are normal. Will reduce her IV fluid rate to 50 cc an hour. #3. Parkinson's disease. Continue Sinemet. Principal Problem:   UTI (urinary tract infection) Active Problems:   Parkinson disease   Acute renal failure   Hyponatremia   Metabolic acidosis   Anemia, unspecified     LOS: 3 days   Saraann Enneking 06/18/2014, 7:41 AM

## 2014-06-19 MED ORDER — CIPROFLOXACIN HCL 250 MG PO TABS: 250.0000 mg | ORAL_TABLET | Freq: Two times a day (BID) | ORAL | Status: DC

## 2014-06-19 NOTE — Discharge Summary (Signed)
Physician Discharge Summary  [name] BJY:[id number] DOB: 03/01/1923 DOA: 06/15/2014   Admit date: 06/15/2014 Discharge date: 06/19/2014  Discharge Diagnoses:  Principal Problem:   UTI (urinary tract infection) Active Problems:   Parkinson disease   Acute renal failure   Hyponatremia   Metabolic acidosis   Anemia, unspecified    Wt Readings from Last 3 Encounters:  06/19/14 144 lb 6.4 oz (65.5 kg)  07/12/13 126 lb (57.153 kg)  05/31/13 126 lb (57.153 kg)     Hospital Course:  This patient is an 80 year old female who was admitted with altered mental status and weakness. She had too numerous to count white cells in her urine. She was dehydrated with an elevation in her BUN and creatinine above baseline. She was treated with Rocephin and IV fluids. Her mental status returned to baseline. She is able to eat and drink well. She's had no fever. She initially had a leukocytosis which normalized with therapy.  She had a mild hyponatremia which normalized with fluids. Her Parkinson's disease has been treated with Sinemet.  Her condition is now much improved and she is stable for discharge home. She receives excellent care at home with her family.  Medications have been reviewed with her daughter. She will continue antibiotic therapy with Cipro twice a day for another 7 days. Trimethoprim will be held until she finishes Cipro and then restarted at 100 mg daily. Home health will assist.   Discharge Instructions     Medication List    STOP taking these medications       amoxicillin 250 MG capsule  Commonly known as:  AMOXIL     NASAL SPRAY 12 HOUR 0.05 % nasal spray  Generic drug:  oxymetazoline      TAKE these medications       acetaminophen 325 MG tablet  Commonly known as:  TYLENOL  Take 650 mg by mouth 2 (two) times daily. Also given as needed for mild pain in addition to twice daily     amitriptyline 10 MG tablet  Commonly known as:  ELAVIL  Take 10 mg by mouth at  bedtime.     carbidopa-levodopa 25-100 MG per tablet  Commonly known as:  SINEMET IR  Take 2 tablets by mouth 3 (three) times daily. Before meals     ciprofloxacin 250 MG tablet  Commonly known as:  CIPRO  Take 1 tablet (250 mg total) by mouth 2 (two) times daily.     LORazepam 0.5 MG tablet  Commonly known as:  ATIVAN  Take 1 tablet (0.5 mg total) by mouth every 6 (six) hours as needed for anxiety.     mirtazapine 15 MG tablet  Commonly known as:  REMERON  Take 15 mg by mouth at bedtime.     omeprazole 10 MG capsule  Commonly known as:  PRILOSEC  Take 10 mg by mouth every morning.     PARoxetine 10 MG tablet  Commonly known as:  PAXIL  Take 10 mg by mouth every morning.     traMADol 50 MG tablet  Commonly known as:  ULTRAM  Take 50 mg by mouth daily as needed for pain.     trimethoprim 100 MG tablet  Commonly known as:  TRIMPEX  Take 100 mg by mouth at bedtime.         Filippo Puls 06/19/2014

## 2014-06-20 NOTE — Progress Notes (Signed)
UR review complete.  

## 2014-06-21 ENCOUNTER — Emergency Department (HOSPITAL_COMMUNITY): Payer: Medicare Other

## 2014-06-21 ENCOUNTER — Encounter (HOSPITAL_COMMUNITY): Payer: Self-pay | Admitting: Emergency Medicine

## 2014-06-21 ENCOUNTER — Emergency Department (HOSPITAL_COMMUNITY)
Admission: EM | Admit: 2014-06-21 | Discharge: 2014-06-22 | Disposition: A | Discharge status: Home / Self Care | Payer: Medicare Other | Attending: Emergency Medicine | Admitting: Emergency Medicine

## 2014-06-21 DIAGNOSIS — Z79899 Other long term (current) drug therapy: Secondary | ICD-10-CM | POA: Diagnosis not present

## 2014-06-21 DIAGNOSIS — Z862 Personal history of diseases of the blood and blood-forming organs and certain disorders involving the immune mechanism: Secondary | ICD-10-CM | POA: Diagnosis not present

## 2014-06-21 DIAGNOSIS — R197 Diarrhea, unspecified: Secondary | ICD-10-CM | POA: Insufficient documentation

## 2014-06-21 DIAGNOSIS — Z8739 Personal history of other diseases of the musculoskeletal system and connective tissue: Secondary | ICD-10-CM | POA: Diagnosis not present

## 2014-06-21 DIAGNOSIS — R112 Nausea with vomiting, unspecified: Secondary | ICD-10-CM | POA: Insufficient documentation

## 2014-06-21 DIAGNOSIS — R0602 Shortness of breath: Secondary | ICD-10-CM | POA: Insufficient documentation

## 2014-06-21 DIAGNOSIS — F329 Major depressive disorder, single episode, unspecified: Secondary | ICD-10-CM | POA: Insufficient documentation

## 2014-06-21 DIAGNOSIS — K219 Gastro-esophageal reflux disease without esophagitis: Secondary | ICD-10-CM | POA: Diagnosis not present

## 2014-06-21 DIAGNOSIS — R34 Anuria and oliguria: Secondary | ICD-10-CM | POA: Insufficient documentation

## 2014-06-21 DIAGNOSIS — I509 Heart failure, unspecified: Secondary | ICD-10-CM

## 2014-06-21 DIAGNOSIS — E119 Type 2 diabetes mellitus without complications: Secondary | ICD-10-CM | POA: Insufficient documentation

## 2014-06-21 DIAGNOSIS — Z86711 Personal history of pulmonary embolism: Secondary | ICD-10-CM | POA: Diagnosis not present

## 2014-06-21 DIAGNOSIS — D649 Anemia, unspecified: Secondary | ICD-10-CM | POA: Diagnosis not present

## 2014-06-21 DIAGNOSIS — I10 Essential (primary) hypertension: Secondary | ICD-10-CM | POA: Insufficient documentation

## 2014-06-21 DIAGNOSIS — Z8639 Personal history of other endocrine, nutritional and metabolic disease: Secondary | ICD-10-CM | POA: Diagnosis not present

## 2014-06-21 DIAGNOSIS — F3289 Other specified depressive episodes: Secondary | ICD-10-CM | POA: Diagnosis not present

## 2014-06-21 NOTE — ED Notes (Signed)
Pt c/o increased swelling to lower leg and abd and feels sob when up moving around.

## 2014-06-21 NOTE — ED Provider Notes (Signed)
CSN: [id number]     Arrival date & time 06/21/14  2225 History   First MD Initiated Contact with Patient 06/21/14 2300    This chart was scribed for Dione Booze, MD by Marica Otter, ED Scribe. This patient was seen in room APA07/APA07 and the patient's care was started at 11:12 PM.  Chief Complaint  Patient presents with  . Shortness of Breath   The history is provided by the patient and a relative. No language interpreter was used.   PCP: Carylon Perches, MD HPI Comments: [name] is a 80 y.o. female, with medical Hx noted below and significant for HTN, parkinson disease, GERD, PE and DM, who presents to the Emergency Department complaining of swelling to abd, legs and feet. Family member denies n/v/d, cough, SOB (though pt notes that she had SOB last night which has now resolved), or chest pain. Per family members, pt has no history of similar Sx.   Past Medical History  Diagnosis Date  . Hypertension   . GERD (gastroesophageal reflux disease)   . Parkinson disease   . Depression   . Pulmonary embolism   . Arthritis   . Hypercalcemia   . Diabetes mellitus     family denies   Past Surgical History  Procedure Laterality Date  . Esophagogastroduodenoscopy  06/10/2011    Procedure: ESOPHAGOGASTRODUODENOSCOPY (EGD);  Surgeon: Malissa Hippo, MD;  Location: [facility];  Service: Endoscopy;  Laterality: N/A;  . Vena cava filter placement    . Esophagogastroduodenoscopy  07/29/2011    Procedure: ESOPHAGOGASTRODUODENOSCOPY (EGD);  Surgeon: Malissa Hippo, MD;  Location: [facility];  Service: Endoscopy;  Laterality: N/A;  . Laparoscopic nissen fundoplication  08/09/2011    Procedure: LAPAROSCOPIC NISSEN FUNDOPLICATION;  Surgeon: Fabio Bering;  Location: [facility] OR[facility];  Service: General;  Laterality: N/A;  Laparoscopic Hiatal Hernia Repair  . Gastrostomy  08/09/2011    Procedure: GASTROSTOMY;  Surgeon: Fabio Bering;  Location: [facility] OR[facility];  Service: General;  Laterality: Left;  Laparoscopic  Gastrostomy Tube placement  . Hernia repair     History reviewed. No pertinent family history. History  Substance Use Topics  . Smoking status: Never Smoker   . Smokeless tobacco: Never Used  . Alcohol Use: No   OB History   Grav Para Term Preterm Abortions TAB SAB Ect Mult Living                 Review of Systems  Constitutional: Negative for fever and chills.  Respiratory: Negative for cough and shortness of breath.   Cardiovascular: Positive for leg swelling. Negative for chest pain.  Gastrointestinal: Positive for nausea, vomiting, diarrhea and abdominal distention (abd swelling).  Genitourinary: Positive for decreased urine volume.  All other systems reviewed and are negative.  Allergies  Reglan and Metoclopramide hcl  Home Medications   Prior to Admission medications   Medication Sig Start Date End Date Taking? Authorizing Provider  acetaminophen (TYLENOL) 325 MG tablet Take 650 mg by mouth 2 (two) times daily. Also given as needed for mild pain in addition to twice daily    Historical Provider, MD  amitriptyline (ELAVIL) 10 MG tablet Take 10 mg by mouth at bedtime.      Historical Provider, MD  carbidopa-levodopa (SINEMET IR) 25-100 MG per tablet Take 2 tablets by mouth 3 (three) times daily. Before meals 02/07/14   Nilda Riggs, NP  ciprofloxacin (CIPRO) 250 MG tablet Take 1 tablet (250 mg total) by mouth 2 (two) times daily.  06/19/14   Carylon Perches, MD  LORazepam (ATIVAN) 0.5 MG tablet Take 1 tablet (0.5 mg total) by mouth every 6 (six) hours as needed for anxiety. 05/09/13   Carylon Perches, MD  mirtazapine (REMERON) 15 MG tablet Take 15 mg by mouth at bedtime.      Historical Provider, MD  omeprazole (PRILOSEC) 10 MG capsule Take 10 mg by mouth every morning.    Historical Provider, MD  PARoxetine (PAXIL) 10 MG tablet Take 10 mg by mouth every morning.    Historical Provider, MD  traMADol (ULTRAM) 50 MG tablet Take 50 mg by mouth daily as needed for pain.    Historical  Provider, MD  trimethoprim (TRIMPEX) 100 MG tablet Take 100 mg by mouth at bedtime. 01/08/13   Historical Provider, MD   Triage Vitals: BP 121/59  Pulse 82  Temp(Src) 98 F (36.7 C) (Oral)  Resp 17  Wt 145 lb (65.772 kg)  SpO2 100% Physical Exam  Nursing note and vitals reviewed. Constitutional: She is oriented to person, place, and time. She appears well-developed and well-nourished. No distress.  HENT:  Head: Normocephalic and atraumatic.  Eyes: Conjunctivae and EOM are normal. Pupils are equal, round, and reactive to light.  Neck: Normal range of motion. Neck supple. No JVD present. No tracheal deviation present.  Cardiovascular: Normal rate.   Pulmonary/Chest: Effort normal and breath sounds normal. She has no wheezes. She has no rales.  Abdominal: Soft. Bowel sounds are normal. She exhibits no distension and no mass. There is no tenderness.  Musculoskeletal: Normal range of motion. She exhibits edema (2+ pitting edema  BLE).  Lymphadenopathy:    She has no cervical adenopathy.  Neurological: She is alert and oriented to person, place, and time. No cranial nerve deficit. Coordination normal.  Oriented to person and place only.   Skin: Skin is warm and dry. No rash noted.  Psychiatric: She has a normal mood and affect. Her behavior is normal.    ED Course  Procedures (including critical care time) DIAGNOSTIC STUDIES: Oxygen Saturation is 100% on RA, nl by my interpretation.    COORDINATION OF CARE: 11:14 PM-Discussed treatment plan which includes EKG, labs with pt at bedside and pt agreed to plan.  Labs Review Results for orders placed during the hospital encounter of 06/21/14  CBC WITH DIFFERENTIAL      Result Value Ref Range   WBC 6.2  4.0 - 10.5 K/uL   RBC 2.96 (*) 3.87 - 5.11 MIL/uL   Hemoglobin 9.7 (*) 12.0 - 15.0 g/dL   HCT 16.1 (*) 09.6 - 04.5 %   MCV 99.0  78.0 - 100.0 fL   MCH 32.8  26.0 - 34.0 pg   MCHC 33.1  30.0 - 36.0 g/dL   RDW 40.9  81.1 - 91.4 %    Platelets 193  150 - 400 K/uL   Neutrophils Relative % 75  43 - 77 %   Neutro Abs 4.6  1.7 - 7.7 K/uL   Lymphocytes Relative 11 (*) 12 - 46 %   Lymphs Abs 0.7  0.7 - 4.0 K/uL   Monocytes Relative 11  3 - 12 %   Monocytes Absolute 0.7  0.1 - 1.0 K/uL   Eosinophils Relative 3  0 - 5 %   Eosinophils Absolute 0.2  0.0 - 0.7 K/uL   Basophils Relative 0  0 - 1 %   Basophils Absolute 0.0  0.0 - 0.1 K/uL  BASIC METABOLIC PANEL  Result Value Ref Range   Sodium 138  137 - 147 mEq/L   Potassium 3.7  3.7 - 5.3 mEq/L   Chloride 106  96 - 112 mEq/L   CO2 19  19 - 32 mEq/L   Glucose, Bld 108 (*) 70 - 99 mg/dL   BUN 6  6 - 23 mg/dL   Creatinine, Ser 1.61  0.50 - 1.10 mg/dL   Calcium 8.6  8.4 - 09.6 mg/dL   GFR calc non Af Amer 75 (*) >90 mL/min   GFR calc Af Amer 87 (*) >90 mL/min   Anion gap 13  5 - 15  PRO B NATRIURETIC PEPTIDE      Result Value Ref Range   Pro B Natriuretic peptide (BNP) 756.5 (*) 0 - 450 pg/mL  TROPONIN I      Result Value Ref Range   Troponin I <0.30  <0.30 ng/mL  Imaging Review Dg Chest 2 View  06/22/2014   CLINICAL DATA:  Bilateral extremity swelling.  EXAM: CHEST  2 VIEW  COMPARISON:  06/15/2014.  FINDINGS: Heart is mildly enlarged. A large hiatal hernia is noted. The lung volumes are low. Overall aeration is improved. Mild elevation of the right hemidiaphragm is again noted. The visualized soft tissues and bony thorax are unremarkable.  IMPRESSION: 1. Improved aeration in both lungs. 2. Persistent low lung volumes with stable elevation of the right hemidiaphragm. 3. No acute cardiopulmonary disease.   Electronically Signed   By: Gennette Pac M.D.   On: 06/22/2014 01:02     EKG Interpretation   Date/Time:  Friday June 21 2014 22:34:35 EDT Ventricular Rate:  80 PR Interval:  157 QRS Duration: 86 QT Interval:  400 QTC Calculation: 461 R Axis:   25 Text Interpretation:  Sinus rhythm Low voltage, extremity leads When  compared with ECG of 06/15/2014, No  significant change was found Confirmed  by [facility]  MD, Sheelah Ritacco ([id number]) on 06/21/2014 11:16:40 PM      MDM   Final diagnoses:  Congestive heart failure, unspecified congestive heart failure chronicity, unspecified congestive heart failure type  Normocytic normochromic anemia    Report of dyspnea with associated peripheral edema suggestive of fluid overload and possible congestive heart failure. Old records are reviewed and she had recently been admitted to the hospital. She did have admitted the creatinine on admission which had improved. In the ED, her BMP has come back slightly elevated at 756 and creatinine is asked improved over discharge diet. Chest x-ray shows no evidence of pulmonary edema. At rest, she is maintaining adequate oxygen saturation and is not in any distress. That she can safely be treated at home. He was given a dose of furosemide in the ED and is discharged with prescription of furosemide for the next 5 days. She is to follow up with her PCP, who can decide if she needs ongoing use of diuretics.  I personally performed the services described in this documentation, which was scribed in my presence. The recorded information has been reviewed and is accurate.     Dione Booze, MD 06/22/14 [phone]

## 2014-06-22 LAB — CBC WITH DIFFERENTIAL/PLATELET
Basophils Absolute: 0 10*3/uL (ref 0.0–0.1)
Basophils Relative: 0 % (ref 0–1)
EOS ABS: 0.2 10*3/uL (ref 0.0–0.7)
Eosinophils Relative: 3 % (ref 0–5)
HCT: 29.3 % — ABNORMAL LOW (ref 36.0–46.0)
HEMOGLOBIN: 9.7 g/dL — AB (ref 12.0–15.0)
LYMPHS ABS: 0.7 10*3/uL (ref 0.7–4.0)
Lymphocytes Relative: 11 % — ABNORMAL LOW (ref 12–46)
MCH: 32.8 pg (ref 26.0–34.0)
MCHC: 33.1 g/dL (ref 30.0–36.0)
MCV: 99 fL (ref 78.0–100.0)
Monocytes Absolute: 0.7 10*3/uL (ref 0.1–1.0)
Monocytes Relative: 11 % (ref 3–12)
NEUTROS PCT: 75 % (ref 43–77)
Neutro Abs: 4.6 10*3/uL (ref 1.7–7.7)
Platelets: 193 10*3/uL (ref 150–400)
RBC: 2.96 MIL/uL — AB (ref 3.87–5.11)
RDW: 15.2 % (ref 11.5–15.5)
WBC: 6.2 10*3/uL (ref 4.0–10.5)

## 2014-06-22 LAB — PRO B NATRIURETIC PEPTIDE: Pro B Natriuretic peptide (BNP): 756.5 pg/mL — ABNORMAL HIGH (ref 0–450)

## 2014-06-22 LAB — BASIC METABOLIC PANEL
Anion gap: 13 (ref 5–15)
BUN: 6 mg/dL (ref 6–23)
CO2: 19 mEq/L (ref 19–32)
Calcium: 8.6 mg/dL (ref 8.4–10.5)
Chloride: 106 mEq/L (ref 96–112)
Creatinine, Ser: 0.69 mg/dL (ref 0.50–1.10)
GFR calc Af Amer: 87 mL/min — ABNORMAL LOW (ref >90)
GFR, EST NON AFRICAN AMERICAN: 75 mL/min — AB (ref >90)
GLUCOSE: 108 mg/dL — AB (ref 70–99)
Potassium: 3.7 mEq/L (ref 3.7–5.3)
Sodium: 138 mEq/L (ref 137–147)

## 2014-06-22 LAB — TROPONIN I

## 2014-06-22 MED ORDER — IBUPROFEN 800 MG PO TABS: 800.0000 mg | ORAL_TABLET | Freq: Once | ORAL | Status: DC

## 2014-06-22 MED ORDER — FUROSEMIDE 20 MG PO TABS: 20.0000 mg | ORAL_TABLET | Freq: Every day | ORAL | Status: DC

## 2014-06-22 MED ORDER — FUROSEMIDE 10 MG/ML IJ SOLN
40.0000 mg | Freq: Once | INTRAMUSCULAR | Status: AC
  Administered 2014-06-22: 40 mg via INTRAVENOUS

## 2014-06-22 MED ORDER — FUROSEMIDE 10 MG/ML IJ SOLN
INTRAMUSCULAR | Status: DC
Start: 2014-06-22 — End: 2014-06-22
  Filled 2014-06-22: qty 4

## 2014-06-22 NOTE — Discharge Instructions (Signed)
Peripheral Edema You have swelling in your legs (peripheral edema). This swelling is due to excess accumulation of salt and water in your body. Edema may be a sign of heart, kidney or liver disease, or a side effect of a medication. It may also be due to problems in the leg veins. Elevating your legs and using special support stockings may be very helpful, if the cause of the swelling is due to poor venous circulation. Avoid long periods of standing, whatever the cause. Treatment of edema depends on identifying the cause. Chips, pretzels, pickles and other salty foods should be avoided. Restricting salt in your diet is almost always needed. Water pills (diuretics) are often used to remove the excess salt and water from your body via urine. These medicines prevent the kidney from reabsorbing sodium. This increases urine flow. Diuretic treatment may also result in lowering of potassium levels in your body. Potassium supplements may be needed if you have to use diuretics daily. Daily weights can help you keep track of your progress in clearing your edema. You should call your caregiver for follow up care as recommended. SEEK IMMEDIATE MEDICAL CARE IF:   You have increased swelling, pain, redness, or heat in your legs.  You develop shortness of breath, especially when lying down.  You develop chest or abdominal pain, weakness, or fainting.  You have a fever. Document Released: 11/18/2004 Document Revised: 01/03/2012 Document Reviewed: 10/29/2009 [facility] Patient Information 2015 [place], [place]. This information is not intended to replace advice given to you by your health care provider. Make sure you discuss any questions you have with your health care provider.  Furosemide tablets What is this medicine? FUROSEMIDE (fyoor [place] se mide) is a diuretic. It helps you make more urine and to lose salt and excess water from your body. This medicine is used to treat high blood pressure, and edema or swelling  from heart, kidney, or liver disease. This medicine may be used for other purposes; ask your health care provider or pharmacist if you have questions. COMMON BRAND NAME(S): Delone, Lasix What should I tell my health care provider before I take this medicine? They need to know if you have any of these conditions: -abnormal blood electrolytes -diarrhea or vomiting -gout -heart disease -kidney disease, small amounts of urine, or difficulty passing urine -liver disease -an unusual or allergic reaction to furosemide, sulfa drugs, other medicines, foods, dyes, or preservatives -pregnant or trying to get pregnant -breast-feeding How should I use this medicine? Take this medicine by mouth with a glass of water. Follow the directions on the prescription label. You may take this medicine with or without food. If it upsets your stomach, take it with food or milk. Do not take your medicine more often than directed. Remember that you will need to pass more urine after taking this medicine. Do not take your medicine at a time of day that will cause you problems. Do not take at bedtime. Talk to your pediatrician regarding the use of this medicine in children. While this drug may be prescribed for selected conditions, precautions do apply. Overdosage: If you think you have taken too much of this medicine contact a poison control center or emergency room at once. NOTE: This medicine is only for you. Do not share this medicine with others. What if I miss a dose? If you miss a dose, take it as soon as you can. If it is almost time for your next dose, take only that dose. Do not take  double or extra doses. What may interact with this medicine? -aspirin and aspirin-like medicines -certain antibiotics -chloral hydrate -cisplatin -cyclosporine -digoxin -diuretics -laxatives -lithium -medicines for blood pressure -medicines that relax muscles for surgery -methotrexate -NSAIDs, medicines for pain and  inflammation like ibuprofen, naproxen, or indomethacin -phenytoin -steroid medicines like prednisone or cortisone -sucralfate This list may not describe all possible interactions. Give your health care provider a list of all the medicines, herbs, non-prescription drugs, or dietary supplements you use. Also tell them if you smoke, drink alcohol, or use illegal drugs. Some items may interact with your medicine. What should I watch for while using this medicine? Visit your doctor or health care professional for regular checks on your progress. Check your blood pressure regularly. Ask your doctor or health care professional what your blood pressure should be, and when you should contact him or her. If you are a diabetic, check your blood sugar as directed. You may need to be on a special diet while taking this medicine. Check with your doctor. Also, ask how many glasses of fluid you need to drink a day. You must not get dehydrated. You may get drowsy or dizzy. Do not drive, use machinery, or do anything that needs mental alertness until you know how this drug affects you. Do not stand or sit up quickly, especially if you are an older patient. This reduces the risk of dizzy or fainting spells. Alcohol can make you more drowsy and dizzy. Avoid alcoholic drinks. This medicine can make you more sensitive to the sun. Keep out of the sun. If you cannot avoid being in the sun, wear protective clothing and use sunscreen. Do not use sun lamps or tanning beds/booths. What side effects may I notice from receiving this medicine? Side effects that you should report to your doctor or health care professional as soon as possible: -blood in urine or stools -dry mouth -fever or chills -hearing loss or ringing in the ears -irregular heartbeat -muscle pain or weakness, cramps -skin rash -stomach upset, pain, or nausea -tingling or numbness in the hands or feet -unusually weak or tired -vomiting or  diarrhea -yellowing of the eyes or skin Side effects that usually do not require medical attention (report to your doctor or health care professional if they continue or are bothersome): -headache -loss of appetite -unusual bleeding or bruising This list may not describe all possible side effects. Call your doctor for medical advice about side effects. You may report side effects to FDA at 1-800-FDA-1088. Where should I keep my medicine? Keep out of the reach of children. Store at room temperature between 15 and 30 degrees C (59 and 86 degrees F). Protect from light. Throw away any unused medicine after the expiration date. NOTE: This sheet is a summary. It may not cover all possible information. If you have questions about this medicine, talk to your doctor, pharmacist, or health care provider.  2015, [employer] Standard. (2009-09-29 16:24:50)

## 2014-06-24 ENCOUNTER — Encounter (HOSPITAL_COMMUNITY): Payer: Self-pay | Admitting: Emergency Medicine

## 2014-06-24 ENCOUNTER — Emergency Department (HOSPITAL_COMMUNITY): Payer: Medicare Other

## 2014-06-24 ENCOUNTER — Emergency Department (HOSPITAL_COMMUNITY)
Admission: EM | Admit: 2014-06-24 | Discharge: 2014-06-24 | Disposition: A | Discharge status: Home / Self Care | Payer: Medicare Other | Attending: Emergency Medicine | Admitting: Emergency Medicine

## 2014-06-24 DIAGNOSIS — Z792 Long term (current) use of antibiotics: Secondary | ICD-10-CM | POA: Insufficient documentation

## 2014-06-24 DIAGNOSIS — I824Y9 Acute embolism and thrombosis of unspecified deep veins of unspecified proximal lower extremity: Secondary | ICD-10-CM | POA: Insufficient documentation

## 2014-06-24 DIAGNOSIS — E119 Type 2 diabetes mellitus without complications: Secondary | ICD-10-CM | POA: Diagnosis not present

## 2014-06-24 DIAGNOSIS — F329 Major depressive disorder, single episode, unspecified: Secondary | ICD-10-CM | POA: Diagnosis not present

## 2014-06-24 DIAGNOSIS — F3289 Other specified depressive episodes: Secondary | ICD-10-CM | POA: Diagnosis not present

## 2014-06-24 DIAGNOSIS — G3183 Dementia with Lewy bodies: Secondary | ICD-10-CM

## 2014-06-24 DIAGNOSIS — M7989 Other specified soft tissue disorders: Secondary | ICD-10-CM | POA: Diagnosis present

## 2014-06-24 DIAGNOSIS — M129 Arthropathy, unspecified: Secondary | ICD-10-CM | POA: Diagnosis not present

## 2014-06-24 DIAGNOSIS — F0280 Dementia in other diseases classified elsewhere without behavioral disturbance: Secondary | ICD-10-CM | POA: Diagnosis not present

## 2014-06-24 DIAGNOSIS — G20 Parkinson's disease: Secondary | ICD-10-CM | POA: Diagnosis not present

## 2014-06-24 DIAGNOSIS — Z79899 Other long term (current) drug therapy: Secondary | ICD-10-CM | POA: Insufficient documentation

## 2014-06-24 DIAGNOSIS — Z86711 Personal history of pulmonary embolism: Secondary | ICD-10-CM | POA: Diagnosis not present

## 2014-06-24 DIAGNOSIS — I82402 Acute embolism and thrombosis of unspecified deep veins of left lower extremity: Secondary | ICD-10-CM

## 2014-06-24 DIAGNOSIS — J811 Chronic pulmonary edema: Secondary | ICD-10-CM | POA: Diagnosis not present

## 2014-06-24 DIAGNOSIS — K219 Gastro-esophageal reflux disease without esophagitis: Secondary | ICD-10-CM | POA: Diagnosis not present

## 2014-06-24 DIAGNOSIS — G20A1 Parkinson's disease without dyskinesia, without mention of fluctuations: Secondary | ICD-10-CM | POA: Insufficient documentation

## 2014-06-24 LAB — BASIC METABOLIC PANEL
Anion gap: 15 (ref 5–15)
BUN: 12 mg/dL (ref 6–23)
CHLORIDE: 101 meq/L (ref 96–112)
CO2: 18 meq/L — AB (ref 19–32)
Calcium: 8.1 mg/dL — ABNORMAL LOW (ref 8.4–10.5)
Creatinine, Ser: 1.01 mg/dL (ref 0.50–1.10)
GFR calc Af Amer: 55 mL/min — ABNORMAL LOW (ref >90)
GFR calc non Af Amer: 48 mL/min — ABNORMAL LOW (ref >90)
Glucose, Bld: 130 mg/dL — ABNORMAL HIGH (ref 70–99)
Potassium: 3.9 mEq/L (ref 3.7–5.3)
Sodium: 134 mEq/L — ABNORMAL LOW (ref 137–147)

## 2014-06-24 LAB — CBC WITH DIFFERENTIAL/PLATELET
Basophils Absolute: 0 10*3/uL (ref 0.0–0.1)
Basophils Relative: 0 % (ref 0–1)
Eosinophils Absolute: 0.1 10*3/uL (ref 0.0–0.7)
Eosinophils Relative: 1 % (ref 0–5)
HEMATOCRIT: 27.3 % — AB (ref 36.0–46.0)
HEMOGLOBIN: 8.8 g/dL — AB (ref 12.0–15.0)
LYMPHS PCT: 9 % — AB (ref 12–46)
Lymphs Abs: 0.8 10*3/uL (ref 0.7–4.0)
MCH: 32.1 pg (ref 26.0–34.0)
MCHC: 32.2 g/dL (ref 30.0–36.0)
MCV: 99.6 fL (ref 78.0–100.0)
MONOS PCT: 13 % — AB (ref 3–12)
Monocytes Absolute: 1.1 10*3/uL — ABNORMAL HIGH (ref 0.1–1.0)
NEUTROS ABS: 6.7 10*3/uL (ref 1.7–7.7)
Neutrophils Relative %: 77 % (ref 43–77)
Platelets: 129 10*3/uL — ABNORMAL LOW (ref 150–400)
RBC: 2.74 MIL/uL — AB (ref 3.87–5.11)
RDW: 15.6 % — ABNORMAL HIGH (ref 11.5–15.5)
WBC: 8.7 10*3/uL (ref 4.0–10.5)

## 2014-06-24 LAB — APTT: aPTT: 32 seconds (ref 24–37)

## 2014-06-24 LAB — PRO B NATRIURETIC PEPTIDE: Pro B Natriuretic peptide (BNP): 463.1 pg/mL — ABNORMAL HIGH (ref 0–450)

## 2014-06-24 LAB — PROTIME-INR
INR: 1.19 (ref 0.00–1.49)
PROTHROMBIN TIME: 15.1 s (ref 11.6–15.2)

## 2014-06-24 LAB — TROPONIN I

## 2014-06-24 MED ORDER — XARELTO VTE STARTER PACK 15 & 20 MG PO TBPK: 15.0000 mg | ORAL_TABLET | ORAL | Status: DC

## 2014-06-24 MED ORDER — FUROSEMIDE 10 MG/ML IJ SOLN
40.0000 mg | Freq: Once | INTRAMUSCULAR | Status: AC
  Administered 2014-06-24: 40 mg via INTRAVENOUS
  Filled 2014-06-24: qty 4

## 2014-06-24 MED ORDER — IOHEXOL 350 MG/ML SOLN
100.0000 mL | Freq: Once | INTRAVENOUS | Status: AC | PRN
  Administered 2014-06-24: 100 mL via INTRAVENOUS

## 2014-06-24 MED ORDER — RIVAROXABAN 15 MG PO TABS
15.0000 mg | ORAL_TABLET | Freq: Once | ORAL | Status: AC
  Administered 2014-06-24: 15 mg via ORAL
  Filled 2014-06-24: qty 1

## 2014-06-24 MED ORDER — RIVAROXABAN 15 MG PO TABS
ORAL_TABLET | ORAL | Status: AC
  Filled 2014-06-24: qty 1

## 2014-06-24 NOTE — ED Notes (Signed)
Daughter states  Pt was here Wednesday and Friday for edema. Family states lasix has been increased from 20 mg to 40 mg. States there has not been any increase in output and pt's abdomen is swollen

## 2014-06-24 NOTE — ED Provider Notes (Signed)
TIME SEEN: 4:15 PM  CHIEF COMPLAINT: Lower extremity swelling that is worse on the right, shortness of breath with transferring  HPI: Patient is an 80 year old female with history of hypertension, Parkinson's, dementia, pulmonary embolus no longer on anticoagulation status post IVC filter in 2012 who presents to the emergency department from home with lower extremity swelling. Family noticed the right lower extremity has been more swollen than the left. They state they were seen here on Friday, 3 days ago for the same. They were started on Lasix 20 mg daily. They called on Saturday and had this increased to 40 mg daily. They state they have not noticed increase in her urinary output and they do not feel the swelling has improved on the right side but has improved on the left. They state the patient has had some shortness of breath with transferring this morning and over the past several days but she denies any shortness of breath currently. No chest pain. Patient is mostly bedbound at baseline. No fever. No known history of CHF. No cough. History is limited as patient has dementia.   Patient lives with her daughters and has home health aides.   ROS: Level V cavity on for dementia  PAST MEDICAL HISTORY/PAST SURGICAL HISTORY:  Past Medical History  Diagnosis Date  . Hypertension   . GERD (gastroesophageal reflux disease)   . Parkinson disease   . Depression   . Pulmonary embolism   . Arthritis   . Hypercalcemia   . Diabetes mellitus     family denies    MEDICATIONS:  Prior to Admission medications   Medication Sig Start Date End Date Taking? Authorizing Provider  acetaminophen (TYLENOL) 325 MG tablet Take 650 mg by mouth 2 (two) times daily. Also given as needed for mild pain in addition to twice daily   Yes Historical Provider, MD  amitriptyline (ELAVIL) 10 MG tablet Take 10 mg by mouth at bedtime.     Yes Historical Provider, MD  carbidopa-levodopa (SINEMET IR) 25-100 MG per tablet  Take 2 tablets by mouth 3 (three) times daily. Before meals 02/07/14  Yes Nilda Riggs, NP  ciprofloxacin (CIPRO) 250 MG tablet Take 1 tablet (250 mg total) by mouth 2 (two) times daily. 06/19/14  Yes Carylon Perches, MD  furosemide (LASIX) 40 MG tablet Take 40 mg by mouth daily.   Yes Historical Provider, MD  LORazepam (ATIVAN) 0.5 MG tablet Take 1 tablet (0.5 mg total) by mouth every 6 (six) hours as needed for anxiety. 05/09/13  Yes Carylon Perches, MD  mirtazapine (REMERON) 15 MG tablet Take 15 mg by mouth at bedtime.     Yes Historical Provider, MD  omeprazole (PRILOSEC) 10 MG capsule Take 10 mg by mouth every morning.   Yes Historical Provider, MD  PARoxetine (PAXIL) 10 MG tablet Take 10 mg by mouth every morning.   Yes Historical Provider, MD  traMADol (ULTRAM) 50 MG tablet Take 50 mg by mouth daily as needed for pain.   Yes Historical Provider, MD  trimethoprim (TRIMPEX) 100 MG tablet Take 100 mg by mouth at bedtime. 01/08/13  Yes Historical Provider, MD    ALLERGIES:  Allergies  Allergen Reactions  . Reglan [Metoclopramide] Other (See Comments)    Tremors really bad   . Metoclopramide Hcl Other (See Comments)    Affected Nervous system- severe jerkiing, shaking, etc..    SOCIAL HISTORY:  History  Substance Use Topics  . Smoking status: Never Smoker   . Smokeless tobacco: Never Used  .  Alcohol Use: No    FAMILY HISTORY: No family history on file.  EXAM: BP 104/63  Pulse 89  Temp(Src) 98.5 F (36.9 C) (Oral)  Resp 20  SpO2 100% CONSTITUTIONAL: Alert and oriented and responds appropriately to questions. Well-appearing; well-nourished, elderly, pleasant, in no distress HEAD: Normocephalic EYES: Conjunctivae clear, PERRL ENT: normal nose; no rhinorrhea; moist mucous membranes; pharynx without lesions noted NECK: Supple, no meningismus, no LAD  CARD: RRR; S1 and S2 appreciated; no murmurs, no clicks, no rubs, no gallops RESP: Normal chest excursion without splinting or  tachypnea; breath sounds equal bilaterally, no wheezing or rhonchi, patient does have some bibasilar Rales, no hypoxia or respiratory distress ABD/GI: Normal bowel sounds; non-distended; soft, non-tender, no rebound, no guarding BACK:  The back appears normal and is non-tender to palpation, there is no CVA tenderness EXT: Patient has nonpitting edema of her bilateral lower extremities is worse on the right side, the foot is warm and well-perfused with 2+ DP pulses bilaterally, sensation to light touch intact diffusely, no sign of erythema or warmth or induration or fluctuance, no joint effusions, patient does have some bruising to bilateral lower extremities and her abdomen, Normal ROM in all joints; otherwise extremities are non-tender to palpation; no edema; normal capillary refill; no cyanosis    SKIN: Normal color for age and race; warm NEURO: Moves all extremities equally; sensation to light touch intact diffusely PSYCH: The patient's mood and manner are appropriate. Grooming and personal hygiene are appropriate.  MEDICAL DECISION MAKING: Patient here with concerns for lower extremity edema that he feels has not completely resolved. They called their PCP today who instructed them to come to the emergency department because he could not see them in his office today or tomorrow. On exam, patient does have increased swelling in her right extremity compared to her left. She did recently have a hospitalization for generalized weakness and has had a history of a pulmonary embolus. Concern for possible DVT. Will obtain venous Doppler. We'll also obtain cardiac labs including troponin and BNP, chest x-ray given she does have diabetes or rales and some shortness of breath with transferring. She is otherwise hemodynamically stable and in no distress.  ED PROGRESS: Patient's chest x-ray shows some increase in her pulmonary vascular congestion but no edema or infiltrate. Her BNP has improved 463. Troponin  negative. Her hemoglobin is 8.8 which is at her baseline. Venous Doppler did show occlusive thrombus from the popliteal vein to the common femoral vein. There is also superficial thrombophlebitis of the greater saphenous at the knee. Given she has this new diagnosis of DVT and has been complaining of shortness of breath, will obtain a CT angiogram of her chest to rule out pulmonary embolus.     CT scan shows a subacute and chronic pulmonary emboli that are unchanged were slightly improved compared to her initial CT scan in 2012. Reviewed with radiologist. Given the patient has improved and is not hypoxic or tachypnea, and feel she is safe to go home. We'll have him stop using Lasix as it is not going to help with her swelling in her leg because this is secondary to a DVT. We'll start her on Xarelto. Family would prefer this over Coumadin and Lovenox. Will have him followup with her primary care physician. Have given return precautions and supportive care instructions. They verbalize understanding and are comfortable with plan.   EKG Interpretation  Date/Time:  Monday June 24 2014 16:00:03 EDT Ventricular Rate:  86 PR Interval:  131 QRS Duration: 85 QT Interval:  399 QTC Calculation: 477 R Axis:   9 Text Interpretation:  Sinus rhythm Confirmed by WARD,  DO, KRISTEN ([id number]) on 06/24/2014 4:06:50 PM        Layla Maw Ward, DO 06/24/14 [id number]

## 2014-06-24 NOTE — Discharge Instructions (Signed)
You may stop taking her Lasix tablets. The swelling in her leg is secondary to a DVT. Please keep your leg elevated above the level of your heart while at rest and you may wear compression stockings. Please take Xarelto daily. Please followup with her primary care physician.  Deep Vein Thrombosis A deep vein thrombosis (DVT) is a blood clot that develops in the deep, larger veins of the leg, arm, or pelvis. These are more dangerous than clots that might form in veins near the surface of the body. A DVT can lead to serious and even life-threatening complications if the clot breaks off and travels in the bloodstream to the lungs.  A DVT can damage the valves in your leg veins so that instead of flowing upward, the blood pools in the lower leg. This is called post-thrombotic syndrome, and it can result in pain, swelling, discoloration, and sores on the leg. CAUSES Usually, several things contribute to the formation of blood clots. Contributing factors include:  The flow of blood slows down.  The inside of the vein is damaged in some way.  You have a condition that makes blood clot more easily. RISK FACTORS Some people are more likely than others to develop blood clots. Risk factors include:   Smoking.  Being overweight (obese).  Sitting or lying still for a long time. This includes long-distance travel, paralysis, or recovery from an illness or surgery. Other factors that increase risk are:   Older age, especially over 40 years of age.  Having a family history of blood clots or if you have already had a blot clot.  Having major or lengthy surgery. This is especially true for surgery on the hip, knee, or belly (abdomen). Hip surgery is particularly high risk.  Having a long, thin tube (catheter) placed inside a vein during a medical procedure.  Breaking a hip or leg.  Having cancer or cancer treatment.  Pregnancy and childbirth.  Hormone changes make the blood clot more easily during  pregnancy.  The fetus puts pressure on the veins of the pelvis.  There is a risk of injury to veins during delivery or a caesarean delivery. The risk is highest just after childbirth.  Medicines containing the female hormone estrogen. This includes birth control pills and hormone replacement therapy.  Other circulation or heart problems.  SIGNS AND SYMPTOMS When a clot forms, it can either partially or totally block the blood flow in that vein. Symptoms of a DVT can include:  Swelling of the leg or arm, especially if one side is much worse.  Warmth and redness of the leg or arm, especially if one side is much worse.  Pain in an arm or leg. If the clot is in the leg, symptoms may be more noticeable or worse when standing or walking. The symptoms of a DVT that has traveled to the lungs (pulmonary embolism, PE) usually start suddenly and include:  Shortness of breath.  Coughing.  Coughing up blood or blood-tinged mucus.  Chest pain. The chest pain is often worse with deep breaths.  Rapid heartbeat. Anyone with these symptoms should get emergency medical treatment right away. Do not wait to see if the symptoms will go away. Call your local emergency services (911 in the [place] if you have these symptoms. Do not drive yourself to the hospital. DIAGNOSIS If a DVT is suspected, your health care provider will take a full medical history and perform a physical exam. Tests that also may be required include:  Blood tests, including studies of the clotting properties of the blood.  Ultrasound to see if you have clots in your legs or lungs.  X-rays to show the flow of blood when dye is injected into the veins (venogram).  Studies of your lungs if you have any chest symptoms. PREVENTION  Exercise the legs regularly. Take a brisk 30-minute walk every day.  Maintain a weight that is appropriate for your height.  Avoid sitting or lying in bed for long periods of time without moving your  legs.  Women, particularly those over the age of 37 years, should consider the risks and benefits of taking estrogen medicines, including birth control pills.  Do not smoke, especially if you take estrogen medicines.  Long-distance travel can increase your risk of DVT. You should exercise your legs by walking or pumping the muscles every hour.  Many of the risk factors above relate to situations that exist with hospitalization, either for illness, injury, or elective surgery. Prevention may include medical and nonmedical measures.  Your health care provider will assess you for the need for venous thromboembolism prevention when you are admitted to the hospital. If you are having surgery, your surgeon will assess you the day of or day after surgery. TREATMENT Once identified, a DVT can be treated. It can also be prevented in some circumstances. Once you have had a DVT, you may be at increased risk for a DVT in the future. The most common treatment for DVT is blood-thinning (anticoagulant) medicine, which reduces the blood's tendency to clot. Anticoagulants can stop new blood clots from forming and stop old clots from growing. They cannot dissolve existing clots. Your body does this by itself over time. Anticoagulants can be given by mouth, through an IV tube, or by injection. Your health care provider will determine the best program for you. Other medicines or treatments that may be used are:  Heparin or related medicines (low molecular weight heparin) are often the first treatment for a blood clot. They act quickly. However, they cannot be taken orally and must be given either in shot form or by IV tube.  Heparin can cause a fall in a component of blood that stops bleeding and forms blood clots (platelets). You will be monitored with blood tests to be sure this does not occur.  Warfarin is an anticoagulant that can be swallowed. It takes a few days to start working, so usually heparin or related  medicines are used in combination. Once warfarin is working, heparin is usually stopped.  Factor Xa inhibitor medicines, such as rivaroxaban and apixaban, also reduce blood clotting. These medicines are taken orally and can often be used without heparin or related medicines.  Less commonly, clot dissolving drugs (thrombolytics) are used to dissolve a DVT. They carry a high risk of bleeding, so they are used mainly in severe cases where your life or a part of your body is threatened.  Very rarely, a blood clot in the leg needs to be removed surgically.  If you are unable to take anticoagulants, your health care provider may arrange for you to have a filter placed in a main vein in your abdomen. This filter prevents clots from traveling to your lungs. HOME CARE INSTRUCTIONS  Take all medicines as directed by your health care provider.  Learn as much as you can about DVT.  Wear a medical alert bracelet or carry a medical alert card.  Ask your health care provider how soon you can go back to  normal activities. It is important to stay active to prevent blood clots. If you are on anticoagulant medicine, avoid contact sports.  It is very important to exercise. This is especially important while traveling, sitting, or standing for long periods of time. Exercise your legs by walking or by tightening and relaxing your leg muscles regularly. Take frequent walks.  You may need to wear compression stockings. These are tight elastic stockings that apply pressure to the lower legs. This pressure can help keep the blood in the legs from clotting. Taking Warfarin Warfarin is a daily medicine that is taken by mouth. Your health care provider will advise you on the length of treatment (usually 3-6 months, sometimes lifelong). If you take warfarin:  Understand how to take warfarin and foods that can affect how warfarin works in Veterinary surgeon.  Too much and too little warfarin are both dangerous. Too much warfarin  increases the risk of bleeding. Too little warfarin continues to allow the risk for blood clots. Warfarin and Regular Blood Testing While taking warfarin, you will need to have regular blood tests to measure your blood clotting time. These blood tests usually include both the prothrombin time (PT) and international normalized ratio (INR) tests. The PT and INR results allow your health care provider to adjust your dose of warfarin. It is very important that you have your PT and INR tested as often as directed by your health care provider.  Warfarin and Your Diet Avoid major changes in your diet, or notify your health care provider before changing your diet. Arrange a visit with a registered dietitian to answer your questions. Many foods, especially foods high in vitamin K, can interfere with warfarin and affect the PT and INR results. You should eat a consistent amount of foods high in vitamin K. Foods high in vitamin K include:   Spinach, kale, broccoli, cabbage, collard and turnip greens, Brussels sprouts, peas, cauliflower, seaweed, and parsley.  Beef and pork liver.  Green tea.  Soybean oil. Warfarin with Other Medicines Many medicines can interfere with warfarin and affect the PT and INR results. You must:  Tell your health care provider about any and all medicines, vitamins, and supplements you take, including aspirin and other over-the-counter anti-inflammatory medicines. Be especially cautious with aspirin and anti-inflammatory medicines. Ask your health care provider before taking these.  Do not take or discontinue any prescribed or over-the-counter medicine except on the advice of your health care provider or pharmacist. Warfarin Side Effects Warfarin can have side effects, such as easy bruising and difficulty stopping bleeding. Ask your health care provider or pharmacist about other side effects of warfarin. You will need to:  Hold pressure over cuts for longer than usual.  Notify  your dentist and other health care providers that you are taking warfarin before you undergo any procedures where bleeding may occur. Warfarin with Alcohol and Tobacco   Drinking alcohol frequently can increase the effect of warfarin, leading to excess bleeding. It is best to avoid alcoholic drinks or to consume only very small amounts while taking warfarin. Notify your health care provider if you change your alcohol intake.   Do not use any tobacco products including cigarettes, chewing tobacco, or electronic cigarettes. If you smoke, quit. Ask your health care provider for help with quitting smoking. Alternative Medicines to Warfarin: Factor Xa Inhibitor Medicines  These blood-thinning medicines are taken by mouth, usually for several weeks or longer. It is important to take the medicine every single day at the  same time each day.  There are no regular blood tests required when using these medicines.  There are fewer food and drug interactions than with warfarin.  The side effects of this class of medicine are similar to those of warfarin, including excessive bruising or bleeding. Ask your health care provider or pharmacist about other potential side effects. SEEK MEDICAL CARE IF:  You notice a rapid heartbeat.  You feel weaker or more tired than usual.  You feel faint.  You notice increased bruising.  You feel your symptoms are not getting better in the time expected.  You believe you are having side effects of medicine. SEEK IMMEDIATE MEDICAL CARE IF:  You have chest pain.  You have trouble breathing.  You have new or increased swelling or pain in one leg.  You cough up blood.  You notice blood in vomit, in a bowel movement, or in urine. MAKE SURE YOU:  Understand these instructions.  Will watch your condition.  Will get help right away if you are not doing well or get worse. Document Released: 10/11/2005 Document Revised: 02/25/2014 Document Reviewed:  06/18/2013 [facility] Patient Information 2015 [place], [place]. This information is not intended to replace advice given to you by your health care provider. Make sure you discuss any questions you have with your health care provider.

## 2014-08-09 ENCOUNTER — Encounter (INDEPENDENT_AMBULATORY_CARE_PROVIDER_SITE_OTHER): Payer: Self-pay

## 2014-08-09 ENCOUNTER — Ambulatory Visit (INDEPENDENT_AMBULATORY_CARE_PROVIDER_SITE_OTHER): Payer: Medicare Other | Admitting: Neurology

## 2014-08-09 ENCOUNTER — Encounter: Payer: Self-pay | Admitting: Neurology

## 2014-08-09 VITALS — BP 121/67 | HR 75

## 2014-08-09 DIAGNOSIS — G20 Parkinson's disease: Secondary | ICD-10-CM

## 2014-08-09 DIAGNOSIS — R413 Other amnesia: Secondary | ICD-10-CM

## 2014-08-09 DIAGNOSIS — R269 Unspecified abnormalities of gait and mobility: Secondary | ICD-10-CM

## 2014-08-09 MED ORDER — CARBIDOPA-LEVODOPA 25-100 MG PO TABS: 2.0000 | ORAL_TABLET | Freq: Three times a day (TID) | ORAL | Status: DC

## 2014-08-09 NOTE — Progress Notes (Signed)
[facility]  PATIENT: [name] DOB: 11/30/1922   REASON FOR VISIT: Followup for Parkinson's disease and memory loss   HISTORY OF PRESENT ILLNESS: [name], 80 year old female returns for followup with her daughter. She lives at home, with care giver 24x7. [name]  She has a history of Parkinson's disease, her tremor is under well controlled. She is seated in wheelchair and walks very little. She has a caregiver that stays with her 8 hours a day. She has had no overt falls. Appetite is reportedly good. She is very hard of hearing. She has tolerated Sinemet well without side effects. She has occasional hallucinations but these are not frightening. Her memory score is actually improved since last seen. She returns for reevaluation.  HISTORY: She gradually noticed a right-hand resting tremor around 2010,  and has felt her teeth was chattering over past 2 years as well as problems with walking and difficulty turning over in bed. She has had some falls but has fortunately not been injured. She denies loss of smell. She has vivid dreams but no known sleep disorder. She has some constipation.   She lives independently but her daughter checks on her often. Her daughter prepares foods and brings to her. She has never driven. She admits to some short -term memory loss. She has noted mild dyspnea on exertion in the past year. She has a walker and cane at home but her daughter tells me that she doesn't use them when she goes out. She is assisted by her family when she leaves her home.   She had normal blood studies with the exception of a mildly decreased VItamin D level. She had an MRI which revealed diffuse atrophy, ventriculomegaly and mild subcortical white matter disease.   She was started on Sinemet 25/100 ii tid, and has noted improvement in her condition. She has tolerated this well without side effects.   UPDATE Oct 16th 2015: She is  no longer ambulatory, more confusion, memory trouble,  She continues to take her Sinemet 25/100 mg, 2 tablets 3 times a day, no significant side effects,   She recently developed right lower extremity DVT, is on long-term anticoagulation xarelto   REVIEW OF SYSTEMS: Full 14 system review of systems performed and notable only for those listed, all others are neg:   wheezing, shortness of breath, leg swelling, incontinence of bowel, snoring, walking difficulty, anxiety memory loss, weakness   ALLERGIES: Allergies  Allergen Reactions  . Reglan [Metoclopramide] Other (See Comments)    Tremors really bad   . Metoclopramide Hcl Other (See Comments)    Affected Nervous system- severe jerkiing, shaking, etc..    HOME MEDICATIONS: Outpatient Prescriptions Prior to Visit  Medication Sig Dispense Refill  . acetaminophen (TYLENOL) 325 MG tablet Take 650 mg by mouth 2 (two) times daily. Also given as needed for mild pain in addition to twice daily      . amitriptyline (ELAVIL) 10 MG tablet Take 10 mg by mouth at bedtime.        . carbidopa-levodopa (SINEMET IR) 25-100 MG per tablet Take 2 tablets by mouth 3 (three) times daily. Before meals  180 tablet  6  . ciprofloxacin (CIPRO) 250 MG tablet Take 1 tablet (250 mg total) by mouth 2 (two) times daily.  14 tablet  0  . furosemide (LASIX) 40 MG tablet Take 40 mg by mouth daily.      Marland Kitchen. LORazepam (ATIVAN) 0.5 MG tablet Take  1 tablet (0.5 mg total) by mouth every 6 (six) hours as needed for anxiety.  30 tablet  3  . mirtazapine (REMERON) 15 MG tablet Take 15 mg by mouth at bedtime.        Marland Kitchen. omeprazole (PRILOSEC) 10 MG capsule Take 10 mg by mouth every morning.      Marland Kitchen. PARoxetine (PAXIL) 10 MG tablet Take 10 mg by mouth every morning.      . traMADol (ULTRAM) 50 MG tablet Take 50 mg by mouth daily as needed for pain.      Marland Kitchen. trimethoprim (TRIMPEX) 100 MG tablet Take 100 mg by mouth at bedtime.      Carlena Hurl. XARELTO STARTER PACK 15 & 20 MG TBPK Take 15-20 mg by  mouth as directed. Take as directed on package: Start with one 15mg  tablet by mouth twice a day with food. On Day 22, switch to one 20mg  tablet once a day with food.  51 each  0   No facility-administered medications prior to visit.    PAST MEDICAL HISTORY: Past Medical History  Diagnosis Date  . Hypertension   . GERD (gastroesophageal reflux disease)   . Parkinson disease   . Depression   . Pulmonary embolism   . Arthritis   . Hypercalcemia   . Diabetes mellitus     family denies    PAST SURGICAL HISTORY: Past Surgical History  Procedure Laterality Date  . Esophagogastroduodenoscopy  06/10/2011    Procedure: ESOPHAGOGASTRODUODENOSCOPY (EGD);  Surgeon: Malissa HippoNajeeb U [name], MD;  [facility]: [facility];  Service: Endoscopy;  Laterality: N/A;  . Vena cava filter placement    . Esophagogastroduodenoscopy  07/29/2011    Procedure: ESOPHAGOGASTRODUODENOSCOPY (EGD);  Surgeon: Malissa HippoNajeeb U [name], MD;  Location: [facility];  Service: Endoscopy;  Laterality: N/A;  . Laparoscopic nissen fundoplication  08/09/2011    Procedure: LAPAROSCOPIC NISSEN FUNDOPLICATION;  Surgeon: Fabio BeringBrent C Ziegler;  Location: [facility];  Service: General;  Laterality: N/A;  Laparoscopic Hiatal Hernia Repair  . Gastrostomy  08/09/2011    Procedure: GASTROSTOMY;  Surgeon: Fabio BeringBrent C Ziegler;  Location: [facility];  Service: General;  Laterality: Left;  Laparoscopic Gastrostomy Tube placement  . Hernia repair      FAMILY HISTORY: History reviewed. No pertinent family history.  SOCIAL HISTORY: History   Social History  . Marital Status: Widowed    Spouse Name: N/A    Number of Children: 1  . Years of Education: 5   Occupational History  . Not on file.   Social History Main Topics  . Smoking status: Never Smoker   . Smokeless tobacco: Never Used  . Alcohol Use: No  . Drug Use: No  . Sexual Activity: Not on file   Other Topics Concern  . Not on file   Social History Narrative   Patient is living at home with  caregivers.    Patient is widowed.    Patient has 1 child.    Patient is retired.    Patient has a 5th grade education.            PHYSICAL EXAM  Filed Vitals:   08/09/14 1144  BP: 121/67  Pulse: 75   Cannot calculate BMI with a height equal to zero.  Generalized: Well developed, in no acute distress  Head: normocephalic and atraumatic,. Oropharynx benign  Neck: Supple, no carotid bruits  Cardiac: Regular rate rhythm, no murmur  Neurological examination   Mentation: Alert  MMSE 10/30, she is not oriented to time  and place, missed 3 out of 3 recalls, has difficulty spell world backwards, difficulty with naming  Cranial nerve II-XII:Pupils were equal round reactive to light extraocular movements were full, visual field were full on confrontational test. Facial sensation and strength were normal. Hard of hearing. Uvula tongue midline. head turning and shoulder shrug were normal and symmetric.Tongue protrusion into cheek strength was normal. Motor: She has mild fixed contraction of bilateral knee, maximum 170, bilateral ankle plantar flexion limited range of motion, Coordination: finger-nose-finger,  no dysmetria Reflexes: Brachioradialis 2/2, biceps 2/2, triceps 2/2, patellar 2/2, Achilles 1/1, plantar responses were flexor bilaterally. Gait and Station: In wheelchair not ambulated DIAGNOSTIC DATA (LABS, IMAGING, TESTING) - I reviewed patient records, labs, notes, testing and imaging myself where available.  Lab Results  Component Value Date   WBC 8.7 06/24/2014   HGB 8.8* 06/24/2014   HCT 27.3* 06/24/2014   MCV 99.6 06/24/2014   PLT 129* 06/24/2014      Component Value Date/Time   NA 134* 06/24/2014 1601   K 3.9 06/24/2014 1601   CL 101 06/24/2014 1601   CO2 18* 06/24/2014 1601   GLUCOSE 130* 06/24/2014 1601   BUN 12 06/24/2014 1601   CREATININE 1.01 06/24/2014 1601   CALCIUM 8.1* 06/24/2014 1601   PROT 7.5 06/15/2014 0942   ALBUMIN 3.5 06/15/2014 0942   AST 10 06/15/2014 0942     ALT <5 06/15/2014 0942   ALKPHOS 121* 06/15/2014 0942   BILITOT 0.2* 06/15/2014 0942   GFRNONAA 48* 06/24/2014 1601   GFRAA 55* 06/24/2014 1601    ASSESSMENT AND PLAN  80 y.o. year old female  has a past medical history of Parkinson's disease, also developed memory trouble, Mini-Mental Status Examination is only 10 out of 30 today, she is essentially wheelchair bound, today's I noticed mild dyskinesia, she is taking Sinemet 25/100 mg, 2 tablets 3 times a day  1. She is nonambulatory, side effect from sentiments noticed, will decrease it to sinemet 25/100 mg one tablet 4 times a day, if there is no significant worsening of her symptoms, may continue tapering her lower dose 2. Stop Elavil 10mg  qhs. 3. RTC In 3 months  Levert Feinstein, M.D. Ph.D  [facility] [address] [place], [place] [address] [phone]

## 2014-09-11 ENCOUNTER — Encounter: Payer: Self-pay | Admitting: Neurology

## 2014-09-17 ENCOUNTER — Encounter: Payer: Self-pay | Admitting: Neurology

## 2014-11-12 ENCOUNTER — Encounter: Payer: Self-pay | Admitting: Neurology

## 2014-11-12 ENCOUNTER — Ambulatory Visit (INDEPENDENT_AMBULATORY_CARE_PROVIDER_SITE_OTHER): Payer: PPO | Admitting: Neurology

## 2014-11-12 VITALS — BP 124/72 | HR 80

## 2014-11-12 DIAGNOSIS — G20 Parkinson's disease: Secondary | ICD-10-CM

## 2014-11-12 DIAGNOSIS — R413 Other amnesia: Secondary | ICD-10-CM

## 2014-11-12 DIAGNOSIS — R269 Unspecified abnormalities of gait and mobility: Secondary | ICD-10-CM

## 2014-11-12 MED ORDER — CARBIDOPA-LEVODOPA 25-100 MG PO TABS: 1.0000 | ORAL_TABLET | Freq: Three times a day (TID) | ORAL | Status: DC

## 2014-11-12 NOTE — Progress Notes (Signed)
[facility]  PATIENT: [name] DOB: 1923/03/01   REASON FOR VISIT: Followup for Parkinson's disease and memory loss   HISTORY OF PRESENT ILLNESS: [name], 81 year old female returns for followup with her daughter. She lives at home, with care giver 24x7. Last clinical visit was with Oct 2015  She has a history of Parkinson's disease, her tremor is under well controlled. She is seated in wheelchair and walks very little. She has had no overt falls. Appetite is reportedly good. She is very hard of hearing. She has tolerated Sinemet well without side effects. She has occasional hallucinations but these are not frightening. Her memory score is actually improved since last seen. She returns for reevaluation.  HISTORY: She gradually noticed a right-hand resting tremor around 2010,  She complains of falling over past 2 years as well,   difficulty turning over in bed. She has had some falls but has fortunately not been injured. She denies loss of smell. She has vivid dreams. She has some constipation.   She lives independently but her daughter checks on her often. Her daughter prepares foods and brings to her. She has never driven. She admits to some short -term memory loss. She has noted mild dyspnea on exertion in the past year. She has a walker and cane at home but her daughter tells me that she doesn't use them when she goes out. She is assisted by her family when she leaves her home.   She had normal blood studies with the exception of a mildly decreased VItamin D level. She had an MRI which revealed diffuse atrophy, ventriculomegaly and mild subcortical white matter disease.   She was started on Sinemet 25/100 ii tid, and has noted improvement in her condition. She has tolerated this well without side effects.   UPDATE Oct 16th 2015: She is no longer ambulatory, more confusion, memory trouble,  She continues to take her Sinemet 25/100 mg, 2 tablets 3 times a day, no  significant side effects,  She recently developed right lower extremity DVT, is on long-term anticoagulation xarelto  UPDATE Jan 19th 2016: She is on lower dose of Sinemet, 25/100 mg 4 times a day, with lowering dose, there was no significant change, she is wheelchair bound, sleepy, occasionally evening time agitations  REVIEW OF SYSTEMS: Full 14 system review of systems performed and notable only for those listed, all others are neg:   wheezing, shortness of breath, leg swelling, incontinence of bowel, snoring, walking difficulty, anxiety memory loss, weakness   ALLERGIES: Allergies  Allergen Reactions  . Reglan [Metoclopramide] Other (See Comments)    Tremors really bad   . Metoclopramide Hcl Other (See Comments)    Affected Nervous system- severe jerkiing, shaking, etc..    HOME MEDICATIONS: Outpatient Prescriptions Prior to Visit  Medication Sig Dispense Refill  . acetaminophen (TYLENOL) 325 MG tablet Take 650 mg by mouth 2 (two) times daily. Also given as needed for mild pain in addition to twice daily    . carbidopa-levodopa (SINEMET IR) 25-100 MG per tablet Take 2 tablets by mouth 3 (three) times daily. Before meals (Patient taking differently: Take 2 tablets by mouth 4 (four) times daily. Before meals) 180 tablet 11  . ciprofloxacin (CIPRO) 250 MG tablet Take 1 tablet (250 mg total) by mouth 2 (two) times daily. 14 tablet 0  . furosemide (LASIX) 40 MG tablet Take 40 mg by mouth daily.    Marland Kitchen LORazepam (ATIVAN) 0.5 MG tablet Take 1 tablet (0.5 mg total)  by mouth every 6 (six) hours as needed for anxiety. 30 tablet 3  . mirtazapine (REMERON) 15 MG tablet Take 15 mg by mouth at bedtime.      Marland Kitchen omeprazole (PRILOSEC) 10 MG capsule Take 10 mg by mouth every morning.    Marland Kitchen PARoxetine (PAXIL) 10 MG tablet Take 10 mg by mouth every morning.    . traMADol (ULTRAM) 50 MG tablet Take 50 mg by mouth daily as needed for pain.    Marland Kitchen trimethoprim (TRIMPEX) 100 MG tablet Take 100 mg by mouth at  bedtime.    Carlena Hurl STARTER PACK 15 & 20 MG TBPK Take 15-20 mg by mouth as directed. Take as directed on package: Start with one  tablet by mouth twice a day with food. On Day 22, switch to one  tablet once a day with food. (Patient taking differently: Take 20 mg by mouth as directed. Take as directed on package: Start with one  tablet by mouth twice a day with food. On Day 22, switch to one  tablet once a day with food.) 51 each 0  . amitriptyline (ELAVIL) 10 MG tablet Take 10 mg by mouth at bedtime.      . potassium chloride SA (K-DUR,KLOR-CON) 20 MEQ tablet Take 20 mEq by mouth daily.     No facility-administered medications prior to visit.    PAST MEDICAL HISTORY: Past Medical History  Diagnosis Date  . Hypertension   . GERD (gastroesophageal reflux disease)   . Parkinson disease   . Depression   . Pulmonary embolism   . Arthritis   . Hypercalcemia   . Diabetes mellitus     family denies    PAST SURGICAL HISTORY: Past Surgical History  Procedure Laterality Date  . Esophagogastroduodenoscopy  06/10/2011    Procedure: ESOPHAGOGASTRODUODENOSCOPY (EGD);  Surgeon: Malissa Hippo, MD;  [facility]: [facility];  Service: Endoscopy;  Laterality: N/A;  . Vena cava filter placement    . Esophagogastroduodenoscopy  07/29/2011    Procedure: ESOPHAGOGASTRODUODENOSCOPY (EGD);  Surgeon: Malissa Hippo, MD;  [facility]: [facility];  Service: Endoscopy;  Laterality: N/A;  . Laparoscopic nissen fundoplication  08/09/2011    Procedure: LAPAROSCOPIC NISSEN FUNDOPLICATION;  Surgeon: Fabio Bering;  Location: [facility];  Service: General;  Laterality: N/A;  Laparoscopic Hiatal Hernia Repair  . Gastrostomy  08/09/2011    Procedure: GASTROSTOMY;  Surgeon: Fabio Bering;  Location: [facility];  Service: General;  Laterality: Left;  Laparoscopic Gastrostomy Tube placement  . Hernia repair      FAMILY HISTORY: Family History  Problem Relation Age of Onset  . Diabetes Mother      SOCIAL HISTORY: History   Social History  . Marital Status: Widowed    Spouse Name: N/A    Number of Children: 1  . Years of Education: 5   Occupational History  . Not on file.   Social History Main Topics  . Smoking status: Never Smoker   . Smokeless tobacco: Never Used  . Alcohol Use: No  . Drug Use: No  . Sexual Activity: Not on file   Other Topics Concern  . Not on file   Social History Narrative   Patient is living at home with caregivers.    Patient is widowed.    Patient has 1 child.    Patient is retired.    Patient has a 5th grade education.            PHYSICAL EXAM  Filed Vitals:  11/12/14 1054  BP: 124/72  Pulse: 80   Cannot calculate BMI with a height equal to zero.  Generalized: Well developed, in no acute distress  Head: normocephalic and atraumatic,. Oropharynx benign  Neck: Supple, no carotid bruits  Cardiac: Regular rate rhythm, no murmur  Neurological examination   Mentation: Alert  MMSE 15 /30, she is not oriented to time and place, missed 3 out of 3 recalls, has difficulty spell world backwards, difficulty with naming  Cranial nerve II-XII:Pupils were equal round reactive to light extraocular movements were full, visual field were full on confrontational test. Facial sensation and strength were normal. Hard of hearing. Uvula tongue midline. head turning and shoulder shrug were normal and symmetric.Tongue protrusion into cheek strength was normal. Motor: She has mild fixed contraction of bilateral knee, maximum 170, bilateral ankle plantar flexion limited range of motion, moderate right more than left upper extremity rigidity, no significant upper extremity weakness. Coordination: finger-nose-finger,  no dysmetria Reflexes: Brachioradialis 2/2, biceps 2/2, triceps 2/2, patellar 2/2, Achilles 1/1, plantar responses were flexor bilaterally. Gait and Station: In wheelchair not ambulated DIAGNOSTIC DATA (LABS, IMAGING, TESTING) - I  reviewed patient records, labs, notes, testing and imaging myself where available.  Lab Results  Component Value Date   WBC 8.7 06/24/2014   HGB 8.8* 06/24/2014   HCT 27.3* 06/24/2014   MCV 99.6 06/24/2014   PLT 129* 06/24/2014      Component Value Date/Time   NA 134* 06/24/2014 1601   K 3.9 06/24/2014 1601   CL 101 06/24/2014 1601   CO2 18* 06/24/2014 1601   GLUCOSE 130* 06/24/2014 1601   BUN 12 06/24/2014 1601   CREATININE 1.01 06/24/2014 1601   CALCIUM 8.1* 06/24/2014 1601   PROT 7.5 06/15/2014 0942   ALBUMIN 3.5 06/15/2014 0942   AST 10 06/15/2014 0942   ALT <5 06/15/2014 0942   ALKPHOS 121* 06/15/2014 0942   BILITOT 0.2* 06/15/2014 0942   GFRNONAA 48* 06/24/2014 1601   GFRAA 55* 06/24/2014 1601    ASSESSMENT AND PLAN  81 y.o. year old female  has a past medical history of Parkinson's disease, also developed memory trouble, Mini-Mental Status Examination is only 10 out of 30 today, she is essentially wheelchair bound, today's I noticed mild dyskinesia, she is taking Sinemet 25/100 mg, 4 times a day   1. She is nonambulatory, will continue decrease sinemet 25/100 mg to one tablet 4 times a day 2. Return to clinic in 6 months  No orders of the defined types were placed in this encounter.         Return in about 6 months (around 05/13/2015).  Levert FeinsteinYijun Lariyah Shetterly, M.D. Ph.D  [facility] [address], [address] [place], [place] [address] [phone]

## 2015-04-29 ENCOUNTER — Inpatient Hospital Stay (HOSPITAL_COMMUNITY): Payer: PPO

## 2015-04-29 ENCOUNTER — Encounter (HOSPITAL_COMMUNITY): Payer: Self-pay | Admitting: *Deleted

## 2015-04-29 ENCOUNTER — Inpatient Hospital Stay (HOSPITAL_COMMUNITY)
Admission: EM | Admit: 2015-04-29 | Discharge: 2015-05-01 | DRG: 812 | Disposition: A | Discharge status: Home Health | Payer: PPO | Attending: Internal Medicine | Admitting: Internal Medicine

## 2015-04-29 DIAGNOSIS — K922 Gastrointestinal hemorrhage, unspecified: Secondary | ICD-10-CM

## 2015-04-29 DIAGNOSIS — D509 Iron deficiency anemia, unspecified: Secondary | ICD-10-CM | POA: Diagnosis present

## 2015-04-29 DIAGNOSIS — D649 Anemia, unspecified: Secondary | ICD-10-CM | POA: Diagnosis not present

## 2015-04-29 DIAGNOSIS — G20 Parkinson's disease: Secondary | ICD-10-CM | POA: Diagnosis present

## 2015-04-29 DIAGNOSIS — K219 Gastro-esophageal reflux disease without esophagitis: Secondary | ICD-10-CM | POA: Diagnosis present

## 2015-04-29 DIAGNOSIS — Z7901 Long term (current) use of anticoagulants: Secondary | ICD-10-CM

## 2015-04-29 DIAGNOSIS — Z66 Do not resuscitate: Secondary | ICD-10-CM | POA: Diagnosis present

## 2015-04-29 DIAGNOSIS — M79605 Pain in left leg: Secondary | ICD-10-CM | POA: Diagnosis present

## 2015-04-29 DIAGNOSIS — E46 Unspecified protein-calorie malnutrition: Secondary | ICD-10-CM | POA: Diagnosis present

## 2015-04-29 DIAGNOSIS — R0602 Shortness of breath: Secondary | ICD-10-CM

## 2015-04-29 DIAGNOSIS — Z833 Family history of diabetes mellitus: Secondary | ICD-10-CM | POA: Diagnosis not present

## 2015-04-29 DIAGNOSIS — Z86718 Personal history of other venous thrombosis and embolism: Secondary | ICD-10-CM

## 2015-04-29 DIAGNOSIS — N183 Chronic kidney disease, stage 3 (moderate): Secondary | ICD-10-CM | POA: Diagnosis present

## 2015-04-29 DIAGNOSIS — Z6828 Body mass index (BMI) 28.0-28.9, adult: Secondary | ICD-10-CM | POA: Diagnosis not present

## 2015-04-29 DIAGNOSIS — I129 Hypertensive chronic kidney disease with stage 1 through stage 4 chronic kidney disease, or unspecified chronic kidney disease: Secondary | ICD-10-CM | POA: Diagnosis present

## 2015-04-29 DIAGNOSIS — K921 Melena: Secondary | ICD-10-CM

## 2015-04-29 DIAGNOSIS — I82409 Acute embolism and thrombosis of unspecified deep veins of unspecified lower extremity: Secondary | ICD-10-CM

## 2015-04-29 DIAGNOSIS — M62838 Other muscle spasm: Secondary | ICD-10-CM

## 2015-04-29 DIAGNOSIS — M1990 Unspecified osteoarthritis, unspecified site: Secondary | ICD-10-CM | POA: Diagnosis present

## 2015-04-29 DIAGNOSIS — E1122 Type 2 diabetes mellitus with diabetic chronic kidney disease: Secondary | ICD-10-CM | POA: Diagnosis present

## 2015-04-29 DIAGNOSIS — Z86711 Personal history of pulmonary embolism: Secondary | ICD-10-CM

## 2015-04-29 DIAGNOSIS — R58 Hemorrhage, not elsewhere classified: Secondary | ICD-10-CM

## 2015-04-29 HISTORY — DX: Gastrointestinal hemorrhage, unspecified: K92.2

## 2015-04-29 HISTORY — DX: Anemia, unspecified: D64.9

## 2015-04-29 LAB — CBC WITH DIFFERENTIAL/PLATELET
Basophils Absolute: 0 10*3/uL (ref 0.0–0.1)
Basophils Relative: 1 % (ref 0–1)
Eosinophils Absolute: 0.2 10*3/uL (ref 0.0–0.7)
Eosinophils Relative: 3 % (ref 0–5)
HEMATOCRIT: 15.6 % — AB (ref 36.0–46.0)
Hemoglobin: 4.5 g/dL — CL (ref 12.0–15.0)
LYMPHS ABS: 1.8 10*3/uL (ref 0.7–4.0)
Lymphocytes Relative: 25 % (ref 12–46)
MCH: 20.5 pg — ABNORMAL LOW (ref 26.0–34.0)
MCHC: 28.8 g/dL — AB (ref 30.0–36.0)
MCV: 71.2 fL — AB (ref 78.0–100.0)
MONOS PCT: 14 % — AB (ref 3–12)
Monocytes Absolute: 1.1 10*3/uL — ABNORMAL HIGH (ref 0.1–1.0)
NEUTROS ABS: 4.3 10*3/uL (ref 1.7–7.7)
NEUTROS PCT: 57 % (ref 43–77)
PLATELETS: 214 10*3/uL (ref 150–400)
RBC: 2.19 MIL/uL — ABNORMAL LOW (ref 3.87–5.11)
RDW: 20.2 % — ABNORMAL HIGH (ref 11.5–15.5)
WBC: 7.4 10*3/uL (ref 4.0–10.5)

## 2015-04-29 LAB — BASIC METABOLIC PANEL
ANION GAP: 9 (ref 5–15)
BUN: 21 mg/dL — ABNORMAL HIGH (ref 6–20)
CALCIUM: 8.5 mg/dL — AB (ref 8.9–10.3)
CHLORIDE: 100 mmol/L — AB (ref 101–111)
CO2: 21 mmol/L — AB (ref 22–32)
Creatinine, Ser: 1.45 mg/dL — ABNORMAL HIGH (ref 0.44–1.00)
GFR calc Af Amer: 36 mL/min — ABNORMAL LOW (ref >60)
GFR calc non Af Amer: 31 mL/min — ABNORMAL LOW (ref >60)
GLUCOSE: 115 mg/dL — AB (ref 65–99)
Potassium: 5.4 mmol/L — ABNORMAL HIGH (ref 3.5–5.1)
Sodium: 130 mmol/L — ABNORMAL LOW (ref 135–145)

## 2015-04-29 LAB — PROTIME-INR
INR: 2.74 — ABNORMAL HIGH (ref 0.00–1.49)
Prothrombin Time: 28.6 seconds — ABNORMAL HIGH (ref 11.6–15.2)

## 2015-04-29 LAB — APTT: aPTT: 38 seconds — ABNORMAL HIGH (ref 24–37)

## 2015-04-29 LAB — D-DIMER, QUANTITATIVE (NOT AT ARMC): D DIMER QUANT: 0.49 ug{FEU}/mL — AB (ref 0.00–0.48)

## 2015-04-29 LAB — PREPARE RBC (CROSSMATCH)

## 2015-04-29 LAB — POC OCCULT BLOOD, ED: Fecal Occult Bld: POSITIVE — AB

## 2015-04-29 MED ORDER — ONDANSETRON HCL 4 MG PO TABS: 4.0000 mg | ORAL_TABLET | Freq: Four times a day (QID) | ORAL | Status: DC | PRN

## 2015-04-29 MED ORDER — PANTOPRAZOLE SODIUM 40 MG IV SOLR
40.0000 mg | Freq: Two times a day (BID) | INTRAVENOUS | Status: DC
  Administered 2015-04-29 (×2): 40 mg via INTRAVENOUS
  Filled 2015-04-29 (×2): qty 40

## 2015-04-29 MED ORDER — SODIUM CHLORIDE 0.9 % IV SOLN: 250.0000 mL | INTRAVENOUS | Status: DC | PRN

## 2015-04-29 MED ORDER — SODIUM POLYSTYRENE SULFONATE 15 GM/60ML PO SUSP
15.0000 g | Freq: Once | ORAL | Status: AC
  Administered 2015-04-29: 15 g via ORAL
  Filled 2015-04-29: qty 60

## 2015-04-29 MED ORDER — MIRTAZAPINE 15 MG PO TABS
15.0000 mg | ORAL_TABLET | Freq: Every day | ORAL | Status: DC
  Administered 2015-04-29 – 2015-04-30 (×2): 15 mg via ORAL
  Filled 2015-04-29 (×2): qty 1

## 2015-04-29 MED ORDER — SODIUM CHLORIDE 0.9 % IJ SOLN
3.0000 mL | Freq: Two times a day (BID) | INTRAMUSCULAR | Status: DC
  Administered 2015-04-29 – 2015-05-01 (×4): 3 mL via INTRAVENOUS

## 2015-04-29 MED ORDER — PAROXETINE HCL 20 MG PO TABS
10.0000 mg | ORAL_TABLET | Freq: Every morning | ORAL | Status: DC
  Administered 2015-04-29 – 2015-05-01 (×3): 10 mg via ORAL
  Filled 2015-04-29 (×5): qty 1

## 2015-04-29 MED ORDER — SODIUM CHLORIDE 0.9 % IV SOLN
Freq: Once | INTRAVENOUS | Status: AC
  Administered 2015-04-29: 04:00:00 via INTRAVENOUS

## 2015-04-29 MED ORDER — TRIMETHOPRIM 100 MG PO TABS
100.0000 mg | ORAL_TABLET | Freq: Every day | ORAL | Status: DC
  Administered 2015-04-29 – 2015-04-30 (×2): 100 mg via ORAL
  Filled 2015-04-29 (×3): qty 1

## 2015-04-29 MED ORDER — DIAZEPAM 5 MG/ML IJ SOLN
2.5000 mg | Freq: Once | INTRAMUSCULAR | Status: AC
  Administered 2015-04-29: 2.5 mg via INTRAVENOUS
  Filled 2015-04-29: qty 2

## 2015-04-29 MED ORDER — ONDANSETRON HCL 4 MG/2ML IJ SOLN
4.0000 mg | Freq: Four times a day (QID) | INTRAMUSCULAR | Status: DC | PRN
  Administered 2015-04-29: 4 mg via INTRAVENOUS
  Filled 2015-04-29: qty 2

## 2015-04-29 MED ORDER — SODIUM CHLORIDE 0.9 % IJ SOLN
3.0000 mL | INTRAMUSCULAR | Status: DC | PRN
  Administered 2015-04-30: 3 mL via INTRAVENOUS
  Filled 2015-04-29: qty 3

## 2015-04-29 MED ORDER — CARBIDOPA-LEVODOPA 25-100 MG PO TABS
1.0000 | ORAL_TABLET | Freq: Three times a day (TID) | ORAL | Status: DC
  Administered 2015-04-29 – 2015-05-01 (×8): 1 via ORAL
  Filled 2015-04-29 (×8): qty 1

## 2015-04-29 MED ORDER — FUROSEMIDE 10 MG/ML IJ SOLN
20.0000 mg | Freq: Once | INTRAMUSCULAR | Status: AC
  Administered 2015-04-29: 20 mg via INTRAVENOUS
  Filled 2015-04-29: qty 2

## 2015-04-29 MED ORDER — MORPHINE SULFATE 2 MG/ML IJ SOLN
1.0000 mg | INTRAMUSCULAR | Status: DC | PRN
  Administered 2015-04-29 – 2015-05-01 (×9): 1 mg via INTRAVENOUS
  Filled 2015-04-29 (×9): qty 1

## 2015-04-29 NOTE — ED Notes (Signed)
Report given to Sheena RN on 300 

## 2015-04-29 NOTE — Progress Notes (Signed)
Subjective: [name] was admitted last night with a microcytic anemia and positive Hemoccults. She is receiving her first unit of packed red cells now. She has been hemodynamically stable and there has been no other sign of GI bleeding. She had a CT scan of the abdomen which revealed no source. She has been anticoagulated with Xarelto for a DVT. She has an IVC filter in place.  Objective: Vital signs in last 24 hours: Filed Vitals:   04/29/15 0530 04/29/15 0545 04/29/15 0632 04/29/15 0645  BP: 120/62  106/47 108/52  Pulse:  97 92 88  Temp:   97.7 F (36.5 C) 97.1 F (36.2 C)  TempSrc:   Oral Oral  Resp: 18 26 20 18   Height:   4\' 8"  (1.422 m)   Weight:   126 lb 8.7 oz (57.4 kg)   SpO2:  100% 93% 95%   Weight change:   Intake/Output Summary (Last 24 hours) at 04/29/15 0724 Last data filed at 04/29/15 1610  Gross per 24 hour  Intake    335 ml  Output      0 ml  Net    335 ml    Physical Exam: No distress. Lungs clear. Heart regular with no murmurs. Abdomen is soft and nontender. Extremities reveal no edema.  Lab Results:    Results for orders placed or performed during the hospital encounter of 04/29/15 (from the past 24 hour(s))  CBC with Differential     Status: Abnormal   Collection Time: 04/29/15  3:20 AM  Result Value Ref Range   WBC 7.4 4.0 - 10.5 K/uL   RBC 2.19 (L) 3.87 - 5.11 MIL/uL   Hemoglobin 4.5 (LL) 12.0 - 15.0 g/dL   HCT 96.0 (L) 45.4 - 09.8 %   MCV 71.2 (L) 78.0 - 100.0 fL   MCH 20.5 (L) 26.0 - 34.0 pg   MCHC 28.8 (L) 30.0 - 36.0 g/dL   RDW 11.9 (H) 14.7 - 82.9 %   Platelets 214 150 - 400 K/uL   Neutrophils Relative % 57 43 - 77 %   Neutro Abs 4.3 1.7 - 7.7 K/uL   Lymphocytes Relative 25 12 - 46 %   Lymphs Abs 1.8 0.7 - 4.0 K/uL   Monocytes Relative 14 (H) 3 - 12 %   Monocytes Absolute 1.1 (H) 0.1 - 1.0 K/uL   Eosinophils Relative 3 0 - 5 %   Eosinophils Absolute 0.2 0.0 - 0.7 K/uL   Basophils Relative 1 0 - 1 %   Basophils Absolute 0.0 0.0 - 0.1  K/uL   RBC Morphology STOMATOCYTES   Basic metabolic panel     Status: Abnormal   Collection Time: 04/29/15  3:20 AM  Result Value Ref Range   Sodium 130 (L) 135 - 145 mmol/L   Potassium 5.4 (H) 3.5 - 5.1 mmol/L   Chloride 100 (L) 101 - 111 mmol/L   CO2 21 (L) 22 - 32 mmol/L   Glucose, Bld 115 (H) 65 - 99 mg/dL   BUN 21 (H) 6 - 20 mg/dL   Creatinine, Ser 5.62 (H) 0.44 - 1.00 mg/dL   Calcium 8.5 (L) 8.9 - 10.3 mg/dL   GFR calc non Af Amer 31 (L) >60 mL/min   GFR calc Af Amer 36 (L) >60 mL/min   Anion gap 9 5 - 15  APTT     Status: Abnormal   Collection Time: 04/29/15  3:20 AM  Result Value Ref Range   aPTT 38 (H) 24 -  37 seconds  D-dimer, quantitative (not at [facility])     Status: Abnormal   Collection Time: 04/29/15  3:20 AM  Result Value Ref Range   D-Dimer, Quant 0.49 (H) 0.00 - 0.48 ug/mL-FEU  Protime-INR     Status: Abnormal   Collection Time: 04/29/15  3:20 AM  Result Value Ref Range   Prothrombin Time 28.6 (H) 11.6 - 15.2 seconds   INR 2.74 (H) 0.00 - 1.49  POC occult blood, ED RN will collect     Status: Abnormal   Collection Time: 04/29/15  4:04 AM  Result Value Ref Range   Fecal Occult Bld POSITIVE (A) NEGATIVE  Type and screen     Status: None (Preliminary result)   Collection Time: 04/29/15  4:30 AM  Result Value Ref Range   ABO/RH(D) B POS    Antibody Screen NEG    Sample Expiration 05/02/2015    Unit Number [id number]    Blood Component Type RED CELLS,LR    Unit division 00    Status of Unit ISSUED    Transfusion Status OK TO TRANSFUSE    Crossmatch Result Compatible    Unit Number [id number]    Blood Component Type RED CELLS,LR    Unit division 00    Status of Unit ALLOCATED    Transfusion Status OK TO TRANSFUSE    Crossmatch Result Compatible   Prepare RBC     Status: None   Collection Time: 04/29/15  4:30 AM  Result Value Ref Range   Order Confirmation ORDER PROCESSED BY BLOOD BANK      ABGS No results for input(s): PHART, PO2ART, TCO2,  HCO3 in the last 72 hours.  Invalid input(s): PCO2 CULTURES No results found for this or any previous visit (from the past 240 hour(s)). Studies/Results: Ct Abdomen Pelvis Wo Contrast  04/29/2015   ADDENDUM REPORT: 04/29/2015 06:44  ADDENDUM: Requested clarification on the topic of anemia: There is no hemorrhage in the abdomen or pelvis.   Electronically Signed   By: Marnee SpringJonathon  Watts M.D.   On: 04/29/2015 06:44   04/29/2015   CLINICAL DATA:  Anemia.  Retroperitoneal bleed.  EXAM: CT ABDOMEN AND PELVIS WITHOUT CONTRAST  TECHNIQUE: Multidetector CT imaging of the abdomen and pelvis was performed following the standard protocol without IV contrast.  COMPARISON:  06/08/2011  FINDINGS: BODY WALL: Moderate anasarca.  LOWER CHEST: Extensive coronary atherosclerosis.  Small bilateral pleural effusion with lower lobe atelectasis. There is a moderate sliding hiatal hernia.  ABDOMEN/PELVIS:  Liver: 16 mm cyst in the right liver.  No acute findings.  Biliary: No evidence of biliary obstruction or stone.  Pancreas: Unremarkable.  Spleen: Unremarkable.  Adrenals: Unremarkable.  Kidneys and ureters: Bilateral renal cortical thinning with cysts measuring up to 44 mm on the right. No hydronephrosis.  Bladder: Lobulated/trabeculated bladder with moderate distension. Appearance is stable from previous and there is no superimposed inflammatory change.  Reproductive: Hysterectomy.  Negative adnexa.  Bowel: No obstruction. No inflammatory bowel wall thickening. Mild distal colonic diverticulosis.  Retroperitoneum: No hematoma.  No adenopathy.  Peritoneum: No ascites or pneumoperitoneum.  Vascular: IVC filter noted.  No acute findings.  OSSEOUS: Advanced lumbar degenerative disc and facet disease with dextroscoliosis.  IMPRESSION: 1. No explanation for anemia, including retroperitoneal hemorrhage. 2. Small bilateral pleural effusion with atelectasis. 3. Multiple incidental findings are stable from 2012 and noted above.   Electronically Signed: By: Marnee SpringJonathon  Watts M.D. On: 04/29/2015 05:36   Micro Results: No results found for this or any  previous visit (from the past 240 hour(s)). Studies/Results: Ct Abdomen Pelvis Wo Contrast  04/29/2015   ADDENDUM REPORT: 04/29/2015 06:44  ADDENDUM: Requested clarification on the topic of anemia: There is no hemorrhage in the abdomen or pelvis.   Electronically Signed   By: Marnee Spring M.D.   On: 04/29/2015 06:44   04/29/2015   CLINICAL DATA:  Anemia.  Retroperitoneal bleed.  EXAM: CT ABDOMEN AND PELVIS WITHOUT CONTRAST  TECHNIQUE: Multidetector CT imaging of the abdomen and pelvis was performed following the standard protocol without IV contrast.  COMPARISON:  06/08/2011  FINDINGS: BODY WALL: Moderate anasarca.  LOWER CHEST: Extensive coronary atherosclerosis.  Small bilateral pleural effusion with lower lobe atelectasis. There is a moderate sliding hiatal hernia.  ABDOMEN/PELVIS:  Liver: 16 mm cyst in the right liver.  No acute findings.  Biliary: No evidence of biliary obstruction or stone.  Pancreas: Unremarkable.  Spleen: Unremarkable.  Adrenals: Unremarkable.  Kidneys and ureters: Bilateral renal cortical thinning with cysts measuring up to 44 mm on the right. No hydronephrosis.  Bladder: Lobulated/trabeculated bladder with moderate distension. Appearance is stable from previous and there is no superimposed inflammatory change.  Reproductive: Hysterectomy.  Negative adnexa.  Bowel: No obstruction. No inflammatory bowel wall thickening. Mild distal colonic diverticulosis.  Retroperitoneum: No hematoma.  No adenopathy.  Peritoneum: No ascites or pneumoperitoneum.  Vascular: IVC filter noted.  No acute findings.  OSSEOUS: Advanced lumbar degenerative disc and facet disease with dextroscoliosis.  IMPRESSION: 1. No explanation for anemia, including retroperitoneal hemorrhage. 2. Small bilateral pleural effusion with atelectasis. 3. Multiple incidental findings are stable from 2012 and  noted above.  Electronically Signed: By: Marnee Spring M.D. On: 04/29/2015 05:36   Medications:  I have reviewed the patient's current medications Scheduled Meds: . carbidopa-levodopa  1 tablet Oral TID  . mirtazapine  15 mg Oral QHS  . pantoprazole (PROTONIX) IV  40 mg Intravenous Q12H  . PARoxetine  10 mg Oral q morning - 10a  . sodium chloride  3 mL Intravenous Q12H  . sodium polystyrene  15 g Oral Once  . trimethoprim  100 mg Oral QHS   Continuous Infusions:  PRN Meds:.sodium chloride, morphine injection, ondansetron **OR** ondansetron (ZOFRAN) IV, sodium chloride   Assessment/Plan: #1. Microcytic anemia/GI bleed. Transfuse 2 units of packed red cells. Repeat CBC after that. Continue Protonix. Family would not be interested in pursuing diagnostic evaluation with endoscopic evaluation. Hold Xarelto. #2. Parkinson's disease. #3. Recurrent UTIs. She is on suppression with trimethoprim. Active Problems:   Parkinson disease   GI bleeding   Anemia     LOS: 0 days   Lakia Gritton 04/29/2015, 7:24 AM

## 2015-04-29 NOTE — ED Notes (Signed)
Pt c/o left leg pain that has gotten progressively worse; pt has hx of DVT in left leg

## 2015-04-29 NOTE — ED Provider Notes (Signed)
CSN: [id number]     Arrival date & time 04/29/15  0308 History   First MD Initiated Contact with Patient 04/29/15 [phone]     Chief Complaint  Patient presents with  . Leg Pain     (Consider location/radiation/quality/duration/timing/severity/associated sxs/prior Treatment) HPI  history obtained from daughter because patient is extremely hard of hearing. Her daughter reports the patient has been complaining of pain in her left leg and indicates the left posterior thigh for the past 3 days. They were giving her Tylenol and using icy hot which helped until yesterday. She states the leg seems to "draw up" and the patient is extremely painful. She denies any swelling or trauma. She denies any rectal bleeding, hematuria, or melanoma. She does states the patient does seem paler than usual. She is unsure if the patient has ever had a blood transfusion in the past. She states the patient has a history of pulmonary embolus and also had DVT last summer. She has a filter and she is also taking xarelto. Patient denies chest pain. Patient does complain of abdominal pain however I am unable to assess where it hurts.   PCP Dr [name]  Past Medical History  Diagnosis Date  . Hypertension   . GERD (gastroesophageal reflux disease)   . Parkinson disease   . Depression   . Pulmonary embolism   . Arthritis   . Hypercalcemia   . Diabetes mellitus     family denies   Past Surgical History  Procedure Laterality Date  . Esophagogastroduodenoscopy  06/10/2011    Procedure: ESOPHAGOGASTRODUODENOSCOPY (EGD);  Surgeon: Malissa Hippo, MD;  Location: [facility];  Service: Endoscopy;  Laterality: N/A;  . Vena cava filter placement    . Esophagogastroduodenoscopy  07/29/2011    Procedure: ESOPHAGOGASTRODUODENOSCOPY (EGD);  Surgeon: Malissa Hippo, MD;  Location: [facility];  Service: Endoscopy;  Laterality: N/A;  . Laparoscopic nissen fundoplication  08/09/2011    Procedure: LAPAROSCOPIC NISSEN FUNDOPLICATION;   Surgeon: Fabio Bering;  Location: [facility];  Service: General;  Laterality: N/A;  Laparoscopic Hiatal Hernia Repair  . Gastrostomy  08/09/2011    Procedure: GASTROSTOMY;  Surgeon: Fabio Bering;  Location: [facility];  Service: General;  Laterality: Left;  Laparoscopic Gastrostomy Tube placement  . Hernia repair     Family History  Problem Relation Age of Onset  . Diabetes Mother    History  Substance Use Topics  . Smoking status: Never Smoker   . Smokeless tobacco: Never Used  . Alcohol Use: No   Lives at home Has in-home care 24/7  OB History    No data available     Review of Systems  All other systems reviewed and are negative.     Allergies  Reglan; Ambien; and Metoclopramide hcl  Home Medications   Prior to Admission medications   Medication Sig Start Date End Date Taking? Authorizing Provider  acetaminophen (TYLENOL) 325 MG tablet Take 650 mg by mouth 2 (two) times daily. Also given as needed for mild pain in addition to twice daily    Historical Provider, MD  carbidopa-levodopa (SINEMET) 25-100 MG per tablet Take 1 tablet by mouth 3 (three) times daily. 11/12/14   Levert Feinstein, MD  LORazepam (ATIVAN) 0.5 MG tablet Take 1 tablet (0.5 mg total) by mouth every 6 (six) hours as needed for anxiety. 05/09/13   Carylon Perches, MD  mirtazapine (REMERON) 15 MG tablet Take 15 mg by mouth at bedtime.      Historical  Provider, MD  omeprazole (PRILOSEC) 10 MG capsule Take 10 mg by mouth every morning.    Historical Provider, MD  PARoxetine (PAXIL) 10 MG tablet Take 10 mg by mouth every morning.    Historical Provider, MD  traMADol (ULTRAM) 50 MG tablet Take 50 mg by mouth daily as needed for pain.    Historical Provider, MD  trimethoprim (TRIMPEX) 100 MG tablet Take 100 mg by mouth at bedtime. 01/08/13   Historical Provider, MD  XARELTO STARTER PACK 15 & 20 MG TBPK Take 15-20 mg by mouth as directed. Take as directed on package: Start with one 15mg  tablet by mouth twice a day with food. On  Day 22, switch to one 20mg  tablet once a day with food. Patient taking differently: Take 20 mg by mouth as directed. Take as directed on package: Start with one 15mg  tablet by mouth twice a day with food. On Day 22, switch to one 20mg  tablet once a day with food. 06/24/14   Kristen N Ward, DO   BP 106/47 mmHg  Pulse 92  Temp(Src) 97.7 F (36.5 C) (Oral)  Resp 20  Ht 4\' 8"  (1.422 m)  Wt 126 lb 8.7 oz (57.4 kg)  BMI 28.39 kg/m2  SpO2 93%  Vital signs normal   Physical Exam  Constitutional: She is oriented to person, place, and time. She appears well-developed and well-nourished.  Non-toxic appearance. She does not appear ill. No distress.  HENT:  Head: Normocephalic and atraumatic.  Right Ear: External ear normal.  Left Ear: External ear normal.  Nose: Nose normal. No mucosal edema or rhinorrhea.  Mouth/Throat: Oropharynx is clear and moist and mucous membranes are normal. No dental abscesses or uvula swelling.  Patient extremely hard of hearing  Eyes: Conjunctivae and EOM are normal. Pupils are equal, round, and reactive to light.  Neck: Normal range of motion and full passive range of motion without pain. Neck supple.  Cardiovascular: Normal rate, regular rhythm and normal heart sounds.  Exam reveals no gallop and no friction rub.   No murmur heard. Pulmonary/Chest: Effort normal and breath sounds normal. No respiratory distress. She has no wheezes. She has no rhonchi. She has no rales. She exhibits no tenderness and no crepitus.  Abdominal: Soft. Normal appearance and bowel sounds are normal. She exhibits no distension. There is no tenderness. There is no rebound and no guarding.  Musculoskeletal: Normal range of motion. She exhibits no edema or tenderness.  Patient is holding her left leg flexed. When I palpate her hamstring muscles they are extremely tight. They are also however tight on the right side.  Neurological: She is alert and oriented to person, place, and time. She has  normal strength. No cranial nerve deficit.  Skin: Skin is warm, dry and intact. No rash noted. No erythema. There is pallor.  Psychiatric: She has a normal mood and affect. Her speech is normal and behavior is normal. Her mood appears not anxious.  Nursing note and vitals reviewed.   ED Course  Procedures (including critical care time)  Medications  diazepam (VALIUM) injection 2.5 mg (2.5 mg Intravenous Given 04/29/15 0427)  0.9 %  sodium chloride infusion ( Intravenous New Bag/Given 04/29/15 0427)   Patient was given IV Valium for her leg cramps.   I discussed with daughter her new anemia. They're agreeable for admission. Patient was typed and crossed for 2 units of packed red blood cells. Her Hemoccult was positive, the stool was not black however it did have a  red tinge to it.  4:16 AM Dr. Sharl Ma, admit to tele, Attending Dr [name].   Labs Review Results for orders placed or performed during the hospital encounter of 04/29/15  CBC with Differential  Result Value Ref Range   WBC 7.4 4.0 - 10.5 K/uL   RBC 2.19 (L) 3.87 - 5.11 MIL/uL   Hemoglobin 4.5 (LL) 12.0 - 15.0 g/dL   HCT 40.9 (L) 81.1 - 91.4 %   MCV 71.2 (L) 78.0 - 100.0 fL   MCH 20.5 (L) 26.0 - 34.0 pg   MCHC 28.8 (L) 30.0 - 36.0 g/dL   RDW 78.2 (H) 95.6 - 21.3 %   Platelets 214 150 - 400 K/uL   Neutrophils Relative % 57 43 - 77 %   Neutro Abs 4.3 1.7 - 7.7 K/uL   Lymphocytes Relative 25 12 - 46 %   Lymphs Abs 1.8 0.7 - 4.0 K/uL   Monocytes Relative 14 (H) 3 - 12 %   Monocytes Absolute 1.1 (H) 0.1 - 1.0 K/uL   Eosinophils Relative 3 0 - 5 %   Eosinophils Absolute 0.2 0.0 - 0.7 K/uL   Basophils Relative 1 0 - 1 %   Basophils Absolute 0.0 0.0 - 0.1 K/uL   RBC Morphology STOMATOCYTES   Basic metabolic panel  Result Value Ref Range   Sodium 130 (L) 135 - 145 mmol/L   Potassium 5.4 (H) 3.5 - 5.1 mmol/L   Chloride 100 (L) 101 - 111 mmol/L   CO2 21 (L) 22 - 32 mmol/L   Glucose, Bld 115 (H) 65 - 99 mg/dL   BUN 21 (H) 6 -  20 mg/dL   Creatinine, Ser 0.86 (H) 0.44 - 1.00 mg/dL   Calcium 8.5 (L) 8.9 - 10.3 mg/dL   GFR calc non Af Amer 31 (L) >60 mL/min   GFR calc Af Amer 36 (L) >60 mL/min   Anion gap 9 5 - 15  APTT  Result Value Ref Range   aPTT 38 (H) 24 - 37 seconds  D-dimer, quantitative (not at [facility])  Result Value Ref Range   D-Dimer, Quant 0.49 (H) 0.00 - 0.48 ug/mL-FEU  Protime-INR  Result Value Ref Range   Prothrombin Time 28.6 (H) 11.6 - 15.2 seconds   INR 2.74 (H) 0.00 - 1.49  POC occult blood, ED RN will collect  Result Value Ref Range   Fecal Occult Bld POSITIVE (A) NEGATIVE  Type and screen  Result Value Ref Range   ABO/RH(D) B POS    Antibody Screen NEG    Sample Expiration 05/02/2015    Unit Number [id number]    Blood Component Type RED CELLS,LR    Unit division 00    Status of Unit ISSUED    Transfusion Status OK TO TRANSFUSE    Crossmatch Result Compatible    Unit Number [id number]    Blood Component Type RED CELLS,LR    Unit division 00    Status of Unit ALLOCATED    Transfusion Status OK TO TRANSFUSE    Crossmatch Result Compatible   Prepare RBC  Result Value Ref Range   Order Confirmation ORDER PROCESSED BY BLOOD BANK    Laboratory interpretation all normal except worsening of her chronic anemia, new renal insufficiency with elevation of BUN consistent with GI bleeding, positive Hemoccult, mild hyperkalemia, abnormal INR and PTT while on xarelto     Imaging Review Ct Abdomen Pelvis Wo Contrast  04/29/2015   ADDENDUM REPORT: 04/29/2015 06:44  ADDENDUM: Requested clarification on the  topic of anemia: There is no hemorrhage in the abdomen or pelvis.   Electronically Signed   By: Marnee Spring M.D.   On: 04/29/2015 06:44   04/29/2015   CLINICAL DATA:  Anemia.  Retroperitoneal bleed.  EXAM: CT ABDOMEN AND PELVIS WITHOUT CONTRAST  TECHNIQUE: Multidetector CT imaging of the abdomen and pelvis was performed following the standard protocol without IV contrast.  COMPARISON:   06/08/2011  FINDINGS: BODY WALL: Moderate anasarca.  LOWER CHEST: Extensive coronary atherosclerosis.  Small bilateral pleural effusion with lower lobe atelectasis. There is a moderate sliding hiatal hernia.  ABDOMEN/PELVIS:  Liver: 16 mm cyst in the right liver.  No acute findings.  Biliary: No evidence of biliary obstruction or stone.  Pancreas: Unremarkable.  Spleen: Unremarkable.  Adrenals: Unremarkable.  Kidneys and ureters: Bilateral renal cortical thinning with cysts measuring up to 44 mm on the right. No hydronephrosis.  Bladder: Lobulated/trabeculated bladder with moderate distension. Appearance is stable from previous and there is no superimposed inflammatory change.  Reproductive: Hysterectomy.  Negative adnexa.  Bowel: No obstruction. No inflammatory bowel wall thickening. Mild distal colonic diverticulosis.  Retroperitoneum: No hematoma.  No adenopathy.  Peritoneum: No ascites or pneumoperitoneum.  Vascular: IVC filter noted.  No acute findings.  OSSEOUS: Advanced lumbar degenerative disc and facet disease with dextroscoliosis.  IMPRESSION: 1. No explanation for anemia, including retroperitoneal hemorrhage. 2. Small bilateral pleural effusion with atelectasis. 3. Multiple incidental findings are stable from 2012 and noted above.  Electronically Signed: By: Marnee Spring M.D. On: 04/29/2015 05:36     EKG Interpretation None      MDM   Final diagnoses:  Muscle spasm of left lower extremity  Anemia, unspecified anemia type  Lower GI bleeding   Plan admission  Devoria Albe, MD, FACEP  CRITICAL CARE Performed by: Devoria Albe L Total critical care time: 31 min Critical care time was exclusive of separately billable procedures and treating other patients. Critical care was necessary to treat or prevent imminent or life-threatening deterioration. Critical care was time spent personally by me on the following activities: development of treatment plan with patient and/or surrogate as well as  nursing, discussions with consultants, evaluation of patient's response to treatment, examination of patient, obtaining history from patient or surrogate, ordering and performing treatments and interventions, ordering and review of laboratory studies, ordering and review of radiographic studies, pulse oximetry and re-evaluation of patient's condition.    Devoria Albe, MD 04/29/15 [phone]

## 2015-04-29 NOTE — ED Notes (Signed)
CRITICAL VALUE ALERT  Critical value received:  Hgb 4.5 Date of notification:  04/29/15  Time of notification:  0351  Critical value read back:Yes.    Nurse who received alert:  Neldon Mcina Colleene Swarthout RN  MD notified (1st page):  Dr. Lynelle DoctorKnapp at [phone]

## 2015-04-29 NOTE — H&P (Signed)
PCP:   Carylon PerchesFAGAN,ROY, MD   Chief Complaint:  Left leg pain  HPI: [id number] old female who    has a past medical history of Hypertension; GERD (gastroesophageal reflux disease); Parkinson disease; Depression; Pulmonary embolism; Arthritis; Hypercalcemia; and Diabetes mellitus. Patient has been on anticoagulation with Xarelto for left leg DVT. She has been complaining of pain in the left thigh and lower abdominal area for past few days. Today patient was brought to the hospital and was found to be profoundly anemic with hemoglobin 4.5. Stool for occult blood was positive in the ED. Patient complaining of pain in the left thigh and lower abdominal area. She denies nausea vomiting diarrhea. No chest pain or shortness of breath. No fever dysuria urgency frequency of urination. No recent history of vomiting blood no bloody diarrhea. INR 2.45  Allergies:   Allergies  Allergen Reactions  . Reglan [Metoclopramide] Other (See Comments)    Tremors really bad   . Ambien [Zolpidem]   . Metoclopramide Hcl Other (See Comments)    Affected Nervous system- severe jerkiing, shaking, etc..      Past Medical History  Diagnosis Date  . Hypertension   . GERD (gastroesophageal reflux disease)   . Parkinson disease   . Depression   . Pulmonary embolism   . Arthritis   . Hypercalcemia   . Diabetes mellitus     family denies    Past Surgical History  Procedure Laterality Date  . Esophagogastroduodenoscopy  06/10/2011    Procedure: ESOPHAGOGASTRODUODENOSCOPY (EGD);  Surgeon: Malissa HippoNajeeb U [name], MD;  Location: [facility];  Service: Endoscopy;  Laterality: N/A;  . Vena cava filter placement    . Esophagogastroduodenoscopy  07/29/2011    Procedure: ESOPHAGOGASTRODUODENOSCOPY (EGD);  Surgeon: Malissa HippoNajeeb U [name], MD;  Location: [facility];  Service: Endoscopy;  Laterality: N/A;  . Laparoscopic nissen fundoplication  08/09/2011    Procedure: LAPAROSCOPIC NISSEN FUNDOPLICATION;  Surgeon: Fabio BeringBrent C Ziegler;   Location: [facility];  Service: General;  Laterality: N/A;  Laparoscopic Hiatal Hernia Repair  . Gastrostomy  08/09/2011    Procedure: GASTROSTOMY;  Surgeon: Fabio BeringBrent C Ziegler;  Location: [facility];  Service: General;  Laterality: Left;  Laparoscopic Gastrostomy Tube placement  . Hernia repair      Prior to Admission medications   Medication Sig Start Date End Date Taking? Authorizing Provider  acetaminophen (TYLENOL) 325 MG tablet Take 650 mg by mouth 2 (two) times daily. Also given as needed for mild pain in addition to twice daily    Historical Provider, MD  carbidopa-levodopa (SINEMET) 25-100 MG per tablet Take 1 tablet by mouth 3 (three) times daily. 11/12/14   Levert FeinsteinYijun Yan, MD  LORazepam (ATIVAN) 0.5 MG tablet Take 1 tablet (0.5 mg total) by mouth every 6 (six) hours as needed for anxiety. 05/09/13   Carylon Perchesoy Fagan, MD  mirtazapine (REMERON) 15 MG tablet Take 15 mg by mouth at bedtime.      Historical Provider, MD  omeprazole (PRILOSEC) 10 MG capsule Take 10 mg by mouth every morning.    Historical Provider, MD  PARoxetine (PAXIL) 10 MG tablet Take 10 mg by mouth every morning.    Historical Provider, MD  traMADol (ULTRAM) 50 MG tablet Take 50 mg by mouth daily as needed for pain.    Historical Provider, MD  trimethoprim (TRIMPEX) 100 MG tablet Take 100 mg by mouth at bedtime. 01/08/13   Historical Provider, MD  XARELTO STARTER PACK 15 & 20 MG TBPK Take 15-20 mg by mouth as  directed. Take as directed on package: Start with one  tablet by mouth twice a day with food. On Day 22, switch to one  tablet once a day with food. Patient taking differently: Take 20 mg by mouth as directed. Take as directed on package: Start with one  tablet by mouth twice a day with food. On Day 22, switch to one  tablet once a day with food. 06/24/14   Layla Maw Ward, DO    Social History:  reports that she has never smoked. She has never used smokeless tobacco. She reports that she does not drink alcohol or use illicit  drugs.  Family History  Problem Relation Age of Onset  . Diabetes Mother     Ceasar Mons Weights   04/29/15 0313  Weight: 63.504 kg (140 lb)    Review of Systems:  As in the history of present illness, rest of the review of systems negative   Physical Exam: Blood pressure 125/105, pulse 99, temperature 98.2 F (36.8 C), resp. rate 21, height  (1.499 m), weight 63.504 kg (140 lb), SpO2 94 %. Constitutional:   Patient is a well-developed and well-nourished female* in no acute distress and cooperative with exam. Head: Normocephalic and atraumatic Mouth: Mucus membranes moist Eyes: PERRL, EOMI, conjunctivae normal Neck: Supple, No Thyromegaly Cardiovascular: RRR, S1 normal, S2 normal Pulmonary/Chest: CTAB, no wheezes, rales, or rhonchi Abdominal: Soft. Non-tender, non-distended, bowel sounds are normal, no masses, organomegaly, or guarding present.  Neurological: A&O x3, Strength is normal and symmetric bilaterally, cranial nerve II-XII are grossly intact, no focal motor deficit, sensory intact to light touch bilaterally.  Extremities : No Cyanosis, Clubbing, induration noted in the left hamstring, no erythema or warmth.  Labs on Admission:  Basic Metabolic Panel:  Recent Labs Lab 04/29/15 0320  NA 130*  K 5.4*  CL 100*  CO2 21*  GLUCOSE 115*  BUN 21*  CREATININE 1.45*  CALCIUM 8.5*   CBC:  Recent Labs Lab 04/29/15 0320  WBC 7.4  NEUTROABS 4.3  HGB 4.5*  HCT 15.6*  MCV 71.2*  PLT 214   Cardiac Enzymes: No results for input(s): CKTOTAL, CKMB, CKMBINDEX, TROPONINI in the last 168 hours.  BNP (last 3 results) No results for input(s): BNP in the last 8760 hours.  ProBNP (last 3 results)  Recent Labs  06/21/14 2351 06/24/14 1601  PROBNP 756.5* 463.1*    CBG: No results for input(s): GLUCAP in the last 168 hours.  Radiological Exams on Admission: No results found.     Assessment/Plan Active Problems:   Parkinson disease   GI bleeding    Anemia  Anemia Likely from GI bleed, also concern for hematoma. Will check CT abdomen pelvis to rule out hematoma or retroperitoneal hemorrhage. Will hold the Xarelto. Transfuse 2 units PRBCs. Patient has positive FOBT, will start Protonix 40 mg every 12 hours Check H&H every 8 hours Morphine 1 mg every 4 hours when necessary for pain  Parkinson disease Continue carbidopa levodopa     Code status: DNR  Family discussion: Admission, patients condition and plan of care including tests being ordered have been discussed with the patient and *her daughter at bedside who indicate understanding and agree with the plan and Code Status.   Time Spent on Admission: 55 min  Carollynn Pennywell S [facility] Pager: [phone] 04/29/2015, 5:04 AM  If 7PM-7AM, please contact night-coverage  [email or web address]  Password [email or web address]

## 2015-04-29 NOTE — Progress Notes (Signed)
Patient short of breath with some wheezing at this time. Dr. Ouida SillsFagan notified. CXR ordered and 20 mg of Lasix. Patient c/o left leg pain and Morphine given also. Will continue to monitor patient.

## 2015-04-29 NOTE — ED Notes (Signed)
Went to lab to get blood, personnel not available at this time, will try back

## 2015-04-30 LAB — CBC
HCT: 25.7 % — ABNORMAL LOW (ref 36.0–46.0)
Hemoglobin: 8 g/dL — ABNORMAL LOW (ref 12.0–15.0)
MCH: 24.2 pg — ABNORMAL LOW (ref 26.0–34.0)
MCHC: 31.1 g/dL (ref 30.0–36.0)
MCV: 77.9 fL — AB (ref 78.0–100.0)
PLATELETS: 171 10*3/uL (ref 150–400)
RBC: 3.3 MIL/uL — AB (ref 3.87–5.11)
RDW: 20.4 % — ABNORMAL HIGH (ref 11.5–15.5)
WBC: 5.7 10*3/uL (ref 4.0–10.5)

## 2015-04-30 LAB — TYPE AND SCREEN
ABO/RH(D): B POS
Antibody Screen: NEGATIVE
Unit division: 0
Unit division: 0

## 2015-04-30 LAB — COMPREHENSIVE METABOLIC PANEL
ALT: 6 U/L — ABNORMAL LOW (ref 14–54)
AST: 24 U/L (ref 15–41)
Albumin: 3.2 g/dL — ABNORMAL LOW (ref 3.5–5.0)
Alkaline Phosphatase: 86 U/L (ref 38–126)
Anion gap: 7 (ref 5–15)
BUN: 17 mg/dL (ref 6–20)
CHLORIDE: 103 mmol/L (ref 101–111)
CO2: 27 mmol/L (ref 22–32)
CREATININE: 1.15 mg/dL — AB (ref 0.44–1.00)
Calcium: 8.4 mg/dL — ABNORMAL LOW (ref 8.9–10.3)
GFR calc Af Amer: 47 mL/min — ABNORMAL LOW (ref >60)
GFR, EST NON AFRICAN AMERICAN: 41 mL/min — AB (ref >60)
Glucose, Bld: 102 mg/dL — ABNORMAL HIGH (ref 65–99)
Potassium: 4.2 mmol/L (ref 3.5–5.1)
SODIUM: 137 mmol/L (ref 135–145)
Total Bilirubin: 0.7 mg/dL (ref 0.3–1.2)
Total Protein: 6.8 g/dL (ref 6.5–8.1)

## 2015-04-30 MED ORDER — PANTOPRAZOLE SODIUM 40 MG PO TBEC
40.0000 mg | DELAYED_RELEASE_TABLET | Freq: Every day | ORAL | Status: DC
  Administered 2015-04-30 – 2015-05-01 (×2): 40 mg via ORAL
  Filled 2015-04-30 (×2): qty 1

## 2015-04-30 MED ORDER — CETYLPYRIDINIUM CHLORIDE 0.05 % MT LIQD
7.0000 mL | Freq: Two times a day (BID) | OROMUCOSAL | Status: DC
  Administered 2015-04-30 (×2): 7 mL via OROMUCOSAL

## 2015-04-30 MED ORDER — ACETAMINOPHEN 325 MG PO TABS
650.0000 mg | ORAL_TABLET | Freq: Four times a day (QID) | ORAL | Status: DC | PRN
  Administered 2015-05-01: 650 mg via ORAL
  Filled 2015-04-30: qty 2

## 2015-04-30 NOTE — Progress Notes (Signed)
Subjective: [name] is breathing comfortably this morning. She has had some left leg pain. Ultrasound of her lower extremities revealed no DVT. She received 2 units of packed red cells yesterday. The nursing staff noted some wheezing after her second unit. She was treated with Lasix 20 mg IV. Oxygen saturation now is 94-96%. Chest x-ray revealed probable pulmonary edema.  Objective: Vital signs in last 24 hours: Filed Vitals:   04/29/15 1400 04/29/15 1412 04/29/15 2246 04/30/15 0618  BP: 151/70 158/73 148/71 132/73  Pulse: 86 84 77 84  Temp: 98.7 F (37.1 C) 98.6 F (37 C) 98.2 F (36.8 C) 98.2 F (36.8 C)  TempSrc:   Oral Oral  Resp: 18 20 18 18   Height:      Weight:      SpO2:   95% 94%   Weight change:   Intake/Output Summary (Last 24 hours) at 04/30/15 0735 Last data filed at 04/29/15 1405  Gross per 24 hour  Intake    860 ml  Output      0 ml  Net    860 ml    Physical Exam: No distress. Lungs clear. Heart regular with no murmurs. Abdomen is soft and nontender. The left thigh is nontender on palpation.  Lab Results:    Results for orders placed or performed during the hospital encounter of 04/29/15 (from the past 24 hour(s))  CBC     Status: Abnormal   Collection Time: 04/30/15  5:45 AM  Result Value Ref Range   WBC 5.7 4.0 - 10.5 K/uL   RBC 3.30 (L) 3.87 - 5.11 MIL/uL   Hemoglobin 8.0 (L) 12.0 - 15.0 g/dL   HCT 91.4 (L) 78.2 - 95.6 %   MCV 77.9 (L) 78.0 - 100.0 fL   MCH 24.2 (L) 26.0 - 34.0 pg   MCHC 31.1 30.0 - 36.0 g/dL   RDW 21.3 (H) 08.6 - 57.8 %   Platelets 171 150 - 400 K/uL  Comprehensive metabolic panel     Status: Abnormal   Collection Time: 04/30/15  5:45 AM  Result Value Ref Range   Sodium 137 135 - 145 mmol/L   Potassium 4.2 3.5 - 5.1 mmol/L   Chloride 103 101 - 111 mmol/L   CO2 27 22 - 32 mmol/L   Glucose, Bld 102 (H) 65 - 99 mg/dL   BUN 17 6 - 20 mg/dL   Creatinine, Ser 4.69 (H) 0.44 - 1.00 mg/dL   Calcium 8.4 (L) 8.9 - 10.3 mg/dL    Total Protein 6.8 6.5 - 8.1 g/dL   Albumin 3.2 (L) 3.5 - 5.0 g/dL   AST 24 15 - 41 U/L   ALT 6 (L) 14 - 54 U/L   Alkaline Phosphatase 86 38 - 126 U/L   Total Bilirubin 0.7 0.3 - 1.2 mg/dL   GFR calc non Af Amer 41 (L) >60 mL/min   GFR calc Af Amer 47 (L) >60 mL/min   Anion gap 7 5 - 15     ABGS No results for input(s): PHART, PO2ART, TCO2, HCO3 in the last 72 hours.  Invalid input(s): PCO2 CULTURES No results found for this or any previous visit (from the past 240 hour(s)). Studies/Results: Ct Abdomen Pelvis Wo Contrast  04/29/2015   ADDENDUM REPORT: 04/29/2015 06:44  ADDENDUM: Requested clarification on the topic of anemia: There is no hemorrhage in the abdomen or pelvis.   Electronically Signed   By: Marnee Spring M.D.   On: 04/29/2015 06:44   04/29/2015  CLINICAL DATA:  Anemia.  Retroperitoneal bleed.  EXAM: CT ABDOMEN AND PELVIS WITHOUT CONTRAST  TECHNIQUE: Multidetector CT imaging of the abdomen and pelvis was performed following the standard protocol without IV contrast.  COMPARISON:  06/08/2011  FINDINGS: BODY WALL: Moderate anasarca.  LOWER CHEST: Extensive coronary atherosclerosis.  Small bilateral pleural effusion with lower lobe atelectasis. There is a moderate sliding hiatal hernia.  ABDOMEN/PELVIS:  Liver: 16 mm cyst in the right liver.  No acute findings.  Biliary: No evidence of biliary obstruction or stone.  Pancreas: Unremarkable.  Spleen: Unremarkable.  Adrenals: Unremarkable.  Kidneys and ureters: Bilateral renal cortical thinning with cysts measuring up to 44 mm on the right. No hydronephrosis.  Bladder: Lobulated/trabeculated bladder with moderate distension. Appearance is stable from previous and there is no superimposed inflammatory change.  Reproductive: Hysterectomy.  Negative adnexa.  Bowel: No obstruction. No inflammatory bowel wall thickening. Mild distal colonic diverticulosis.  Retroperitoneum: No hematoma.  No adenopathy.  Peritoneum: No ascites or  pneumoperitoneum.  Vascular: IVC filter noted.  No acute findings.  OSSEOUS: Advanced lumbar degenerative disc and facet disease with dextroscoliosis.  IMPRESSION: 1. No explanation for anemia, including retroperitoneal hemorrhage. 2. Small bilateral pleural effusion with atelectasis. 3. Multiple incidental findings are stable from 2012 and noted above.  Electronically Signed: By: Marnee Spring M.D. On: 04/29/2015 05:36   US Venous Img Lower Bilateral  04/29/2015   CLINICAL DATA:  Acute onset of left lower extremity pain. Personal history of right lower extremity DVT. Initial encounter.  EXAM: BILATERAL LOWER EXTREMITY VENOUS DOPPLER ULTRASOUND  TECHNIQUE: Gray-scale sonography with graded compression, as well as color Doppler and duplex ultrasound were performed to evaluate the lower extremity deep venous systems from the level of the common femoral vein and including the common femoral, femoral, profunda femoral, popliteal and calf veins including the posterior tibial, peroneal and gastrocnemius veins when visible. The superficial great saphenous vein was also interrogated. Spectral Doppler was utilized to evaluate flow at rest and with distal augmentation maneuvers in the common femoral, femoral and popliteal veins.  COMPARISON:  None.  FINDINGS: RIGHT LOWER EXTREMITY  Common Femoral Vein: No evidence of thrombus. Normal compressibility, respiratory phasicity and response to augmentation.  Saphenofemoral Junction: No evidence of thrombus. Normal compressibility and flow on color Doppler imaging.  Profunda Femoral Vein: No evidence of thrombus. Normal compressibility and flow on color Doppler imaging.  Femoral Vein: No evidence of thrombus. Normal compressibility, respiratory phasicity and response to augmentation.  Popliteal Vein: No evidence of thrombus. Normal compressibility, respiratory phasicity and response to augmentation.  Calf Veins: No evidence of thrombus. Normal compressibility and flow on color  Doppler imaging.  Superficial Great Saphenous Vein: No evidence of thrombus. Normal compressibility and flow on color Doppler imaging.  Venous Reflux:  None.  Other Findings:  None.  LEFT LOWER EXTREMITY  Common Femoral Vein: No evidence of thrombus. Normal compressibility, respiratory phasicity and response to augmentation.  Saphenofemoral Junction: No evidence of thrombus. Normal compressibility and flow on color Doppler imaging.  Profunda Femoral Vein: No evidence of thrombus. Normal compressibility and flow on color Doppler imaging.  Femoral Vein: No evidence of thrombus. Normal compressibility, respiratory phasicity and response to augmentation.  Popliteal Vein: No evidence of thrombus. Normal compressibility, respiratory phasicity and response to augmentation.  Calf Veins: No evidence of thrombus. Normal compressibility and flow on color Doppler imaging.  Superficial Great Saphenous Vein: No evidence of thrombus. Normal compressibility and flow on color Doppler imaging.  Venous Reflux:  None.  Other Findings:  None.  IMPRESSION: No evidence of deep venous thrombosis.   Electronically Signed   By: Roanna Raider M.D.   On: 04/29/2015 17:32   Dg Chest Port 1 View  04/29/2015   CLINICAL DATA:  Patient with shortness of breath and weakness.  EXAM: PORTABLE CHEST - 1 VIEW  COMPARISON:  Chest radiograph 06/24/2014  FINDINGS: Multiple monitoring leads overlie the patient. Enlarged cardiac and mediastinal contours. Tortuosity of the thoracic aorta. Interval development of asymmetric right-greater-than-left heterogeneous pulmonary opacities. Possible small bilateral pleural effusions. No definite pneumothorax.  IMPRESSION: Cardiomegaly.  Right-greater-than-left heterogeneous opacities may resent asymmetric pulmonary edema or infection.  Probable small bilateral pleural effusions.   Electronically Signed   By: Annia Belt M.D.   On: 04/29/2015 16:16   Micro Results: No results found for this or any previous visit  (from the past 240 hour(s)). Studies/Results: Ct Abdomen Pelvis Wo Contrast  04/29/2015   ADDENDUM REPORT: 04/29/2015 06:44  ADDENDUM: Requested clarification on the topic of anemia: There is no hemorrhage in the abdomen or pelvis.   Electronically Signed   By: Marnee Spring M.D.   On: 04/29/2015 06:44   04/29/2015   CLINICAL DATA:  Anemia.  Retroperitoneal bleed.  EXAM: CT ABDOMEN AND PELVIS WITHOUT CONTRAST  TECHNIQUE: Multidetector CT imaging of the abdomen and pelvis was performed following the standard protocol without IV contrast.  COMPARISON:  06/08/2011  FINDINGS: BODY WALL: Moderate anasarca.  LOWER CHEST: Extensive coronary atherosclerosis.  Small bilateral pleural effusion with lower lobe atelectasis. There is a moderate sliding hiatal hernia.  ABDOMEN/PELVIS:  Liver: 16 mm cyst in the right liver.  No acute findings.  Biliary: No evidence of biliary obstruction or stone.  Pancreas: Unremarkable.  Spleen: Unremarkable.  Adrenals: Unremarkable.  Kidneys and ureters: Bilateral renal cortical thinning with cysts measuring up to 44 mm on the right. No hydronephrosis.  Bladder: Lobulated/trabeculated bladder with moderate distension. Appearance is stable from previous and there is no superimposed inflammatory change.  Reproductive: Hysterectomy.  Negative adnexa.  Bowel: No obstruction. No inflammatory bowel wall thickening. Mild distal colonic diverticulosis.  Retroperitoneum: No hematoma.  No adenopathy.  Peritoneum: No ascites or pneumoperitoneum.  Vascular: IVC filter noted.  No acute findings.  OSSEOUS: Advanced lumbar degenerative disc and facet disease with dextroscoliosis.  IMPRESSION: 1. No explanation for anemia, including retroperitoneal hemorrhage. 2. Small bilateral pleural effusion with atelectasis. 3. Multiple incidental findings are stable from 2012 and noted above.  Electronically Signed: By: Marnee Spring M.D. On: 04/29/2015 05:36   US Venous Img Lower Bilateral  04/29/2015   CLINICAL  DATA:  Acute onset of left lower extremity pain. Personal history of right lower extremity DVT. Initial encounter.  EXAM: BILATERAL LOWER EXTREMITY VENOUS DOPPLER ULTRASOUND  TECHNIQUE: Gray-scale sonography with graded compression, as well as color Doppler and duplex ultrasound were performed to evaluate the lower extremity deep venous systems from the level of the common femoral vein and including the common femoral, femoral, profunda femoral, popliteal and calf veins including the posterior tibial, peroneal and gastrocnemius veins when visible. The superficial great saphenous vein was also interrogated. Spectral Doppler was utilized to evaluate flow at rest and with distal augmentation maneuvers in the common femoral, femoral and popliteal veins.  COMPARISON:  None.  FINDINGS: RIGHT LOWER EXTREMITY  Common Femoral Vein: No evidence of thrombus. Normal compressibility, respiratory phasicity and response to augmentation.  Saphenofemoral Junction: No evidence of thrombus. Normal compressibility and flow on color Doppler imaging.  Profunda Femoral  Vein: No evidence of thrombus. Normal compressibility and flow on color Doppler imaging.  Femoral Vein: No evidence of thrombus. Normal compressibility, respiratory phasicity and response to augmentation.  Popliteal Vein: No evidence of thrombus. Normal compressibility, respiratory phasicity and response to augmentation.  Calf Veins: No evidence of thrombus. Normal compressibility and flow on color Doppler imaging.  Superficial Great Saphenous Vein: No evidence of thrombus. Normal compressibility and flow on color Doppler imaging.  Venous Reflux:  None.  Other Findings:  None.  LEFT LOWER EXTREMITY  Common Femoral Vein: No evidence of thrombus. Normal compressibility, respiratory phasicity and response to augmentation.  Saphenofemoral Junction: No evidence of thrombus. Normal compressibility and flow on color Doppler imaging.  Profunda Femoral Vein: No evidence of thrombus.  Normal compressibility and flow on color Doppler imaging.  Femoral Vein: No evidence of thrombus. Normal compressibility, respiratory phasicity and response to augmentation.  Popliteal Vein: No evidence of thrombus. Normal compressibility, respiratory phasicity and response to augmentation.  Calf Veins: No evidence of thrombus. Normal compressibility and flow on color Doppler imaging.  Superficial Great Saphenous Vein: No evidence of thrombus. Normal compressibility and flow on color Doppler imaging.  Venous Reflux:  None.  Other Findings:  None.  IMPRESSION: No evidence of deep venous thrombosis.   Electronically Signed   By: Roanna RaiderJeffery  Chang M.D.   On: 04/29/2015 17:32   Dg Chest Port 1 View  04/29/2015   CLINICAL DATA:  Patient with shortness of breath and weakness.  EXAM: PORTABLE CHEST - 1 VIEW  COMPARISON:  Chest radiograph 06/24/2014  FINDINGS: Multiple monitoring leads overlie the patient. Enlarged cardiac and mediastinal contours. Tortuosity of the thoracic aorta. Interval development of asymmetric right-greater-than-left heterogeneous pulmonary opacities. Possible small bilateral pleural effusions. No definite pneumothorax.  IMPRESSION: Cardiomegaly.  Right-greater-than-left heterogeneous opacities may resent asymmetric pulmonary edema or infection.  Probable small bilateral pleural effusions.   Electronically Signed   By: Annia Beltrew  Davis M.D.   On: 04/29/2015 16:16   Medications:  I have reviewed the patient's current medications Scheduled Meds: . antiseptic oral rinse  7 mL Mouth Rinse BID  . carbidopa-levodopa  1 tablet Oral TID  . mirtazapine  15 mg Oral QHS  . pantoprazole  40 mg Oral Daily  . PARoxetine  10 mg Oral q morning - 10a  . sodium chloride  3 mL Intravenous Q12H  . trimethoprim  100 mg Oral QHS   Continuous Infusions:  PRN Meds:.sodium chloride, acetaminophen, morphine injection, ondansetron **OR** ondansetron (ZOFRAN) IV, sodium chloride   Assessment/Plan: #1. Anemia. GI  bleed. Hemoglobin has improved to 8.0. No further transfusions are required now. Continue holding Xarelto. Continue Protonix. Switch to oral. Recheck CBC tomorrow. #2. History of DVT and PE. IVC filter in place. #3. Volume overload. Now breathing comfortably after 1 dose of Lasix. #4. Renal insufficiency. Creatinine has improved from 1.4-1.1. Electrolytes are normal. #5. Parkinson's disease. Physical therapy consult today for her left leg symptoms. #6. Protein calorie malnutrition. Albumen is 3.2. Active Problems:   Parkinson disease   GI bleeding   Anemia     LOS: 1 day   [name] 04/30/2015, 7:35 AM

## 2015-04-30 NOTE — Progress Notes (Signed)
PT Cancellation Note  Patient Details Name: Amy BorrowHallie W Gilcrease MRN: [id number] DOB: 04/06/1923   Cancelled Treatment:    Reason Eval/Treat Not Completed: Fatigue/lethargy limiting ability to participate. Chart reviewed, RN consulted. Holding pt treatment at this time due to pt unable to maintain eyes open for >5 seconds. Pt is difficult to rouse. Unable to understand slurred speech at this time. Will attempt to ascertain baseline info from family. Will attempt evaluation at later date/time.      Nallely Yost C 04/30/2015, 11:08 AM  11:10 AM  Rosamaria LintsAllan C Deshannon Hinchliffe, PT, DPT [place] License # [id number]

## 2015-04-30 NOTE — Care Management Note (Signed)
Case Management Note  Patient Details  Name: [name] MRN: [id number] Date of Birth: 02/17/1923  Subjective/Objective:                  Pt admitted from home with gi bleeding. Pt lives at home with 24 hour care with sitters. Pt also has a daughter who is very active in the care of the pt. Pt has a walker and lift chair for home use. According to family at the bedside, pt does not do much ambulating.  Action/Plan: Awaiting PT consult. Will attempt to contact pts daughter about futher discharge plans.  Expected Discharge Date:  05/01/15               Expected Discharge Plan:  Home/Self Care  In-House Referral:  NA  Discharge planning Services  CM Consult  Post Acute Care Choice:    Choice offered to:     DME Arranged:    DME Agency:     HH Arranged:    HH Agency:     Status of Service:  In process, will continue to follow  Medicare Important Message Given:    Date Medicare IM Given:    Medicare IM give by:    Date Additional Medicare IM Given:    Additional Medicare Important Message give by:     If discussed at Long Length of Stay Meetings, dates discussed:    Additional Comments:  Cheryl FlashBlackwell, Amenda Duclos Crowder, RN 04/30/2015, 11:14 AM

## 2015-05-01 LAB — CBC
HCT: 25 % — ABNORMAL LOW (ref 36.0–46.0)
Hemoglobin: 7.7 g/dL — ABNORMAL LOW (ref 12.0–15.0)
MCH: 24.4 pg — ABNORMAL LOW (ref 26.0–34.0)
MCHC: 30.8 g/dL (ref 30.0–36.0)
MCV: 79.1 fL (ref 78.0–100.0)
PLATELETS: 160 10*3/uL (ref 150–400)
RBC: 3.16 MIL/uL — ABNORMAL LOW (ref 3.87–5.11)
RDW: 21.2 % — ABNORMAL HIGH (ref 11.5–15.5)
WBC: 4.7 10*3/uL (ref 4.0–10.5)

## 2015-05-01 MED ORDER — FERROUS SULFATE 325 (65 FE) MG PO TABS: 325.0000 mg | ORAL_TABLET | Freq: Every day | ORAL | Status: DC

## 2015-05-01 MED ORDER — BACLOFEN 10 MG PO TABS: 5.0000 mg | ORAL_TABLET | Freq: Three times a day (TID) | ORAL | Status: AC

## 2015-05-01 MED ORDER — CEPHALEXIN 250 MG PO CAPS
250.0000 mg | ORAL_CAPSULE | Freq: Three times a day (TID) | ORAL | Status: DC
  Administered 2015-05-01: 250 mg via ORAL
  Filled 2015-05-01: qty 1

## 2015-05-01 MED ORDER — OMEPRAZOLE 10 MG PO CPDR: 20.0000 mg | DELAYED_RELEASE_CAPSULE | Freq: Every morning | ORAL | Status: AC

## 2015-05-01 MED ORDER — CEPHALEXIN 250 MG PO CAPS: 250.0000 mg | ORAL_CAPSULE | Freq: Three times a day (TID) | ORAL | Status: DC

## 2015-05-01 NOTE — Care Management (Signed)
Important Message  Patient Details  Name: [name] MRN: [id number] Date of Birth: 12/13/1922   Medicare Important Message Given:  N/A - LOS <3 / Initial given by admissions    Cheryl FlashBlackwell, Takelia Urieta Crowder, RN 05/01/2015, 9:17 AM

## 2015-05-01 NOTE — Discharge Summary (Signed)
Physician Discharge Summary   SNUFFER ZOX:[id number] DOB: [birth year] DOA: 04/29/2015   Admit date: 04/29/2015 Discharge date: 05/01/2015  Discharge Diagnoses: #1. Iron deficiency anemia. #2. GI bleed of unknown etiology. #3. Parkinson's disease. #4. Chronic kidney disease stage III. #5. Protein calorie malnutrition. #6. History of DVT and IVC filter placement. Active Problems:   Parkinson disease   GI bleeding   Anemia    Wt Readings from Last 3 Encounters:  04/29/15 126 lb 8.7 oz (57.4 kg)  06/21/14 145 lb (65.772 kg)  06/19/14 144 lb 6.4 oz (65.5 kg)     Hospital Course:  This patient is a 81 year old female who presented to the emergency room with left leg pain. She was found to be markedly anemic with a hemoglobin of 4.5 with microcytosis. She was Hemoccult-positive. She has been chronically anticoagulated with Xarelto because of recurrent clotting. She has an IVC filter in place. She underwent a CT scan of the abdomen which revealed no evidence of underlying malignancy. The family did not wish to pursue diagnostic evaluation with GI consultation or endoscopic studies. Her hemoglobin improved to 8 following her transfusions with 2 units of packed red cells. She did develop some wheezing following transfusion and was treated with IV Lasix. She had no other signs of bleeding. She remained hemodynamically stable. She is evidence of protein calorie malnutrition with a low albumen of 3.2. Creatinine was initially elevated at 1.4 but improved to 1.1 with an estimated GFR of 41 consistent with chronic kidney disease stage III. Physical therapy was consulted for assistance with her developing left leg contracture. She'll be treated with baclofen on an as-needed basis.  Xarelto will be discontinued. She will continue PPI therapy with omeprazole. A daily iron supplement will be started. Physical therapy and home health will assist her at home and will recheck a CBC in one week. Condition at  discharge is much improved. DO NOT RESUSCITATE orders in place.   Discharge Instructions     Medication List    STOP taking these medications        rivaroxaban 20 MG Tabs tablet  Commonly known as:  XARELTO      TAKE these medications        acetaminophen 325 MG tablet  Commonly known as:  TYLENOL  Take 650 mg by mouth 2 (two) times daily. Also given as needed for mild pain in addition to twice daily     baclofen 10 MG tablet  Commonly known as:  LIORESAL  Take 0.5 tablets (5 mg total) by mouth 3 (three) times daily.     carbidopa-levodopa 25-100 MG per tablet  Commonly known as:  SINEMET  Take 1 tablet by mouth 3 (three) times daily.     ferrous sulfate 325 (65 FE) MG tablet  Take 1 tablet (325 mg total) by mouth daily with breakfast.     LORazepam 0.5 MG tablet  Commonly known as:  ATIVAN  Take 1 tablet (0.5 mg total) by mouth every 6 (six) hours as needed for anxiety.     mirtazapine 15 MG tablet  Commonly known as:  REMERON  Take 15 mg by mouth at bedtime.     omeprazole 10 MG capsule  Commonly known as:  PRILOSEC  Take 2 capsules (20 mg total) by mouth every morning.     PARoxetine 10 MG tablet  Commonly known as:  PAXIL  Take 10 mg by mouth every morning.     traMADol 50 MG tablet  Commonly known  as:  ULTRAM  Take 50 mg by mouth daily as needed for pain.     trimethoprim 100 MG tablet  Commonly known as:  TRIMPEX  Take 100 mg by mouth at bedtime.         Nakenya Theall 05/01/2015

## 2015-05-01 NOTE — Care Management Note (Signed)
Case Management Note  Patient Details  Name: [name] MRN: [id number] Date of Birth: 11/01/1922  Subjective/Objective:                    Action/Plan:   Expected Discharge Date:  05/01/15               Expected Discharge Plan:  Home w Home Health Services  In-House Referral:  NA  Discharge planning Services  CM Consult  Post Acute Care Choice:  Home Health Choice offered to:  Adult Children  DME Arranged:    DME Agency:     HH Arranged:  RN, PT HH Agency:  [facility]  Status of Service:  Completed, signed off  Medicare Important Message Given:    Date Medicare IM Given:    Medicare IM give by:    Date Additional Medicare IM Given:    Additional Medicare Important Message give by:     If discussed at Long Length of Stay Meetings, dates discussed:    Additional Comments: [facility]per pts daughter choice). Alroy BailiffLinda Lothian [facility] aware and will collect the pts information from the chart. HH services to start within 48 hours of discharge. No DME needs noted. Pt does not qualify for home O2 at this time. Pts daughter and pts nurse aware of discharge arrangements. Arlyss QueenBlackwell, Cierrah Dace Wilkesbororowder, RN 05/01/2015, 9:15 AM

## 2015-05-01 NOTE — Progress Notes (Signed)
Pt assisted to up right position in chair from bed.  Incontinent episode at this time, urine noted to be whitish yellow in color with milky consistency.  Malodorous.  Dr. Ouida SillsFagan paged and made aware.  New order received for urine culture and Cephalexin 250 mg TID.  Pt is still to be discharge this afternoon, transported home by [employer] [facility].

## 2015-05-01 NOTE — Progress Notes (Signed)
Pt discharged home today per Dr. Ouida SillsFagan.  Pt's IV site D/C'd and WDL.  Pts' VSS.  Pt's daughter provided with home medication list, discharge instructions and prescriptions.  Verbalized understanding.  Pt left floor via WC in stable condition accompanied by daughter and at home sitter.

## 2015-05-08 ENCOUNTER — Other Ambulatory Visit (HOSPITAL_COMMUNITY)
Admission: RE | Admit: 2015-05-08 | Discharge: 2015-05-08 | Disposition: A | Discharge status: Home / Self Care | Payer: PPO | Source: Other Acute Inpatient Hospital | Attending: Internal Medicine | Admitting: Internal Medicine

## 2015-05-08 DIAGNOSIS — Z5181 Encounter for therapeutic drug level monitoring: Secondary | ICD-10-CM | POA: Insufficient documentation

## 2015-05-08 DIAGNOSIS — N390 Urinary tract infection, site not specified: Secondary | ICD-10-CM | POA: Insufficient documentation

## 2015-05-08 LAB — CBC
HEMATOCRIT: 30.1 % — AB (ref 36.0–46.0)
Hemoglobin: 8.9 g/dL — ABNORMAL LOW (ref 12.0–15.0)
MCH: 24.3 pg — AB (ref 26.0–34.0)
MCHC: 29.6 g/dL — AB (ref 30.0–36.0)
MCV: 82.2 fL (ref 78.0–100.0)
PLATELETS: 184 10*3/uL (ref 150–400)
RBC: 3.66 MIL/uL — AB (ref 3.87–5.11)
RDW: 25.2 % — ABNORMAL HIGH (ref 11.5–15.5)
WBC: 5.7 10*3/uL (ref 4.0–10.5)

## 2015-05-15 ENCOUNTER — Other Ambulatory Visit (HOSPITAL_COMMUNITY)
Admission: AD | Admit: 2015-05-15 | Discharge: 2015-05-15 | Disposition: A | Discharge status: Home / Self Care | Payer: PPO | Source: Other Acute Inpatient Hospital | Attending: Internal Medicine | Admitting: Internal Medicine

## 2015-05-15 DIAGNOSIS — N390 Urinary tract infection, site not specified: Secondary | ICD-10-CM | POA: Diagnosis present

## 2015-05-15 LAB — URINALYSIS, ROUTINE W REFLEX MICROSCOPIC
BILIRUBIN URINE: NEGATIVE
Glucose, UA: NEGATIVE mg/dL
KETONES UR: NEGATIVE mg/dL
Nitrite: NEGATIVE
PROTEIN: NEGATIVE mg/dL
UROBILINOGEN UA: 0.2 mg/dL (ref 0.0–1.0)
pH: 6 (ref 5.0–8.0)

## 2015-05-15 LAB — URINE MICROSCOPIC-ADD ON

## 2015-05-18 LAB — URINE CULTURE

## 2015-05-21 ENCOUNTER — Encounter: Payer: Self-pay | Admitting: Nurse Practitioner

## 2015-05-21 ENCOUNTER — Ambulatory Visit (INDEPENDENT_AMBULATORY_CARE_PROVIDER_SITE_OTHER): Payer: PPO | Admitting: Nurse Practitioner

## 2015-05-21 VITALS — BP 101/64 | HR 84

## 2015-05-21 DIAGNOSIS — G20 Parkinson's disease: Secondary | ICD-10-CM

## 2015-05-21 DIAGNOSIS — R413 Other amnesia: Secondary | ICD-10-CM

## 2015-05-21 DIAGNOSIS — R269 Unspecified abnormalities of gait and mobility: Secondary | ICD-10-CM | POA: Diagnosis not present

## 2015-05-21 NOTE — Patient Instructions (Signed)
Continue Sinemet once a day does not need refills Baclofen can be taken twice a day this may help her spasticity Continue physical therapy Continue range of motion every day Follow-up in 6 months

## 2015-05-21 NOTE — Progress Notes (Signed)
[facility]  PATIENT: [name] DOB: Jan 23, 1923   REASON FOR VISIT: Follow-up for Parkinson's disease and memory loss  HISTORY FROM: Daughter and caregiver  HISTORY OF PRESENT ILLNESS:Ms. Burr, 81 year old female returns for followup with her daughter. She lives at home, with care giver 24x7. Last clinical visit with Dr. Terrace Arabia 11/12/2014. She has a history of Parkinson's disease, her tremor is under well controlled. She is seated in wheelchair and talks very little. She has had no overt falls. Appetite is reportedly good. She is very hard of hearing. She is only taking Sinemet once daily she was admitted to the hospital July 5 and discharged on July 17 admitting hemoglobin was 4.5. She received a couple units of blood and Xarelto was stopped.  Her memory score is worse today however she is extremely hard of hearing . She is also being treated for urinary tract infection She has also been placed on baclofen for lower extremity spasticity. She is basically nonambulatory She returns for reevaluation.  HISTORY: She gradually noticed a right-hand resting tremor around 2010, She complains of falling over past 2 years as well, difficulty turning over in bed. She has had some falls but has fortunately not been injured. She denies loss of smell. She has vivid dreams. She has some constipation.  She lives independently but her daughter checks on her often. Her daughter prepares foods and brings to her. She has never driven. She admits to some short -term memory loss. She has noted mild dyspnea on exertion in the past year. She has a walker and cane at home but her daughter tells me that she doesn't use them when she goes out. She is assisted by her family when she leaves her home.  She had normal blood studies with the exception of a mildly decreased VItamin D level. She had an MRI which revealed diffuse atrophy, ventriculomegaly and mild subcortical white matter disease.  She was  started on Sinemet 25/100 ii tid, and has noted improvement in her condition. She has tolerated this well without side effects.   UPDATE Oct 16th 2015: She is no longer ambulatory, more confusion, memory trouble, She continues to take her Sinemet 25/100 mg, 2 tablets 3 times a day, no significant side effects,  She recently developed right lower extremity DVT, is on long-term anticoagulation xarelto  UPDATE Jan 19th 2016: She is on lower dose of Sinemet, 25/100 mg 4 times a day, with lowering dose, there was no significant change, she is wheelchair bound, sleepy, occasionally evening time agitations    REVIEW OF SYSTEMS: Full 14 system review of systems performed and notable only for those listed, all others are neg:  Constitutional: neg  Cardiovascular: neg Ear/Nose/Throat: Hearing loss Skin: neg Eyes: neg Respiratory: neg Gastroitestinal: Urinary frequency,   Hematology/Lymphatic: Easy bruising Endocrine: Flushing Musculoskeletal: Joint pain, back pain, walking difficulty Allergy/Immunology: neg Neurological: Weakness Psychiatric: Depression and anxiety  Sleep : neg   ALLERGIES: Allergies  Allergen Reactions  . Reglan [Metoclopramide] Other (See Comments)    Tremors really bad   . Ambien [Zolpidem]   . Metoclopramide Hcl Other (See Comments)    Affected Nervous system- severe jerkiing, shaking, etc..    HOME MEDICATIONS: Outpatient Prescriptions Prior to Visit  Medication Sig Dispense Refill  . acetaminophen (TYLENOL) 325 MG tablet Take 650 mg by mouth 2 (two) times daily. Also given as needed for mild pain in addition to twice daily    . baclofen (LIORESAL) 10 MG tablet Take 0.5  tablets (5 mg total) by mouth 3 (three) times daily. 30 each 0  . carbidopa-levodopa (SINEMET) 25-100 MG per tablet Take 1 tablet by mouth 3 (three) times daily. 90 tablet 11  . cephALEXin (KEFLEX) 250 MG capsule Take 1 capsule (250 mg total) by mouth 3 (three) times daily. 30 capsule 0  .  ferrous sulfate 325 (65 FE) MG tablet Take 1 tablet (325 mg total) by mouth daily with breakfast. 30 tablet 3  . LORazepam (ATIVAN) 0.5 MG tablet Take 1 tablet (0.5 mg total) by mouth every 6 (six) hours as needed for anxiety. 30 tablet 3  . mirtazapine (REMERON) 15 MG tablet Take 15 mg by mouth at bedtime.      Marland Kitchen omeprazole (PRILOSEC) 10 MG capsule Take 2 capsules (20 mg total) by mouth every morning. 30 capsule 12  . PARoxetine (PAXIL) 10 MG tablet Take 10 mg by mouth every morning.    . traMADol (ULTRAM) 50 MG tablet Take 50 mg by mouth daily as needed for pain.    Marland Kitchen trimethoprim (TRIMPEX) 100 MG tablet Take 100 mg by mouth at bedtime.     No facility-administered medications prior to visit.    PAST MEDICAL HISTORY: Past Medical History  Diagnosis Date  . Hypertension   . GERD (gastroesophageal reflux disease)   . Parkinson disease   . Depression   . Pulmonary embolism   . Arthritis   . Hypercalcemia   . Diabetes mellitus     family denies    PAST SURGICAL HISTORY: Past Surgical History  Procedure Laterality Date  . Esophagogastroduodenoscopy  06/10/2011    Procedure: ESOPHAGOGASTRODUODENOSCOPY (EGD);  Surgeon: Malissa Hippo, MD;  [facility];  Service: Endoscopy;  Laterality: N/A;  . Vena cava filter placement    . Esophagogastroduodenoscopy  07/29/2011    Procedure: ESOPHAGOGASTRODUODENOSCOPY (EGD);  Surgeon: Malissa Hippo, MD;  [facility]: [facility];  Service: Endoscopy;  Laterality: N/A;  . Laparoscopic nissen fundoplication  08/09/2011    Procedure: LAPAROSCOPIC NISSEN FUNDOPLICATION;  Surgeon: Fabio Bering;  Location: [facility];  Service: General;  Laterality: N/A;  Laparoscopic Hiatal Hernia Repair  . Gastrostomy  08/09/2011    Procedure: GASTROSTOMY;  Surgeon: Fabio Bering;  Location: [facility];  Service: General;  Laterality: Left;  Laparoscopic Gastrostomy Tube placement  . Hernia repair      FAMILY HISTORY: Family History  Problem Relation Age  of Onset  . Diabetes Mother     SOCIAL HISTORY: History   Social History  . Marital Status: Widowed    Spouse Name: N/A  . Number of Children: 1  . Years of Education: 5   Occupational History  . Not on file.   Social History Main Topics  . Smoking status: Never Smoker   . Smokeless tobacco: Never Used  . Alcohol Use: No  . Drug Use: No  . Sexual Activity: Not on file   Other Topics Concern  . Not on file   Social History Narrative   Patient is living at home with caregivers.    Patient is widowed.    Patient has 1 child.    Patient is retired.    Patient has a 5th grade education.            PHYSICAL EXAM  Filed Vitals:   05/21/15 1317  BP: 101/64  Pulse: 84   There is no weight on file to calculate BMI. Generalized: Well developed, in no acute distress  Head: normocephalic  and atraumatic,. Oropharynx benign  Neck: Supple, no carotid bruits  Neurological examination   Mentation: Alert MMSE 6/30, she is not oriented to time and place, missed 3 out of 3 recalls, has difficulty spell world backwards, difficulty with naming.   Cranial nerve II-XII:Pupils were equal round reactive to light extraocular movements were full, visual field were full on confrontational test. Facial sensation and strength were normal. Hard of hearing. Uvula tongue midline. head turning and shoulder shrug were normal and symmetric.Tongue protrusion into cheek strength was normal. Motor: She has mild fixed contraction of bilateral knee, maximum 170, bilateral ankle plantar flexion limited range of motion, moderate right more than left upper and lower extremity rigidity, no significant upper extremity weakness. Coordination: finger-nose-finger, no dysmetria Reflexes: Brachioradialis 2/2, biceps 2/2, triceps 2/2, patellar 2/2, Achilles 1/1, plantar responses were flexor bilaterally. Gait and Station: In wheelchair not ambulated  DIAGNOSTIC DATA (LABS, IMAGING, TESTING) - I reviewed  patient records, labs, notes, testing and imaging myself where available.  Lab Results  Component Value Date   WBC 5.7 05/08/2015   HGB 8.9* 05/08/2015   HCT 30.1* 05/08/2015   MCV 82.2 05/08/2015   PLT 184 05/08/2015      Component Value Date/Time   NA 137 04/30/2015 0545   K 4.2 04/30/2015 0545   CL 103 04/30/2015 0545   CO2 27 04/30/2015 0545   GLUCOSE 102* 04/30/2015 0545   BUN 17 04/30/2015 0545   CREATININE 1.15* 04/30/2015 0545   CALCIUM 8.4* 04/30/2015 0545   PROT 6.8 04/30/2015 0545   ALBUMIN 3.2* 04/30/2015 0545   AST 24 04/30/2015 0545   ALT 6* 04/30/2015 0545   ALKPHOS 86 04/30/2015 0545   BILITOT 0.7 04/30/2015 0545   GFRNONAA 41* 04/30/2015 0545   GFRAA 47* 04/30/2015 0545    ASSESSMENT AND PLAN  81 y.o. year old female  has a past medical history of Hypertension;  Parkinson disease; Depression; and memory loss. Currently being treated for a urinary tract infection . MMSE today 6/30, last 10 out of 30. She is nonambulatory and only taking Sinemet once daily. Recent admission for hemoglobin of 4.5. Reviewed hospital record  Continue Sinemet once a day does not need refills Baclofen can be taken twice a day this may help her spasticity Continue physical therapy Continue range of motion every day Follow-up in 6 months Nilda Riggs, [facility], [facility], APRN  [facility] [address], [address] [place], [place] [address] [phone]

## 2015-05-23 NOTE — Progress Notes (Signed)
I have reviewed and agreed above plan. 

## 2015-10-24 ENCOUNTER — Other Ambulatory Visit: Payer: Self-pay | Admitting: Neurology

## 2015-10-31 DIAGNOSIS — N3000 Acute cystitis without hematuria: Secondary | ICD-10-CM | POA: Diagnosis not present

## 2015-11-20 ENCOUNTER — Ambulatory Visit (INDEPENDENT_AMBULATORY_CARE_PROVIDER_SITE_OTHER): Payer: PPO | Admitting: Nurse Practitioner

## 2015-11-20 ENCOUNTER — Encounter: Payer: Self-pay | Admitting: Nurse Practitioner

## 2015-11-20 VITALS — BP 105/68 | HR 71

## 2015-11-20 DIAGNOSIS — R269 Unspecified abnormalities of gait and mobility: Secondary | ICD-10-CM

## 2015-11-20 DIAGNOSIS — G20 Parkinson's disease: Secondary | ICD-10-CM

## 2015-11-20 DIAGNOSIS — R413 Other amnesia: Secondary | ICD-10-CM

## 2015-11-20 MED ORDER — CARBIDOPA-LEVODOPA 25-100 MG PO TABS: ORAL_TABLET | ORAL | Status: DC

## 2015-11-20 NOTE — Progress Notes (Signed)
[facility]  PATIENT: [name] DOB: [birth year]   REASON FOR VISIT: follow up for memory loss Parkinson's disease abnormality of gait HISTORY FROM:caregiver and daughter    HISTORY OF PRESENT ILLNESS:[name], 82 year old female returns for followup with her daughter. She lives at home, with care giver 24x7. Last clinical visit 05/21/15.  She has a history of Parkinson's disease, her tremor is  well controlled. She is seated in wheelchair and talks very little. She has had no overt falls.  Appetite is reportedly good. She is very hard of hearing. She is  taking Sinemet three times daily.Last  hospitalization July 5 and discharged on July 17 /2016 admitting hemoglobin was 4.5. She received a couple units of blood and Xarelto was stopped. She is unable to answer questions for memory testing. She is hard of hearing . She has also been placed on baclofen for lower extremity spasticity. She is basically nonambulatory.She has not ambulated in over 7 years She returns for reevaluation.  HISTORY: She gradually noticed a right-hand resting tremor around 2010, She complains of falling over past 2 years as well, difficulty turning over in bed. She has had some falls but has fortunately not been injured. She denies loss of smell. She has vivid dreams. She has some constipation.  She lives independently but her daughter checks on her often. Her daughter prepares foods and brings to her. She has never driven. She admits to some short -term memory loss. She has noted mild dyspnea on exertion in the past year. She has a walker and cane at home but her daughter tells me that she doesn't use them when she goes out. She is assisted by her family when she leaves her home.  She had normal blood studies with the exception of a mildly decreased VItamin D level. She had an MRI which revealed diffuse atrophy, ventriculomegaly and mild subcortical white matter disease.  She was started on  Sinemet 25/100 ii tid, and has noted improvement in her condition. She has tolerated this well without side effects.   UPDATE Jan 19th 2016: She is on lower dose of Sinemet, 25/100 mg 4 times a day, with lowering dose, there was no significant change, she is wheelchair bound, sleepy, occasionally evening time agitations      REVIEW OF SYSTEMS: Full 14 system review of systems performed and notable only for those listed, all others are neg:  Constitutional: neg  Cardiovascular: neg Ear/Nose/Throat: neg  Skin: neg Eyes: neg Respiratory: shortness of breath Gastroitestinal:Frequent UTIs Hematology/Lymphatic: easy bruising Endocrine: neg Musculoskeletal:walking difficulty Allergy/Immunology: neg Neurological: memory loss Psychiatric: neg Sleep : neg   ALLERGIES: Allergies  Allergen Reactions  . Reglan [Metoclopramide] Other (See Comments)    Tremors really bad   . Ambien [Zolpidem]   . Metoclopramide Hcl Other (See Comments)    Affected Nervous system- severe jerkiing, shaking, etc..    HOME MEDICATIONS: Outpatient Prescriptions Prior to Visit  Medication Sig Dispense Refill  . acetaminophen (TYLENOL) 325 MG tablet Take 650 mg by mouth 2 (two) times daily. Also given as needed for mild pain in addition to twice daily    . baclofen (LIORESAL) 10 MG tablet Take 0.5 tablets (5 mg total) by mouth 3 (three) times daily. (Patient taking differently: Take 5 mg by mouth 2 (two) times daily. ) 30 each 0  . carbidopa-levodopa (SINEMET IR) 25-100 MG tablet TAKE 1 TABLET BY MOUTH THREE TIMES DAILY. 90 tablet 0  . LORazepam (ATIVAN) 0.5 MG tablet Take  1 tablet (0.5 mg total) by mouth every 6 (six) hours as needed for anxiety. 30 tablet 3  . mirtazapine (REMERON) 15 MG tablet Take 15 mg by mouth at bedtime.      Marland Kitchen omeprazole (PRILOSEC) 10 MG capsule Take 2 capsules (20 mg total) by mouth every morning. 30 capsule 12  . PARoxetine (PAXIL) 10 MG tablet Take 10 mg by mouth every morning.      . traMADol (ULTRAM) 50 MG tablet Take 50 mg by mouth daily as needed for pain.    Marland Kitchen trimethoprim (TRIMPEX) 100 MG tablet Take 100 mg by mouth at bedtime.    . cephALEXin (KEFLEX) 250 MG capsule Take 1 capsule (250 mg total) by mouth 3 (three) times daily. 30 capsule 0  . ferrous sulfate 325 (65 FE) MG tablet Take 1 tablet (325 mg total) by mouth daily with breakfast. 30 tablet 3   No facility-administered medications prior to visit.    PAST MEDICAL HISTORY: Past Medical History  Diagnosis Date  . Hypertension   . GERD (gastroesophageal reflux disease)   . Parkinson disease (HCC)   . Depression   . Pulmonary embolism (HCC)   . Arthritis   . Hypercalcemia   . Diabetes mellitus     family denies    PAST SURGICAL HISTORY: Past Surgical History  Procedure Laterality Date  . Esophagogastroduodenoscopy  06/10/2011    Procedure: ESOPHAGOGASTRODUODENOSCOPY (EGD);  Surgeon: Malissa Hippo, MD;  [facility];  Service: Endoscopy;  Laterality: N/A;  . Vena cava filter placement    . Esophagogastroduodenoscopy  07/29/2011    Procedure: ESOPHAGOGASTRODUODENOSCOPY (EGD);  Surgeon: Malissa Hippo, MD;  [facility];  Service: Endoscopy;  Laterality: N/A;  . Laparoscopic nissen fundoplication  08/09/2011    Procedure: LAPAROSCOPIC NISSEN FUNDOPLICATION;  Surgeon: Fabio Bering;  Location: [facility];  Service: General;  Laterality: N/A;  Laparoscopic Hiatal Hernia Repair  . Gastrostomy  08/09/2011    Procedure: GASTROSTOMY;  Surgeon: Fabio Bering;  Location: [facility];  Service: General;  Laterality: Left;  Laparoscopic Gastrostomy Tube placement  . Hernia repair      FAMILY HISTORY: Family History  Problem Relation Age of Onset  . Diabetes Mother     SOCIAL HISTORY: Social History   Social History  . Marital Status: Widowed    Spouse Name: N/A  . Number of Children: 1  . Years of Education: 5   Occupational History  . Not on file.   Social History Main  Topics  . Smoking status: Never Smoker   . Smokeless tobacco: Never Used  . Alcohol Use: No  . Drug Use: No  . Sexual Activity: Not on file   Other Topics Concern  . Not on file   Social History Narrative   Patient is living at home with caregivers.    Patient is widowed.    Patient has 1 child.    Patient is retired.    Patient has a 5th grade education.            PHYSICAL EXAM  Filed Vitals:   11/20/15 1343  BP: 105/68  Pulse: 71   There is no weight on file to calculate BMI. Generalized: Well developed, in no acute distress  Head: normocephalic and atraumatic,. Oropharynx benign  Neck: Supple, no carotid bruits  Neurological examination   Mentation: Alert Unable to perform MMSE last MMSE 6/30, she is not oriented to time and place,    Cranial nerve  II-XII:Pupils were equal round reactive to light extraocular movements were full, visual field were full on confrontational test. Facial sensation and strength were normal. Hard of hearing. Uvula tongue midline. head turning and shoulder shrug were normal and symmetric.Tongue protrusion into cheek strength was normal. Motor: She has mild fixed contraction of bilateral knee, maximum 170, bilateral ankle plantar flexion limited range of motion, moderate right more than left upper and lower extremity rigidity, no significant upper extremity weakness. Coordination: finger-nose-finger, ataxia Gait and Station: In wheelchair not ambulated DIAGNOSTIC DATA (LABS, IMAGING, TESTING) - I reviewed patient records, labs, notes, testing and imaging myself where available.  Lab Results  Component Value Date   WBC 5.7 05/08/2015   HGB 8.9* 05/08/2015   HCT 30.1* 05/08/2015   MCV 82.2 05/08/2015   PLT 184 05/08/2015      Component Value Date/Time   NA 137 04/30/2015 0545   K 4.2 04/30/2015 0545   CL 103 04/30/2015 0545   CO2 27 04/30/2015 0545   GLUCOSE 102* 04/30/2015 0545   BUN 17 04/30/2015 0545   CREATININE 1.15*  04/30/2015 0545   CALCIUM 8.4* 04/30/2015 0545   PROT 6.8 04/30/2015 0545   ALBUMIN 3.2* 04/30/2015 0545   AST 24 04/30/2015 0545   ALT 6* 04/30/2015 0545   ALKPHOS 86 04/30/2015 0545   BILITOT 0.7 04/30/2015 0545   GFRNONAA 41* 04/30/2015 0545   GFRAA 47* 04/30/2015 0545    ASSESSMENT AND PLAN 82 y.o. year old female has a past medical history of Hypertension; Parkinson disease; Depression; and memory loss. . Unable to obtain MMSE. Last MMSE  6/30,  She is nonambulatory .   Continue Sinemet will refill Baclofen can be taken twice a day this may help her spasticity thru PCP Continue range of motion every day Follow-up in 6 months next with Dr. Carmelina Noun, [facility], [facility], APRN  [facility] [address], [address] [place], [place] [address] [phone]

## 2015-11-20 NOTE — Patient Instructions (Signed)
Sinemet once daily.  Continue Sinemet once a day does not need refills Baclofen can be taken twice a day this may help her spasticity Continue range of motion every day Follow-up in 6 monthsnext with Dr. Terrace Arabia

## 2015-11-20 NOTE — Progress Notes (Signed)
I have reviewed and agreed above plan. 

## 2015-11-22 DIAGNOSIS — I10 Essential (primary) hypertension: Secondary | ICD-10-CM | POA: Diagnosis not present

## 2015-11-22 DIAGNOSIS — G20 Parkinson's disease: Secondary | ICD-10-CM | POA: Diagnosis not present

## 2015-11-22 DIAGNOSIS — M539 Dorsopathy, unspecified: Secondary | ICD-10-CM | POA: Diagnosis not present

## 2015-11-22 DIAGNOSIS — M0549 Rheumatoid myopathy with rheumatoid arthritis of multiple sites: Secondary | ICD-10-CM | POA: Diagnosis not present

## 2015-11-25 DIAGNOSIS — N3000 Acute cystitis without hematuria: Secondary | ICD-10-CM | POA: Diagnosis not present

## 2015-12-27 ENCOUNTER — Encounter (HOSPITAL_COMMUNITY): Payer: Self-pay

## 2015-12-27 ENCOUNTER — Inpatient Hospital Stay (HOSPITAL_COMMUNITY)
Admission: EM | Admit: 2015-12-27 | Discharge: 2016-01-02 | DRG: 689 | Disposition: A | Discharge status: Hospice | Payer: PPO | Attending: Internal Medicine | Admitting: Internal Medicine

## 2015-12-27 ENCOUNTER — Emergency Department (HOSPITAL_COMMUNITY): Payer: PPO

## 2015-12-27 DIAGNOSIS — G9341 Metabolic encephalopathy: Secondary | ICD-10-CM | POA: Diagnosis present

## 2015-12-27 DIAGNOSIS — Z515 Encounter for palliative care: Secondary | ICD-10-CM | POA: Insufficient documentation

## 2015-12-27 DIAGNOSIS — R633 Feeding difficulties: Secondary | ICD-10-CM | POA: Diagnosis not present

## 2015-12-27 DIAGNOSIS — K219 Gastro-esophageal reflux disease without esophagitis: Secondary | ICD-10-CM | POA: Diagnosis not present

## 2015-12-27 DIAGNOSIS — J069 Acute upper respiratory infection, unspecified: Secondary | ICD-10-CM | POA: Diagnosis present

## 2015-12-27 DIAGNOSIS — Z87440 Personal history of urinary (tract) infections: Secondary | ICD-10-CM | POA: Diagnosis not present

## 2015-12-27 DIAGNOSIS — Z833 Family history of diabetes mellitus: Secondary | ICD-10-CM | POA: Diagnosis not present

## 2015-12-27 DIAGNOSIS — Z66 Do not resuscitate: Secondary | ICD-10-CM | POA: Diagnosis present

## 2015-12-27 DIAGNOSIS — R404 Transient alteration of awareness: Secondary | ICD-10-CM | POA: Diagnosis not present

## 2015-12-27 DIAGNOSIS — R0600 Dyspnea, unspecified: Secondary | ICD-10-CM | POA: Diagnosis not present

## 2015-12-27 DIAGNOSIS — B9620 Unspecified Escherichia coli [E. coli] as the cause of diseases classified elsewhere: Secondary | ICD-10-CM | POA: Diagnosis not present

## 2015-12-27 DIAGNOSIS — E119 Type 2 diabetes mellitus without complications: Secondary | ICD-10-CM | POA: Diagnosis present

## 2015-12-27 DIAGNOSIS — G20A1 Parkinson's disease without dyskinesia, without mention of fluctuations: Secondary | ICD-10-CM | POA: Diagnosis present

## 2015-12-27 DIAGNOSIS — E875 Hyperkalemia: Secondary | ICD-10-CM | POA: Diagnosis present

## 2015-12-27 DIAGNOSIS — I959 Hypotension, unspecified: Secondary | ICD-10-CM

## 2015-12-27 DIAGNOSIS — Z993 Dependence on wheelchair: Secondary | ICD-10-CM

## 2015-12-27 DIAGNOSIS — R1310 Dysphagia, unspecified: Secondary | ICD-10-CM | POA: Diagnosis not present

## 2015-12-27 DIAGNOSIS — G20 Parkinson's disease: Secondary | ICD-10-CM | POA: Diagnosis not present

## 2015-12-27 DIAGNOSIS — G9340 Encephalopathy, unspecified: Secondary | ICD-10-CM | POA: Diagnosis not present

## 2015-12-27 DIAGNOSIS — M25512 Pain in left shoulder: Secondary | ICD-10-CM | POA: Diagnosis not present

## 2015-12-27 DIAGNOSIS — R195 Other fecal abnormalities: Secondary | ICD-10-CM | POA: Diagnosis present

## 2015-12-27 DIAGNOSIS — N179 Acute kidney failure, unspecified: Secondary | ICD-10-CM | POA: Diagnosis not present

## 2015-12-27 DIAGNOSIS — Z86711 Personal history of pulmonary embolism: Secondary | ICD-10-CM | POA: Diagnosis not present

## 2015-12-27 DIAGNOSIS — Z23 Encounter for immunization: Secondary | ICD-10-CM | POA: Diagnosis not present

## 2015-12-27 DIAGNOSIS — M25559 Pain in unspecified hip: Secondary | ICD-10-CM | POA: Diagnosis not present

## 2015-12-27 DIAGNOSIS — A499 Bacterial infection, unspecified: Secondary | ICD-10-CM

## 2015-12-27 DIAGNOSIS — R0989 Other specified symptoms and signs involving the circulatory and respiratory systems: Secondary | ICD-10-CM | POA: Diagnosis not present

## 2015-12-27 DIAGNOSIS — R6510 Systemic inflammatory response syndrome (SIRS) of non-infectious origin without acute organ dysfunction: Secondary | ICD-10-CM

## 2015-12-27 DIAGNOSIS — Z7189 Other specified counseling: Secondary | ICD-10-CM | POA: Insufficient documentation

## 2015-12-27 DIAGNOSIS — R4182 Altered mental status, unspecified: Secondary | ICD-10-CM | POA: Diagnosis not present

## 2015-12-27 DIAGNOSIS — A419 Sepsis, unspecified organism: Secondary | ICD-10-CM

## 2015-12-27 DIAGNOSIS — N390 Urinary tract infection, site not specified: Principal | ICD-10-CM

## 2015-12-27 DIAGNOSIS — R531 Weakness: Secondary | ICD-10-CM | POA: Diagnosis present

## 2015-12-27 DIAGNOSIS — I10 Essential (primary) hypertension: Secondary | ICD-10-CM | POA: Diagnosis not present

## 2015-12-27 HISTORY — DX: Anemia, unspecified: D64.9

## 2015-12-27 HISTORY — DX: Gastrointestinal hemorrhage, unspecified: K92.2

## 2015-12-27 HISTORY — DX: Displaced fracture of lateral malleolus of unspecified fibula, initial encounter for closed fracture: S82.63XA

## 2015-12-27 LAB — CBC WITH DIFFERENTIAL/PLATELET
Basophils Absolute: 0 10*3/uL (ref 0.0–0.1)
Basophils Relative: 0 %
EOS ABS: 0 10*3/uL (ref 0.0–0.7)
EOS PCT: 0 %
HCT: 34.9 % — ABNORMAL LOW (ref 36.0–46.0)
Hemoglobin: 11.7 g/dL — ABNORMAL LOW (ref 12.0–15.0)
LYMPHS ABS: 0.5 10*3/uL — AB (ref 0.7–4.0)
Lymphocytes Relative: 3 %
MCH: 33.5 pg (ref 26.0–34.0)
MCHC: 33.5 g/dL (ref 30.0–36.0)
MCV: 100 fL (ref 78.0–100.0)
Monocytes Absolute: 0.6 10*3/uL (ref 0.1–1.0)
Monocytes Relative: 4 %
Neutro Abs: 16.1 10*3/uL — ABNORMAL HIGH (ref 1.7–7.7)
Neutrophils Relative %: 93 %
PLATELETS: 266 10*3/uL (ref 150–400)
RBC: 3.49 MIL/uL — ABNORMAL LOW (ref 3.87–5.11)
RDW: 13.9 % (ref 11.5–15.5)
WBC: 17.3 10*3/uL — AB (ref 4.0–10.5)

## 2015-12-27 LAB — INFLUENZA PANEL BY PCR (TYPE A & B)
H1N1 flu by pcr: NOT DETECTED
Influenza A By PCR: NEGATIVE
Influenza B By PCR: NEGATIVE

## 2015-12-27 LAB — BASIC METABOLIC PANEL
Anion gap: 10 (ref 5–15)
BUN: 23 mg/dL — ABNORMAL HIGH (ref 6–20)
CALCIUM: 8.9 mg/dL (ref 8.9–10.3)
CO2: 20 mmol/L — AB (ref 22–32)
CREATININE: 1.66 mg/dL — AB (ref 0.44–1.00)
Chloride: 104 mmol/L (ref 101–111)
GFR, EST AFRICAN AMERICAN: 30 mL/min — AB (ref >60)
GFR, EST NON AFRICAN AMERICAN: 26 mL/min — AB (ref >60)
Glucose, Bld: 98 mg/dL (ref 65–99)
Potassium: 4.9 mmol/L (ref 3.5–5.1)
SODIUM: 134 mmol/L — AB (ref 135–145)

## 2015-12-27 LAB — URINE MICROSCOPIC-ADD ON

## 2015-12-27 LAB — TROPONIN I: Troponin I: <0.03 ng/mL (ref <0.031)

## 2015-12-27 LAB — HEPATIC FUNCTION PANEL
ALT: 5 U/L — ABNORMAL LOW (ref 14–54)
AST: 18 U/L (ref 15–41)
Albumin: 3.5 g/dL (ref 3.5–5.0)
Alkaline Phosphatase: 131 U/L — ABNORMAL HIGH (ref 38–126)
BILIRUBIN INDIRECT: 0.3 mg/dL (ref 0.3–0.9)
BILIRUBIN TOTAL: 0.4 mg/dL (ref 0.3–1.2)
Bilirubin, Direct: 0.1 mg/dL (ref 0.1–0.5)
TOTAL PROTEIN: 7.4 g/dL (ref 6.5–8.1)

## 2015-12-27 LAB — URINALYSIS, ROUTINE W REFLEX MICROSCOPIC
BILIRUBIN URINE: NEGATIVE
Glucose, UA: NEGATIVE mg/dL
KETONES UR: NEGATIVE mg/dL
NITRITE: NEGATIVE
PH: 6.5 (ref 5.0–8.0)
PROTEIN: 30 mg/dL — AB
Specific Gravity, Urine: 1.01 (ref 1.005–1.030)

## 2015-12-27 LAB — LACTIC ACID, PLASMA: LACTIC ACID, VENOUS: 1.3 mmol/L (ref 0.5–2.0)

## 2015-12-27 MED ORDER — POTASSIUM CHLORIDE IN NACL 20-0.9 MEQ/L-% IV SOLN
INTRAVENOUS | Status: DC
  Administered 2015-12-27: 16:00:00 via INTRAVENOUS

## 2015-12-27 MED ORDER — PAROXETINE HCL 20 MG PO TABS
10.0000 mg | ORAL_TABLET | Freq: Every morning | ORAL | Status: DC
  Administered 2015-12-29 – 2016-01-01 (×3): 10 mg via ORAL
  Filled 2015-12-27: qty 0.5
  Filled 2015-12-27 (×2): qty 1
  Filled 2015-12-27 (×2): qty 0.5
  Filled 2015-12-27: qty 1

## 2015-12-27 MED ORDER — BACLOFEN 10 MG PO TABS
5.0000 mg | ORAL_TABLET | Freq: Two times a day (BID) | ORAL | Status: DC
  Administered 2015-12-29 – 2016-01-01 (×7): 5 mg via ORAL
  Filled 2015-12-27 (×7): qty 1

## 2015-12-27 MED ORDER — CARBIDOPA-LEVODOPA 25-100 MG PO TABS
1.0000 | ORAL_TABLET | Freq: Three times a day (TID) | ORAL | Status: DC
  Administered 2015-12-29 – 2016-01-01 (×10): 1 via ORAL
  Filled 2015-12-27 (×14): qty 1

## 2015-12-27 MED ORDER — ACETAMINOPHEN 325 MG PO TABS: 650.0000 mg | ORAL_TABLET | Freq: Four times a day (QID) | ORAL | Status: DC | PRN

## 2015-12-27 MED ORDER — CEFTRIAXONE SODIUM 1 G IJ SOLR
1.0000 g | Freq: Once | INTRAMUSCULAR | Status: AC
  Administered 2015-12-27: 1 g via INTRAVENOUS
  Filled 2015-12-27: qty 10

## 2015-12-27 MED ORDER — ALBUTEROL SULFATE (2.5 MG/3ML) 0.083% IN NEBU: 2.5000 mg | INHALATION_SOLUTION | RESPIRATORY_TRACT | Status: DC | PRN

## 2015-12-27 MED ORDER — ACETAMINOPHEN 650 MG RE SUPP: 650.0000 mg | Freq: Four times a day (QID) | RECTAL | Status: DC | PRN

## 2015-12-27 MED ORDER — ONDANSETRON HCL 4 MG/2ML IJ SOLN: 4.0000 mg | Freq: Four times a day (QID) | INTRAMUSCULAR | Status: DC | PRN

## 2015-12-27 MED ORDER — DEXTROSE 5 % IV SOLN
1.0000 g | INTRAVENOUS | Status: DC
  Administered 2015-12-28 – 2016-01-02 (×6): 1 g via INTRAVENOUS
  Filled 2015-12-27 (×9): qty 10

## 2015-12-27 MED ORDER — SODIUM CHLORIDE 0.9 % IV BOLUS (SEPSIS)
500.0000 mL | Freq: Once | INTRAVENOUS | Status: AC
Start: 2015-12-27 — End: 2015-12-27
  Administered 2015-12-27: 500 mL via INTRAVENOUS

## 2015-12-27 MED ORDER — ONDANSETRON HCL 4 MG PO TABS: 4.0000 mg | ORAL_TABLET | Freq: Four times a day (QID) | ORAL | Status: DC | PRN

## 2015-12-27 MED ORDER — PANTOPRAZOLE SODIUM 40 MG IV SOLR
40.0000 mg | INTRAVENOUS | Status: DC
  Administered 2015-12-27 – 2015-12-31 (×3): 40 mg via INTRAVENOUS
  Filled 2015-12-27 (×4): qty 40

## 2015-12-27 MED ORDER — MORPHINE SULFATE (CONCENTRATE) 10 MG/0.5ML PO SOLN
5.0000 mg | ORAL | Status: DC | PRN
  Administered 2015-12-27 (×2): 10 mg via ORAL
  Filled 2015-12-27 (×2): qty 0.5

## 2015-12-27 MED ORDER — MORPHINE SULFATE (PF) 2 MG/ML IV SOLN: 0.5000 mg | INTRAVENOUS | Status: DC | PRN

## 2015-12-27 MED ORDER — ENOXAPARIN SODIUM 40 MG/0.4ML ~~LOC~~ SOLN
40.0000 mg | SUBCUTANEOUS | Status: DC
  Administered 2015-12-27: 40 mg via SUBCUTANEOUS
  Filled 2015-12-27: qty 0.4

## 2015-12-27 MED ORDER — SODIUM CHLORIDE 0.9 % IV BOLUS (SEPSIS)
500.0000 mL | Freq: Once | INTRAVENOUS | Status: AC
  Administered 2015-12-27: 500 mL via INTRAVENOUS

## 2015-12-27 MED ORDER — INFLUENZA VAC SPLIT QUAD 0.5 ML IM SUSY
0.5000 mL | PREFILLED_SYRINGE | INTRAMUSCULAR | Status: DC
  Filled 2015-12-27: qty 0.5

## 2015-12-27 MED ORDER — FOSFOMYCIN TROMETHAMINE 3 G PO PACK: 3.0000 g | PACK | Freq: Once | ORAL | Status: DC

## 2015-12-27 NOTE — Progress Notes (Signed)
[id number] Spoke with Dr.Fagan regarding patient crying out and moaning in pain. Patient has no current IV access and multiple attempts made to gain new IV access w/o success. Family at bedside at this time requesting patient not be "stuck" again tonight d/t patient already irritated and moaning/crying in pain. Dr.Fagan gave new orders to d/c current IV Morphine order and give Morphine concentrate solution 5-10mg  PO PRN for pain. Family at bedside made aware.

## 2015-12-27 NOTE — ED Notes (Signed)
MD at bedside. 

## 2015-12-27 NOTE — ED Provider Notes (Signed)
CSN: [id number]     Arrival date & time 12/27/15  1058 History  By signing my name below, I, Placido Sou, attest that this documentation has been prepared under the direction and in the presence of Gerhard Munch, MD. Electronically Signed: Placido Sou, ED Scribe. 12/27/2015. 12:55 PM.    Chief Complaint  Patient presents with  . Weakness  . Cough   The history is provided by a relative. No language interpreter was used.    HPI Comments: [name] is a 82 y.o. female with a PMHx including PE, DM and Parkinson Disease who presents to the Emergency Department by ambulance complaining of worsening, generalized, weakness onset 2 weeks ago. Her daughter states that she has been dealing with cold-like symptoms for ~2 weeks including cough, congestion, rhinorrhea and beginning yesterday, 1x SOB after ambulating from the restroom which is typical but was more severe than nml, as well. Her daughter notes that she normally is conversational which has gradually decreased over the past 2 weeks and currently barely speaks. Pt continuously groans which they say is baseline but not normally as audible. Pt was recently diagnosed with a UTI and finished her abx ~1 week ago. Her family denies she has experienced any other associated symptoms at this time.   On repeat evaluation, after obtaining some clarification from neurology note, the patient has memory loss, his baseline verbal, though with deficits. Patient has Parkinson's disease.    Past Medical History  Diagnosis Date  . Hypertension   . GERD (gastroesophageal reflux disease)   . Parkinson disease (HCC)   . Depression   . Pulmonary embolism (HCC)   . Arthritis   . Hypercalcemia   . Diabetes mellitus     family denies   Past Surgical History  Procedure Laterality Date  . Esophagogastroduodenoscopy  06/10/2011    Procedure: ESOPHAGOGASTRODUODENOSCOPY (EGD);  Surgeon: Malissa Hippo, MD;  Location: [facility];  Service: Endoscopy;   Laterality: N/A;  . Vena cava filter placement    . Esophagogastroduodenoscopy  07/29/2011    Procedure: ESOPHAGOGASTRODUODENOSCOPY (EGD);  Surgeon: Malissa Hippo, MD;  Location: [facility];  Service: Endoscopy;  Laterality: N/A;  . Laparoscopic nissen fundoplication  08/09/2011    Procedure: LAPAROSCOPIC NISSEN FUNDOPLICATION;  Surgeon: Fabio Bering;  Location: [facility];  Service: General;  Laterality: N/A;  Laparoscopic Hiatal Hernia Repair  . Gastrostomy  08/09/2011    Procedure: GASTROSTOMY;  Surgeon: Fabio Bering;  Location: [facility];  Service: General;  Laterality: Left;  Laparoscopic Gastrostomy Tube placement  . Hernia repair     Family History  Problem Relation Age of Onset  . Diabetes Mother    Social History  Substance Use Topics  . Smoking status: Never Smoker   . Smokeless tobacco: Never Used  . Alcohol Use: No   OB History    No data available     Review of Systems  Unable to perform ROS: Patient nonverbal  HENT: Positive for congestion and rhinorrhea.   Neurological: Positive for weakness.    Allergies  Reglan; Ambien; and Metoclopramide hcl  Home Medications   Prior to Admission medications   Medication Sig Start Date End Date Taking? Authorizing Provider  acetaminophen (TYLENOL) 325 MG tablet Take 650 mg by mouth 2 (two) times daily. Also given as needed for mild pain in addition to twice daily   Yes Historical Provider, MD  baclofen (LIORESAL) 10 MG tablet Take 0.5 tablets (5 mg total) by mouth 3 (three)  times daily. Patient taking differently: Take 5 mg by mouth 2 (two) times daily.  05/01/15  Yes Carylon Perches, MD  carbidopa-levodopa (SINEMET IR) 25-100 MG tablet TAKE 1 TABLET BY MOUTH THREE TIMES DAILY. 11/20/15  Yes Nilda Riggs, NP  mirtazapine (REMERON) 15 MG tablet Take 15 mg by mouth at bedtime.     Yes Historical Provider, MD  omeprazole (PRILOSEC) 10 MG capsule Take 2 capsules (20 mg total) by mouth every morning. 05/01/15  Yes Carylon Perches, MD   PARoxetine (PAXIL) 10 MG tablet Take 10 mg by mouth every morning.   Yes Historical Provider, MD  trimethoprim (TRIMPEX) 100 MG tablet Take 100 mg by mouth at bedtime. 01/08/13  Yes Historical Provider, MD  LORazepam (ATIVAN) 0.5 MG tablet Take 1 tablet (0.5 mg total) by mouth every 6 (six) hours as needed for anxiety. 05/09/13   Carylon Perches, MD  traMADol (ULTRAM) 50 MG tablet Take 50 mg by mouth daily as needed for pain.    Historical Provider, MD   BP 97/60 mmHg  Pulse 95  Resp 18  SpO2 98% Physical Exam  Constitutional: She appears listless. She has a sickly appearance.  HENT:  Head: Normocephalic and atraumatic.  Eyes: Conjunctivae and EOM are normal.  Neck: No tracheal deviation present.  Cardiovascular: Normal rate and regular rhythm.   Murmur heard. Pulmonary/Chest: No stridor. No respiratory distress.  Audible rhonchi w respirations  Abdominal: There is no tenderness.  Musculoskeletal: She exhibits no edema.  Neurological: She appears listless.  Patient does not produce.  Neurologic exam, moves with painful stimuli, does not spontaneously offer any verbal interaction  Skin: Skin is warm and dry.  Psychiatric: She is withdrawn. Cognition and memory are impaired. She is noncommunicative.  Nursing note and vitals reviewed.   ED Course  Procedures  DIAGNOSTIC STUDIES: Oxygen Saturation is 98% on RA, normal by my interpretation.    Patient is hypotensive on initial evaluation, 95/50.  COORDINATION OF CARE: 12:22 PM Discussed next steps with pt's family. They verbalized understanding and are agreeable with the plan.   Labs Review Labs Reviewed  BASIC METABOLIC PANEL - Abnormal; Notable for the following:    Sodium 134 (*)    CO2 20 (*)    BUN 23 (*)    Creatinine, Ser 1.66 (*)    GFR calc non Af Amer 26 (*)    GFR calc Af Amer 30 (*)    All other components within normal limits  HEPATIC FUNCTION PANEL - Abnormal; Notable for the following:    ALT 5 (*)    Alkaline  Phosphatase 131 (*)    All other components within normal limits  TROPONIN I  CBC WITH DIFFERENTIAL/PLATELET  URINALYSIS, ROUTINE W REFLEX MICROSCOPIC (NOT AT [facility])    Imaging Review No results found. I have personally reviewed and evaluated these images and lab results as part of my medical decision-making.   EKG Interpretation   Date/Time:  Saturday December 27 2015 11:08:00 EST Ventricular Rate:  102 PR Interval:  152 QRS Duration: 94 QT Interval:  350 QTC Calculation: 456 R Axis:   100 Text Interpretation:  Sinus tachycardia Low voltage, extremity leads Sinus  tachycardia Artifact T wave abnormality Abnormal ekg Confirmed by  Gerhard Munch  MD ([id number]) on 12/27/2015 12:07:26 PM     1:06 PM Following initial fluid bolus, patient is 102/53.  Additional IVF running. Initial labs notable for elevated creatinine.  MDM    I personally performed the services described in  this documentation, which was scribed in my presence. The recorded information has been reviewed and is accurate.    Elderly female with multiple medical issues including Parkinson's disease, memory loss presents with family members who provide history of present illness. Patient is nonverbal, listless, hypotensive on arrival. Patient's son have elevated creatinine, and leukocytosis as well as evidence for urinary tract infection. Given the patient's altered mental status, she started on antibiotics, as well as receiving fluid resuscitation in the emergency department. She was admitted for further evaluation, management.    Gerhard Munchobert Maryssa Giampietro, MD 12/27/15 1311

## 2015-12-27 NOTE — H&P (Signed)
[facility] History and Physical  Amy BorrowHallie W Bjorn ZOX:[id number] DOB: 07/30/1923 DOA: 12/27/2015  Referring physician: ED physician, Dr. Jeraldine LootsLockwood  PCP: Carylon PerchesFAGAN,ROY, MD   Chief Complaint: generalized weakness, lethargy, cough.  HPI: [name] is a 82 y.o. female  with a history of frequent UTIs on suppressive therapy with trimethoprim, Parkinson's disease, DJD, depression, and pulmonary embolism-no longer on anticoagulation-status post IVC filter placement in 2012, who presents to the ED with a number of symptoms including generalized weakness, cough, lethargy, chest congestion, and rhinorrhea. (History is being provided by her daughters and caretaker). Approximately 2 weeks ago, she developed a fever. A urinalysis was ordered and it was consistent with infection. Her PCP, Dr. Ouida SillsFagan prescribed Keflex initially, but when the cultures were confirmed, it was discontinued and she was started on Levaquin. She finished the course last week. Over the past several days, she has had upper respiratory infection symptoms. She has had several loose stools yesterday and one this morning. No nausea or vomiting. There has been no reported chest pain, abdominal pain, or verbal pain with urination. However, she has been moaning/groaning on and off for the past couple days. She has not been interactive as usual. She has had generalized weakness but no focal unilateral weakness per family. She is generally wheelchair-bound.   In the ED, she was intermittently bradycardic and borderline tachycardic. She was afebrile. Her urinalysis revealed large leukocytes, few bacteria, too numerous to count RBCs, and too numerous to count WBCs. Her chest x-ray revealed no focal infiltrate, but the study was limited due to her positioning. Her lab data were significant for BUN of 23, creatinine of 1.66, normal troponin I, WBC of 17.3, and hemoglobin of 11.7. She is being admitted for further evaluation and management.     Review of  Systems:  As above in history present illness. Otherwise, review of systems is negative.   Past Medical History  Diagnosis Date  . Hypertension   . GERD (gastroesophageal reflux disease)   . Parkinson disease (HCC)   . Depression   . Pulmonary embolism (HCC)   . Arthritis   . Hypercalcemia   . Diabetes mellitus     family denies  . GI bleeding 04/29/2015  . Anemia 04/29/2015  . Lateral malleolar fracture 04/30/2013   Past Surgical History  Procedure Laterality Date  . Esophagogastroduodenoscopy  06/10/2011    Procedure: ESOPHAGOGASTRODUODENOSCOPY (EGD);  Surgeon: Malissa HippoNajeeb U [name], MD;  Location: [facility];  Service: Endoscopy;  Laterality: N/A;  . Vena cava filter placement    . Esophagogastroduodenoscopy  07/29/2011    Procedure: ESOPHAGOGASTRODUODENOSCOPY (EGD);  Surgeon: Malissa HippoNajeeb U [name], MD;  Location: [facility];  Service: Endoscopy;  Laterality: N/A;  . Laparoscopic nissen fundoplication  08/09/2011    Procedure: LAPAROSCOPIC NISSEN FUNDOPLICATION;  Surgeon: Fabio BeringBrent C Ziegler;  Location: [facility];  Service: General;  Laterality: N/A;  Laparoscopic Hiatal Hernia Repair  . Gastrostomy  08/09/2011    Procedure: GASTROSTOMY;  Surgeon: Fabio BeringBrent C Ziegler;  Location: [facility];  Service: General;  Laterality: Left;  Laparoscopic Gastrostomy Tube placement  . Hernia repair     Social History: she is widowed. She lives in [place] with 24-7 care of sitters. Her daughter, Darel HongJudy is her primary custodian and POA. Patient has no history of tobacco and alcohol use. She is generally nonambulatory, but can help with transferring.  Wheelchair used as needed.   Allergies  Allergen Reactions  . Reglan [Metoclopramide] Other (See Comments)    Tremors really  bad   . Ambien [Zolpidem]   . Metoclopramide Hcl Other (See Comments)    Affected Nervous system- severe jerkiing, shaking, etc..    Family History  Problem Relation Age of Onset  . Diabetes Mother     Prior to Admission medications     Medication Sig Start Date End Date Taking? Authorizing Provider  acetaminophen (TYLENOL) 325 MG tablet Take 650 mg by mouth 2 (two) times daily. Also given as needed for mild pain in addition to twice daily   Yes Historical Provider, MD  baclofen (LIORESAL) 10 MG tablet Take 0.5 tablets (5 mg total) by mouth 3 (three) times daily. Patient taking differently: Take 5 mg by mouth 2 (two) times daily.  05/01/15  Yes Carylon Perches, MD  carbidopa-levodopa (SINEMET IR) 25-100 MG tablet TAKE 1 TABLET BY MOUTH THREE TIMES DAILY. 11/20/15  Yes Nilda Riggs, NP  mirtazapine (REMERON) 15 MG tablet Take 15 mg by mouth at bedtime.     Yes Historical Provider, MD  omeprazole (PRILOSEC) 10 MG capsule Take 2 capsules (20 mg total) by mouth every morning. 05/01/15  Yes Carylon Perches, MD  PARoxetine (PAXIL) 10 MG tablet Take 10 mg by mouth every morning.   Yes Historical Provider, MD  trimethoprim (TRIMPEX) 100 MG tablet Take 100 mg by mouth at bedtime. 01/08/13  Yes Historical Provider, MD  LORazepam (ATIVAN) 0.5 MG tablet Take 1 tablet (0.5 mg total) by mouth every 6 (six) hours as needed for anxiety. 05/09/13   Carylon Perches, MD  traMADol (ULTRAM) 50 MG tablet Take 50 mg by mouth daily as needed for pain.    Historical Provider, MD   Physical Exam: Filed Vitals:   12/27/15 1200 12/27/15 1215 12/27/15 1234 12/27/15 1300  BP: 97/60  94/51 102/53  Pulse: 95 88 87 83  Resp: SpO2: 98% 95% 94% 93%    Wt Readings from Last 3 Encounters:  04/29/15 57.4 kg (126 lb 8.7 oz)  06/21/14 65.772 kg (145 lb)  06/19/14 65.5 kg (144 lb 6.4 oz)    General:  Small framed elderly 82 year old Caucasian woman who is lethargic, but in no acute distress. There are episodic moments.  Eyes: PERRL, normal lids, irises & conjunctiva; conjunctivae are clear sclerae white.  ENT: oropharynx reveals dry mucous membranes.  Neck: no LAD, masses or thyromegaly Cardiovascular: S1, S2, with a soft systolic murmur.  Telemetry: SR, no  arrhythmias  Respiratory: rare upper airway crackles, no wheezes, decreased breath sounds in the bases, breathing nonlabored.  Abdomen: soft, positive bowel sounds, nontender, nondistended.  Skin: no rash or induration seen on limited exam Musculoskeletal: grossly normal tone BUE/BLE; mild diffuse atrophy; hypertrophic arthritic changes seen in her hands and her knees without any acute hot red joints. No pedal edema.  Psychiatric: lethargic  Neurologic: lethargic. She opens her eyes to voice and tries to answer, but falls back to sleep. No obvious facial droop. Cranial nerves II through XII appear to be grossly intact.           Labs on Admission:  Basic Metabolic Panel:  Recent Labs Lab 12/27/15 1115  NA 134*  K 4.9  CL 104  CO2 20*  GLUCOSE 98  BUN 23*  CREATININE 1.66*  CALCIUM 8.9   Liver Function Tests:  Recent Labs Lab 12/27/15 1115  AST 18  ALT 5*  ALKPHOS 131*  BILITOT 0.4  PROT 7.4  ALBUMIN 3.5   No results for input(s): LIPASE, AMYLASE in the  last 168 hours. No results for input(s): AMMONIA in the last 168 hours. CBC:  Recent Labs Lab 12/27/15 1115  WBC 17.3*  NEUTROABS 16.1*  HGB 11.7*  HCT 34.9*  MCV 100.0  PLT 266   Cardiac Enzymes:  Recent Labs Lab 12/27/15 1115  TROPONINI <0.03    BNP (last 3 results) No results for input(s): BNP in the last 8760 hours.  ProBNP (last 3 results) No results for input(s): PROBNP in the last 8760 hours.  CBG: No results for input(s): GLUCAP in the last 168 hours.  Radiological Exams on Admission: Dg Chest 2 View  12/27/2015  CLINICAL DATA:  Generalized weakness and chest congestion. EXAM: CHEST  2 VIEW COMPARISON:  April 29, 2015 FINDINGS: The study is limited due to patient rotation. No pneumothorax. The cardiomediastinal silhouette is stable. A rounded density in the region of the right hilum is probably a vessel on end. This finding is stable. Increased interstitial markings on the right versus the  left, probably due to patient rotation. No focal infiltrates identified. No masses. The thoracic spine is not well assessed due to osteopenia and patient positioning. IMPRESSION: Limited study due to patient positioning with no focal infiltrate. Recommend a repeat PA and lateral chest x-ray with better positioning once the patient's symptoms have resolved. Electronically Signed   By: Gerome Sam III M.D   On: 12/27/2015 12:25    EKG: Independently reviewed.   Assessment/Plan Principal Problem:   Symptoms of upper respiratory infection (URI) Active Problems:   UTI (urinary tract infection), bacterial   SIRS (systemic inflammatory response syndrome) (HCC)   AKI (acute kidney injury) (HCC)   Acute encephalopathy   Parkinson disease (HCC)   Loose stools   25. 82 year old who has frequent urinary tract infections and was recently treated outpatient with antibiotics for urinary tract infection, presents with acute encephalopathy, recurrent UTI, and upper respiratory symptomatology. She is also had some loose stools. She is afebrile, but she does have a leukocytosis. Her creatinine is elevated, indicative of acute kidney injury from prerenal azotemia in the setting of loose stools and infection. Her blood pressure is on the low side of normal, but she is not hypotensive. Her lethargy is likely from metabolic encephalopathy or due to acute infection(s). No obvious facial droop or cranial nerve deficits seen on brief exam. 2. Patient was given Rocephin in the ED. This will be continued for treatment of the UTI. Urine culture was ordered in the ED. 3. We'll start IV fluid hydration for prerenal azotemia. We'll follow her renal function. 4. We'll order blood cultures if she becomes febrile. 5. Influenza panel ordered; results pending. Due to her loose stools and being on antibiotics recently, will order C. difficile PCR to rule out colitis. 6. We'll hold her oral medications until tomorrow morning or  until she is more alert. Protonix will be given IV every other day. Small dosing of morphine will be ordered as needed for pain.    Code Status: DO NOT RESUSCITATE as discussed with her daughter and POA [name]  DVT Prophylaxis: Lovenox  Family Communication: discussed with daughters and caretaker  Disposition Plan: anticipate discharge to home in the next 2-3 days.   Time spent: one hour  [facility] Pager [phone]

## 2015-12-27 NOTE — ED Notes (Signed)
Report given to Alona BeneJoyce, RN for room 318.

## 2015-12-27 NOTE — ED Notes (Signed)
EMS reports pt has had generalized weakness x 3 weeks, also chest congestion, and intermittent fever.  Pt also c/o left shoulder and left hip pain.  Reports history of arthritis and parkinsons disease.  EMS unable to obtain 02 sat reading, reports fingers are cold.

## 2015-12-28 ENCOUNTER — Inpatient Hospital Stay (HOSPITAL_COMMUNITY): Payer: PPO

## 2015-12-28 DIAGNOSIS — S37009A Unspecified injury of unspecified kidney, initial encounter: Secondary | ICD-10-CM | POA: Diagnosis not present

## 2015-12-28 DIAGNOSIS — N390 Urinary tract infection, site not specified: Secondary | ICD-10-CM | POA: Diagnosis not present

## 2015-12-28 DIAGNOSIS — J069 Acute upper respiratory infection, unspecified: Secondary | ICD-10-CM | POA: Diagnosis not present

## 2015-12-28 DIAGNOSIS — J9811 Atelectasis: Secondary | ICD-10-CM | POA: Diagnosis not present

## 2015-12-28 LAB — CBC
HEMATOCRIT: 27.7 % — AB (ref 36.0–46.0)
HEMOGLOBIN: 9.5 g/dL — AB (ref 12.0–15.0)
MCH: 34.1 pg — ABNORMAL HIGH (ref 26.0–34.0)
MCHC: 34.3 g/dL (ref 30.0–36.0)
MCV: 99.3 fL (ref 78.0–100.0)
Platelets: 268 10*3/uL (ref 150–400)
RBC: 2.79 MIL/uL — ABNORMAL LOW (ref 3.87–5.11)
RDW: 13.9 % (ref 11.5–15.5)
WBC: 12 10*3/uL — ABNORMAL HIGH (ref 4.0–10.5)

## 2015-12-28 LAB — BASIC METABOLIC PANEL
ANION GAP: 6 (ref 5–15)
BUN: 26 mg/dL — AB (ref 6–20)
CHLORIDE: 107 mmol/L (ref 101–111)
CO2: 21 mmol/L — ABNORMAL LOW (ref 22–32)
Calcium: 8.5 mg/dL — ABNORMAL LOW (ref 8.9–10.3)
Creatinine, Ser: 1.71 mg/dL — ABNORMAL HIGH (ref 0.44–1.00)
GFR calc non Af Amer: 25 mL/min — ABNORMAL LOW (ref >60)
GFR, EST AFRICAN AMERICAN: 29 mL/min — AB (ref >60)
GLUCOSE: 88 mg/dL (ref 65–99)
POTASSIUM: 5.4 mmol/L — AB (ref 3.5–5.1)
Sodium: 134 mmol/L — ABNORMAL LOW (ref 135–145)

## 2015-12-28 MED ORDER — SODIUM CHLORIDE 0.9 % IV SOLN
INTRAVENOUS | Status: DC
  Administered 2015-12-28 – 2016-01-02 (×6): via INTRAVENOUS

## 2015-12-28 MED ORDER — INFLUENZA VAC SPLIT QUAD 0.5 ML IM SUSY
0.5000 mL | PREFILLED_SYRINGE | INTRAMUSCULAR | Status: AC
  Administered 2015-12-31: 0.5 mL via INTRAMUSCULAR
  Filled 2015-12-28: qty 0.5

## 2015-12-28 MED ORDER — ENOXAPARIN SODIUM 30 MG/0.3ML ~~LOC~~ SOLN
30.0000 mg | SUBCUTANEOUS | Status: DC
  Administered 2015-12-28 – 2016-01-01 (×5): 30 mg via SUBCUTANEOUS
  Filled 2015-12-28 (×5): qty 0.3

## 2015-12-28 NOTE — Progress Notes (Signed)
Subjective: [name] opens her eyes to verbal stimulation but is otherwise unresponsive. No fever.  Objective: Vital signs in last 24 hours: Filed Vitals:   12/27/15 1501 12/27/15 1542 12/27/15 2100 12/28/15 0635  BP: 142/80  130/66 108/61  Pulse: 91  86 84  Temp: 98 F (36.7 C)  98.1 F (36.7 C) 98.2 F (36.8 C)  TempSrc: Oral  Oral Oral  Resp: Height:   (1.422 m)    Weight:  111 lb 8.8 oz (50.6 kg)    SpO2: 94%  93% 94%   Weight change:   Intake/Output Summary (Last 24 hours) at 12/28/15 0953 Last data filed at 12/28/15 0803  Gross per 24 hour  Intake 163.33 ml  Output      0 ml  Net 163.33 ml    Physical Exam: HEENT: No scleral icterus. Pharynx moist. No cervical lymphadenopathy. Lungs reveal mild rhonchi. Heart regular with no murmurs. Abdomen is soft and nontender with no palpable organomegaly. Extremities reveal no edema. Skin is warm and dry with no rash.  Lab Results:    Results for orders placed or performed during the hospital encounter of 12/27/15 (from the past 24 hour(s))  CBC with Differential     Status: Abnormal   Collection Time: 12/27/15 11:15 AM  Result Value Ref Range   WBC 17.3 (H) 4.0 - 10.5 K/uL   RBC 3.49 (L) 3.87 - 5.11 MIL/uL   Hemoglobin 11.7 (L) 12.0 - 15.0 g/dL   HCT 09.8 (L) 11.9 - 14.7 %   MCV 100.0 78.0 - 100.0 fL   MCH 33.5 26.0 - 34.0 pg   MCHC 33.5 30.0 - 36.0 g/dL   RDW 82.9 56.2 - 13.0 %   Platelets 266 150 - 400 K/uL   Neutrophils Relative % 93 %   Neutro Abs 16.1 (H) 1.7 - 7.7 K/uL   Lymphocytes Relative 3 %   Lymphs Abs 0.5 (L) 0.7 - 4.0 K/uL   Monocytes Relative 4 %   Monocytes Absolute 0.6 0.1 - 1.0 K/uL   Eosinophils Relative 0 %   Eosinophils Absolute 0.0 0.0 - 0.7 K/uL   Basophils Relative 0 %   Basophils Absolute 0.0 0.0 - 0.1 K/uL  Basic metabolic panel     Status: Abnormal   Collection Time: 12/27/15 11:15 AM  Result Value Ref Range   Sodium 134 (L) 135 - 145 mmol/L   Potassium 4.9 3.5 - 5.1  mmol/L   Chloride 104 101 - 111 mmol/L   CO2 20 (L) 22 - 32 mmol/L   Glucose, Bld 98 65 - 99 mg/dL   BUN 23 (H) 6 - 20 mg/dL   Creatinine, Ser 8.65 (H) 0.44 - 1.00 mg/dL   Calcium 8.9 8.9 - 78.4 mg/dL   GFR calc non Af Amer 26 (L) >60 mL/min   GFR calc Af Amer 30 (L) >60 mL/min   Anion gap 10 5 - 15  Troponin I     Status: None   Collection Time: 12/27/15 11:15 AM  Result Value Ref Range   Troponin I <0.03 <0.031 ng/mL  Hepatic function panel     Status: Abnormal   Collection Time: 12/27/15 11:15 AM  Result Value Ref Range   Total Protein 7.4 6.5 - 8.1 g/dL   Albumin 3.5 3.5 - 5.0 g/dL   AST 18 15 - 41 U/L   ALT 5 (L) 14 - 54 U/L   Alkaline Phosphatase 131 (H) 38 - 126  U/L   Total Bilirubin 0.4 0.3 - 1.2 mg/dL   Bilirubin, Direct 0.1 0.1 - 0.5 mg/dL   Indirect Bilirubin 0.3 0.3 - 0.9 mg/dL  Urinalysis, Routine w reflex microscopic ([facility])     Status: Abnormal   Collection Time: 12/27/15 11:43 AM  Result Value Ref Range   Color, Urine YELLOW YELLOW   APPearance CLOUDY (A) CLEAR   Specific Gravity, Urine 1.010 1.005 - 1.030   pH 6.5 5.0 - 8.0   Glucose, UA NEGATIVE NEGATIVE mg/dL   Hgb urine dipstick LARGE (A) NEGATIVE   Bilirubin Urine NEGATIVE NEGATIVE   Ketones, ur NEGATIVE NEGATIVE mg/dL   Protein, ur 30 (A) NEGATIVE mg/dL   Nitrite NEGATIVE NEGATIVE   Leukocytes, UA LARGE (A) NEGATIVE  Urine microscopic-add on     Status: Abnormal   Collection Time: 12/27/15 11:43 AM  Result Value Ref Range   Squamous Epithelial / LPF 0-5 (A) NONE SEEN   WBC, UA TOO NUMEROUS TO COUNT 0 - 5 WBC/hpf   RBC / HPF TOO NUMEROUS TO COUNT 0 - 5 RBC/hpf   Bacteria, UA FEW (A) NONE SEEN  Influenza panel by PCR (type A & B, H1N1)     Status: None   Collection Time: 12/27/15  1:31 PM  Result Value Ref Range   Influenza A By PCR NEGATIVE NEGATIVE   Influenza B By PCR NEGATIVE NEGATIVE   H1N1 flu by pcr NOT DETECTED NOT DETECTED  Lactic acid, plasma     Status: None   Collection Time:  12/27/15  1:59 PM  Result Value Ref Range   Lactic Acid, Venous 1.3 0.5 - 2.0 mmol/L  Basic metabolic panel     Status: Abnormal   Collection Time: 12/28/15  5:50 AM  Result Value Ref Range   Sodium 134 (L) 135 - 145 mmol/L   Potassium 5.4 (H) 3.5 - 5.1 mmol/L   Chloride 107 101 - 111 mmol/L   CO2 21 (L) 22 - 32 mmol/L   Glucose, Bld 88 65 - 99 mg/dL   BUN 26 (H) 6 - 20 mg/dL   Creatinine, Ser 1.911.71 (H) 0.44 - 1.00 mg/dL   Calcium 8.5 (L) 8.9 - 10.3 mg/dL   GFR calc non Af Amer 25 (L) >60 mL/min   GFR calc Af Amer 29 (L) >60 mL/min   Anion gap 6 5 - 15  CBC     Status: Abnormal   Collection Time: 12/28/15  5:50 AM  Result Value Ref Range   WBC 12.0 (H) 4.0 - 10.5 K/uL   RBC 2.79 (L) 3.87 - 5.11 MIL/uL   Hemoglobin 9.5 (L) 12.0 - 15.0 g/dL   HCT 47.827.7 (L) 29.536.0 - 62.146.0 %   MCV 99.3 78.0 - 100.0 fL   MCH 34.1 (H) 26.0 - 34.0 pg   MCHC 34.3 30.0 - 36.0 g/dL   RDW 30.813.9 65.711.5 - 84.615.5 %   Platelets 268 150 - 400 K/uL     ABGS No results for input(s): PHART, PO2ART, TCO2, HCO3 in the last 72 hours.  Invalid input(s): PCO2 CULTURES No results found for this or any previous visit (from the past 240 hour(s)). Studies/Results: Dg Chest 2 View  12/27/2015  CLINICAL DATA:  Generalized weakness and chest congestion. EXAM: CHEST  2 VIEW COMPARISON:  April 29, 2015 FINDINGS: The study is limited due to patient rotation. No pneumothorax. The cardiomediastinal silhouette is stable. A rounded density in the region of the right hilum is probably a vessel  on end. This finding is stable. Increased interstitial markings on the right versus the left, probably due to patient rotation. No focal infiltrates identified. No masses. The thoracic spine is not well assessed due to osteopenia and patient positioning. IMPRESSION: Limited study due to patient positioning with no focal infiltrate. Recommend a repeat PA and lateral chest x-ray with better positioning once the patient's symptoms have resolved.  Electronically Signed   By: Gerome Sam III M.D   On: 12/27/2015 12:25   Micro Results: No results found for this or any previous visit (from the past 240 hour(s)). Studies/Results: Dg Chest 2 View  12/27/2015  CLINICAL DATA:  Generalized weakness and chest congestion. EXAM: CHEST  2 VIEW COMPARISON:  April 29, 2015 FINDINGS: The study is limited due to patient rotation. No pneumothorax. The cardiomediastinal silhouette is stable. A rounded density in the region of the right hilum is probably a vessel on end. This finding is stable. Increased interstitial markings on the right versus the left, probably due to patient rotation. No focal infiltrates identified. No masses. The thoracic spine is not well assessed due to osteopenia and patient positioning. IMPRESSION: Limited study due to patient positioning with no focal infiltrate. Recommend a repeat PA and lateral chest x-ray with better positioning once the patient's symptoms have resolved. Electronically Signed   By: Gerome Sam III M.D   On: 12/27/2015 12:25   Medications:  I have reviewed the patient's current medications Scheduled Meds: . baclofen  5 mg Oral BID  . carbidopa-levodopa  1 tablet Oral TID  . cefTRIAXone (ROCEPHIN)  IV  1 g Intravenous Q24H  . enoxaparin (LOVENOX) injection  40 mg Subcutaneous Q24H  . Influenza vac split quadrivalent PF  0.5 mL Intramuscular Tomorrow-1000  . pantoprazole (PROTONIX) IV  40 mg Intravenous QODAY  . PARoxetine  10 mg Oral q morning - 10a   Continuous Infusions: . sodium chloride     PRN Meds:.acetaminophen **OR** acetaminophen, albuterol, morphine CONCENTRATE, ondansetron **OR** ondansetron (ZOFRAN) IV   Assessment/Plan: #1. URI. Chest x-ray reveals no infiltrate. Leukocytosis improved. White count down from 17-12. She appears quite weak and is clearly not at her usual level of alertness. Prognosis potentially quite poor. #2. UTI. History of chronic recurrent UTIs. Culture pending. Continue  ceftriaxone. #3. Acute kidney injury. BUN and creatinine elevated above baseline at 26 and 1.7. Continue IV hydration although IV access was lost last night. She is mildly hyperkalemic at 5.4. Modified IV fluids to saline without potassium supplementation. #4. Parkinson's disease. Prognosis poor. DO NOT RESUSCITATE in place. By mouth liquid morphine as needed. Principal Problem:   Symptoms of upper respiratory infection (URI) Active Problems:   Parkinson disease (HCC)   UTI (urinary tract infection), bacterial   SIRS (systemic inflammatory response syndrome) (HCC)   AKI (acute kidney injury) (HCC)   Loose stools   Acute encephalopathy     LOS: 1 day   Kennedy Brines 12/28/2015, 9:53 AM

## 2015-12-29 DIAGNOSIS — S37009A Unspecified injury of unspecified kidney, initial encounter: Secondary | ICD-10-CM | POA: Diagnosis not present

## 2015-12-29 DIAGNOSIS — N390 Urinary tract infection, site not specified: Secondary | ICD-10-CM | POA: Diagnosis not present

## 2015-12-29 DIAGNOSIS — J069 Acute upper respiratory infection, unspecified: Secondary | ICD-10-CM | POA: Diagnosis not present

## 2015-12-29 LAB — BASIC METABOLIC PANEL
ANION GAP: 11 (ref 5–15)
BUN: 23 mg/dL — AB (ref 6–20)
CO2: 19 mmol/L — ABNORMAL LOW (ref 22–32)
Calcium: 8.8 mg/dL — ABNORMAL LOW (ref 8.9–10.3)
Chloride: 109 mmol/L (ref 101–111)
Creatinine, Ser: 1.39 mg/dL — ABNORMAL HIGH (ref 0.44–1.00)
GFR, EST AFRICAN AMERICAN: 37 mL/min — AB (ref >60)
GFR, EST NON AFRICAN AMERICAN: 32 mL/min — AB (ref >60)
Glucose, Bld: 79 mg/dL (ref 65–99)
POTASSIUM: 5.2 mmol/L — AB (ref 3.5–5.1)
SODIUM: 139 mmol/L (ref 135–145)

## 2015-12-29 LAB — CBC WITH DIFFERENTIAL/PLATELET
BASOS ABS: 0 10*3/uL (ref 0.0–0.1)
Basophils Relative: 1 %
EOS PCT: 2 %
Eosinophils Absolute: 0.1 10*3/uL (ref 0.0–0.7)
HCT: 31 % — ABNORMAL LOW (ref 36.0–46.0)
Hemoglobin: 10.4 g/dL — ABNORMAL LOW (ref 12.0–15.0)
LYMPHS PCT: 12 %
Lymphs Abs: 1 10*3/uL (ref 0.7–4.0)
MCH: 34.2 pg — ABNORMAL HIGH (ref 26.0–34.0)
MCHC: 33.5 g/dL (ref 30.0–36.0)
MCV: 102 fL — AB (ref 78.0–100.0)
Monocytes Absolute: 0.7 10*3/uL (ref 0.1–1.0)
Monocytes Relative: 8 %
NEUTROS ABS: 6.4 10*3/uL (ref 1.7–7.7)
Neutrophils Relative %: 77 %
PLATELETS: 278 10*3/uL (ref 150–400)
RBC: 3.04 MIL/uL — AB (ref 3.87–5.11)
RDW: 14 % (ref 11.5–15.5)
WBC: 8.1 10*3/uL (ref 4.0–10.5)

## 2015-12-29 MED ORDER — BOOST / RESOURCE BREEZE PO LIQD
1.0000 | Freq: Three times a day (TID) | ORAL | Status: DC
  Administered 2015-12-29 – 2016-01-01 (×6): 1 via ORAL

## 2015-12-29 NOTE — Progress Notes (Addendum)
Initial Nutrition Assessment  INTERVENTION:  While diet is being advanced: Boost Breeze po TID, each supplement provides 250 kcal and 9 grams of protein   Encourage meal intake and assist as needed with feeding   NUTRITION DIAGNOSIS:   Inadequate oral intake related to acute illness as evidenced by meal completion < 25%.   GOAL:   Patient will meet greater than or equal to 90% of their needs   MONITOR:   PO intake, Supplement acceptance, Labs, Weight trends, Skin  REASON FOR ASSESSMENT:   Low Braden    ASSESSMENT: Patient has hx of recurrent UTIs, Parkinson's disease, DJD, depression, who presents to the ED with a  weakness, cough, lethargy, chest congestion, and rhinorrhea. Approximately 2 weeks ago, she developed a fever. A urinalysis was ordered and it was consistent with infection.    Patients meal intake reported as poor (<25%). Pt is unable to provide usual diet hx.  Nutrition focused exam:  Mild to moderate loss of fat and muscle mass upper body. No edema noted. Lower extremity contractures. Pt usually ambulates with wheelchair. Weight loss per hx 12% in 8 months. Abnormal labs: potassium elevated 5.2, BUN-23, Cr 1.39 which have trended down compared to yesterday. Anion gap and lactic acid-WNL.  Diet Order:  Diet clear liquid Room service appropriate?: Yes; Fluid consistency:: Thin  Skin:   intact, dry  Last BM:   3/3  Height:   Ht Readings from Last 1 Encounters:  12/27/15 4\' 8"  (1.422 m)    Weight:   Wt Readings from Last 1 Encounters:  12/27/15 111 lb 8.8 oz (50.6 kg)    Ideal Body Weight:  40.9 kg  BMI:  Body mass index is 25.02 kg/(m^2).  Estimated Nutritional Needs:   Kcal:  1530 (to prevent further weight loss)  Protein:  61-70 gr  Fluid:  1.5 liters daily  EDUCATION NEEDS: none at this time  Royann ShiversLynn Neely Cecena MS,RD,CSG,LDN Office: #[phone] Pager: [phone]

## 2015-12-29 NOTE — Progress Notes (Signed)
Subjective: Mrs. Rudell is awake. Eyes are open. She appears comfortable. No fever. Speaks in a week voice.  Objective: Vital signs in last 24 hours: Filed Vitals:   12/28/15 0635 12/28/15 1526 12/28/15 2230 12/29/15 0633  BP: 108/61 150/82 145/68 111/68  Pulse: 84 82 87 88  Temp: 98.2 F (36.8 C) 98.7 F (37.1 C) 98.4 F (36.9 C) 98.2 F (36.8 C)  TempSrc: Oral Oral Oral Oral  Resp: Height:      Weight:      SpO2: 94% 94% 95% 95%   Weight change:   Intake/Output Summary (Last 24 hours) at 12/29/15 0732 Last data filed at 12/28/15 1300  Gross per 24 hour  Intake      0 ml  Output      0 ml  Net      0 ml    Physical Exam: No distress. Lungs reveal bilateral rhonchi. Heart regular with no murmurs. Abdomen soft and nontender. Extremities reveal no edema. IV in place.  Chest x-ray repeat reveals no acute infiltrate.  Lab Results:   No results found for this or any previous visit (from the past 24 hour(s)).   ABGS No results for input(s): PHART, PO2ART, TCO2, HCO3 in the last 72 hours.  Invalid input(s): PCO2 CULTURES No results found for this or any previous visit (from the past 240 hour(s)). Studies/Results: Dg Chest 1 View  12/28/2015  CLINICAL DATA:  Sepsis, history hypertension, Parkinson's, prior pulmonary embolism EXAM: CHEST 1 VIEW COMPARISON:  Portable exam 1034 hours compared to 12/27/2015 FINDINGS: Enlargement of cardiac silhouette. Tortuous aorta. Kyphotic positioning with rotation to the RIGHT. Minimal bibasilar atelectasis. No gross infiltrate, pleural effusion, or pneumothorax. Diffuse osseous demineralization. IMPRESSION: Minimal bibasilar atelectasis. Electronically Signed   By: Ulyses Southward M.D.   On: 12/28/2015 10:46   Dg Chest 2 View  12/27/2015  CLINICAL DATA:  Generalized weakness and chest congestion. EXAM: CHEST  2 VIEW COMPARISON:  April 29, 2015 FINDINGS: The study is limited due to patient rotation. No pneumothorax. The cardiomediastinal  silhouette is stable. A rounded density in the region of the right hilum is probably a vessel on end. This finding is stable. Increased interstitial markings on the right versus the left, probably due to patient rotation. No focal infiltrates identified. No masses. The thoracic spine is not well assessed due to osteopenia and patient positioning. IMPRESSION: Limited study due to patient positioning with no focal infiltrate. Recommend a repeat PA and lateral chest x-ray with better positioning once the patient's symptoms have resolved. Electronically Signed   By: Gerome Sam III M.D   On: 12/27/2015 12:25   Micro Results: No results found for this or any previous visit (from the past 240 hour(s)). Studies/Results: Dg Chest 1 View  12/28/2015  CLINICAL DATA:  Sepsis, history hypertension, Parkinson's, prior pulmonary embolism EXAM: CHEST 1 VIEW COMPARISON:  Portable exam 1034 hours compared to 12/27/2015 FINDINGS: Enlargement of cardiac silhouette. Tortuous aorta. Kyphotic positioning with rotation to the RIGHT. Minimal bibasilar atelectasis. No gross infiltrate, pleural effusion, or pneumothorax. Diffuse osseous demineralization. IMPRESSION: Minimal bibasilar atelectasis. Electronically Signed   By: Ulyses Southward M.D.   On: 12/28/2015 10:46   Dg Chest 2 View  12/27/2015  CLINICAL DATA:  Generalized weakness and chest congestion. EXAM: CHEST  2 VIEW COMPARISON:  April 29, 2015 FINDINGS: The study is limited due to patient rotation. No pneumothorax. The cardiomediastinal silhouette is stable. A rounded density in the region of the  right hilum is probably a vessel on end. This finding is stable. Increased interstitial markings on the right versus the left, probably due to patient rotation. No focal infiltrates identified. No masses. The thoracic spine is not well assessed due to osteopenia and patient positioning. IMPRESSION: Limited study due to patient positioning with no focal infiltrate. Recommend a repeat PA  and lateral chest x-ray with better positioning once the patient's symptoms have resolved. Electronically Signed   By: Gerome Samavid  Williams III M.D   On: 12/27/2015 12:25   Medications:  I have reviewed the patient's current medications Scheduled Meds: . baclofen  5 mg Oral BID  . carbidopa-levodopa  1 tablet Oral TID  . cefTRIAXone (ROCEPHIN)  IV  1 g Intravenous Q24H  . enoxaparin (LOVENOX) injection  30 mg Subcutaneous Q24H  . Influenza vac split quadrivalent PF  0.5 mL Intramuscular Tomorrow-1000  . pantoprazole (PROTONIX) IV  40 mg Intravenous QODAY  . PARoxetine  10 mg Oral q morning - 10a   Continuous Infusions: . sodium chloride 25 mL/hr at 12/28/15 1247   PRN Meds:.acetaminophen **OR** acetaminophen, albuterol, morphine CONCENTRATE, ondansetron **OR** ondansetron (ZOFRAN) IV   Assessment/Plan: #1. URI. Oxygen saturation 95% on room air. #2. UTI. Culture pending. Continue ceftriaxone. #3. Acute kidney injury. Repeat metabolic profile pending. Continue IV fluids. Principal Problem:   Symptoms of upper respiratory infection (URI) Active Problems:   Parkinson disease (HCC)   UTI (urinary tract infection), bacterial   SIRS (systemic inflammatory response syndrome) (HCC)   AKI (acute kidney injury) (HCC)   Loose stools   Acute encephalopathy     LOS: 2 days   Priyansh Pry 12/29/2015, 7:32 AM

## 2015-12-30 DIAGNOSIS — I2699 Other pulmonary embolism without acute cor pulmonale: Secondary | ICD-10-CM | POA: Diagnosis not present

## 2015-12-30 DIAGNOSIS — G311 Senile degeneration of brain, not elsewhere classified: Secondary | ICD-10-CM | POA: Diagnosis not present

## 2015-12-30 DIAGNOSIS — G20 Parkinson's disease: Secondary | ICD-10-CM | POA: Diagnosis not present

## 2015-12-30 DIAGNOSIS — F0390 Unspecified dementia without behavioral disturbance: Secondary | ICD-10-CM | POA: Diagnosis not present

## 2015-12-30 LAB — URINE CULTURE

## 2015-12-30 NOTE — Progress Notes (Signed)
Subjective: She is awake and alert this morning. Voice is stronger. She appears stronger. She is able to eat some Jell-O.  Objective: Vital signs in last 24 hours: Filed Vitals:   12/29/15 1236 12/29/15 1527 12/29/15 2109 12/30/15 0657  BP:  175/82 164/81 174/78  Pulse:  94 88 84  Temp:  98.7 F (37.1 C) 97.5 F (36.4 C) 97.8 F (36.6 C)  TempSrc:  Axillary Oral Oral  Resp:  20 18 19   Height:      Weight:      SpO2: 96% 97% 95% 94%   Weight change:  No intake or output data in the 24 hours ending 12/30/15 0741  Physical Exam: Neuro at baseline now. Lungs clear. Heart regular with occasional ectopy. Abdomen soft and nontender.  Lab Results:   No results found for this or any previous visit (from the past 24 hour(s)).   ABGS No results for input(s): PHART, PO2ART, TCO2, HCO3 in the last 72 hours.  Invalid input(s): PCO2 CULTURES Recent Results (from the past 240 hour(s))  Urine culture     Status: None (Preliminary result)   Collection Time: 12/27/15  1:05 PM  Result Value Ref Range Status   Specimen Description URINE, CATHETERIZED  Final   Special Requests NONE  Final   Culture   Final    >=100,000 COLONIES/mL ESCHERICHIA COLI Performed at [facility]    Report Status PENDING  Incomplete   Studies/Results: Dg Chest 1 View  12/28/2015  CLINICAL DATA:  Sepsis, history hypertension, Parkinson's, prior pulmonary embolism EXAM: CHEST 1 VIEW COMPARISON:  Portable exam 1034 hours compared to 12/27/2015 FINDINGS: Enlargement of cardiac silhouette. Tortuous aorta. Kyphotic positioning with rotation to the RIGHT. Minimal bibasilar atelectasis. No gross infiltrate, pleural effusion, or pneumothorax. Diffuse osseous demineralization. IMPRESSION: Minimal bibasilar atelectasis. Electronically Signed   By: Ulyses SouthwardMark  Boles M.D.   On: 12/28/2015 10:46   Micro Results: Recent Results (from the past 240 hour(s))  Urine culture     Status: None (Preliminary result)   Collection  Time: 12/27/15  1:05 PM  Result Value Ref Range Status   Specimen Description URINE, CATHETERIZED  Final   Special Requests NONE  Final   Culture   Final    >=100,000 COLONIES/mL ESCHERICHIA COLI [facility]    Report Status PENDING  Incomplete   Studies/Results: Dg Chest 1 View  12/28/2015  CLINICAL DATA:  Sepsis, history hypertension, Parkinson's, prior pulmonary embolism EXAM: CHEST 1 VIEW COMPARISON:  Portable exam 1034 hours compared to 12/27/2015 FINDINGS: Enlargement of cardiac silhouette. Tortuous aorta. Kyphotic positioning with rotation to the RIGHT. Minimal bibasilar atelectasis. No gross infiltrate, pleural effusion, or pneumothorax. Diffuse osseous demineralization. IMPRESSION: Minimal bibasilar atelectasis. Electronically Signed   By: Ulyses SouthwardMark  Boles M.D.   On: 12/28/2015 10:46   Medications:  I have reviewed the patient's current medications Scheduled Meds: . baclofen  5 mg Oral BID  . carbidopa-levodopa  1 tablet Oral TID  . cefTRIAXone (ROCEPHIN)  IV  1 g Intravenous Q24H  . enoxaparin (LOVENOX) injection  30 mg Subcutaneous Q24H  . feeding supplement  1 Container Oral TID BM  . Influenza vac split quadrivalent PF  0.5 mL Intramuscular Tomorrow-1000  . pantoprazole (PROTONIX) IV  40 mg Intravenous QODAY  . PARoxetine  10 mg Oral q morning - 10a   Continuous Infusions: . sodium chloride 50 mL/hr at 12/29/15 1430   PRN Meds:.acetaminophen **OR** acetaminophen, albuterol, morphine CONCENTRATE, ondansetron **OR** ondansetron (ZOFRAN) IV  Assessment/Plan: #1. UTI. Culture reveals Escherichia coli. Sensitivities remain pending. White count is down to normal. Continue ceftriaxone. #2. URI. Symptoms much improved. #3. Acute kidney injury. BUN and creatinine are improved. Hyperkalemia improved to 5.2. Continue IV fluids. #4. Parkinson's disease. Hope to see improved nutritional intake. Principal Problem:   Symptoms of upper respiratory infection  (URI) Active Problems:   Parkinson disease (HCC)   UTI (urinary tract infection), bacterial   SIRS (systemic inflammatory response syndrome) (HCC)   AKI (acute kidney injury) (HCC)   Loose stools   Acute encephalopathy     LOS: 3 days   Jeannine Pennisi 12/30/2015, 7:41 AM

## 2015-12-31 DIAGNOSIS — G9341 Metabolic encephalopathy: Secondary | ICD-10-CM | POA: Diagnosis not present

## 2015-12-31 DIAGNOSIS — Z23 Encounter for immunization: Secondary | ICD-10-CM | POA: Diagnosis not present

## 2015-12-31 DIAGNOSIS — N179 Acute kidney failure, unspecified: Secondary | ICD-10-CM | POA: Diagnosis not present

## 2015-12-31 DIAGNOSIS — Z87440 Personal history of urinary (tract) infections: Secondary | ICD-10-CM | POA: Diagnosis not present

## 2015-12-31 DIAGNOSIS — Z993 Dependence on wheelchair: Secondary | ICD-10-CM | POA: Diagnosis not present

## 2015-12-31 DIAGNOSIS — K219 Gastro-esophageal reflux disease without esophagitis: Secondary | ICD-10-CM | POA: Diagnosis not present

## 2015-12-31 DIAGNOSIS — Z86711 Personal history of pulmonary embolism: Secondary | ICD-10-CM | POA: Diagnosis not present

## 2015-12-31 DIAGNOSIS — G311 Senile degeneration of brain, not elsewhere classified: Secondary | ICD-10-CM | POA: Diagnosis not present

## 2015-12-31 DIAGNOSIS — B9620 Unspecified Escherichia coli [E. coli] as the cause of diseases classified elsewhere: Secondary | ICD-10-CM | POA: Diagnosis not present

## 2015-12-31 DIAGNOSIS — R1310 Dysphagia, unspecified: Secondary | ICD-10-CM | POA: Diagnosis not present

## 2015-12-31 DIAGNOSIS — F0390 Unspecified dementia without behavioral disturbance: Secondary | ICD-10-CM | POA: Diagnosis not present

## 2015-12-31 DIAGNOSIS — Z515 Encounter for palliative care: Secondary | ICD-10-CM | POA: Diagnosis not present

## 2015-12-31 DIAGNOSIS — Z66 Do not resuscitate: Secondary | ICD-10-CM | POA: Diagnosis not present

## 2015-12-31 DIAGNOSIS — E119 Type 2 diabetes mellitus without complications: Secondary | ICD-10-CM | POA: Diagnosis not present

## 2015-12-31 DIAGNOSIS — R531 Weakness: Secondary | ICD-10-CM | POA: Diagnosis not present

## 2015-12-31 DIAGNOSIS — J069 Acute upper respiratory infection, unspecified: Secondary | ICD-10-CM | POA: Diagnosis not present

## 2015-12-31 DIAGNOSIS — Z833 Family history of diabetes mellitus: Secondary | ICD-10-CM | POA: Diagnosis not present

## 2015-12-31 DIAGNOSIS — N390 Urinary tract infection, site not specified: Secondary | ICD-10-CM | POA: Diagnosis not present

## 2015-12-31 DIAGNOSIS — I10 Essential (primary) hypertension: Secondary | ICD-10-CM | POA: Diagnosis not present

## 2015-12-31 DIAGNOSIS — I2699 Other pulmonary embolism without acute cor pulmonale: Secondary | ICD-10-CM | POA: Diagnosis not present

## 2015-12-31 DIAGNOSIS — G20 Parkinson's disease: Secondary | ICD-10-CM | POA: Diagnosis not present

## 2015-12-31 DIAGNOSIS — R633 Feeding difficulties: Secondary | ICD-10-CM | POA: Diagnosis not present

## 2015-12-31 DIAGNOSIS — E875 Hyperkalemia: Secondary | ICD-10-CM | POA: Diagnosis not present

## 2015-12-31 LAB — BASIC METABOLIC PANEL
ANION GAP: 6 (ref 5–15)
BUN: 9 mg/dL (ref 6–20)
CO2: 25 mmol/L (ref 22–32)
Calcium: 8.3 mg/dL — ABNORMAL LOW (ref 8.9–10.3)
Chloride: 107 mmol/L (ref 101–111)
Creatinine, Ser: 0.78 mg/dL (ref 0.44–1.00)
GFR calc non Af Amer: >60 mL/min (ref >60)
GLUCOSE: 105 mg/dL — AB (ref 65–99)
Potassium: 3.8 mmol/L (ref 3.5–5.1)
Sodium: 138 mmol/L (ref 135–145)

## 2015-12-31 MED ORDER — PANTOPRAZOLE SODIUM 40 MG PO TBEC: 40.0000 mg | DELAYED_RELEASE_TABLET | ORAL | Status: DC

## 2015-12-31 MED ORDER — PANTOPRAZOLE SODIUM 40 MG IV SOLR
40.0000 mg | INTRAVENOUS | Status: DC
  Administered 2016-01-02: 40 mg via INTRAVENOUS
  Filled 2015-12-31: qty 40

## 2015-12-31 NOTE — Progress Notes (Signed)
Subjective: [name] is poorly responsive this morning. Her family is unable to awaken her. She has had significant difficulty swallowing. She did eat a little more yesterday.  Objective: Vital signs in last 24 hours: Filed Vitals:   12/30/15 1300 12/30/15 1428 12/30/15 2111 12/31/15 0700  BP:  153/69 175/77 159/98  Pulse:  87 72 88  Temp:  97.8 F (36.6 C) 97.6 F (36.4 C) 97.5 F (36.4 C)  TempSrc:  Oral Axillary Oral  Resp:  Height:      Weight:      SpO2: 95% 96% 97% 94%   Weight change:   Intake/Output Summary (Last 24 hours) at 12/31/15 0741 Last data filed at 12/30/15 1829  Gross per 24 hour  Intake   1290 ml  Output      0 ml  Net   1290 ml    Physical Exam: Minimally responsive. Briefly opens eyes but does not speak. Lungs clear. Heart regular with ectopy. Abdomen soft and nontender.  Lab Results:    Results for orders placed or performed during the hospital encounter of 12/27/15 (from the past 24 hour(s))  Basic metabolic panel     Status: Abnormal   Collection Time: 12/31/15  6:03 AM  Result Value Ref Range   Sodium 138 135 - 145 mmol/L   Potassium 3.8 3.5 - 5.1 mmol/L   Chloride 107 101 - 111 mmol/L   CO2 25 22 - 32 mmol/L   Glucose, Bld 105 (H) 65 - 99 mg/dL   BUN 9 6 - 20 mg/dL   Creatinine, Ser 9.60 0.44 - 1.00 mg/dL   Calcium 8.3 (L) 8.9 - 10.3 mg/dL   GFR calc non Af Amer >60 >60 mL/min   GFR calc Af Amer >60 >60 mL/min   Anion gap 6 5 - 15     ABGS No results for input(s): PHART, PO2ART, TCO2, HCO3 in the last 72 hours.  Invalid input(s): PCO2 CULTURES Recent Results (from the past 240 hour(s))  Urine culture     Status: None   Collection Time: 12/27/15  1:05 PM  Result Value Ref Range Status   Specimen Description URINE, CATHETERIZED  Final   Special Requests NONE  Final   Culture   Final    >=100,000 COLONIES/mL ESCHERICHIA COLI Performed at [facility]    Report Status 12/30/2015 FINAL  Final   Organism ID,  Bacteria ESCHERICHIA COLI  Final      Susceptibility   Escherichia coli - MIC*    AMPICILLIN >=32 RESISTANT Resistant     CEFAZOLIN <=4 SENSITIVE Sensitive     CEFTRIAXONE <=1 SENSITIVE Sensitive     CIPROFLOXACIN >=4 RESISTANT Resistant     GENTAMICIN 4 SENSITIVE Sensitive     IMIPENEM <=0.25 SENSITIVE Sensitive     NITROFURANTOIN <=16 SENSITIVE Sensitive     TRIMETH/SULFA >=320 RESISTANT Resistant     AMPICILLIN/SULBACTAM 8 SENSITIVE Sensitive     PIP/TAZO <=4 SENSITIVE Sensitive     * >=100,000 COLONIES/mL ESCHERICHIA COLI   Studies/Results: No results found. Micro Results: Recent Results (from the past 240 hour(s))  Urine culture     Status: None   Collection Time: 12/27/15  1:05 PM  Result Value Ref Range Status   Specimen Description URINE, CATHETERIZED  Final   Special Requests NONE  Final   Culture   Final    >=100,000 COLONIES/mL ESCHERICHIA COLI Performed at [facility]    Report Status 12/30/2015 FINAL  Final   Organism ID, Bacteria ESCHERICHIA COLI  Final      Susceptibility   Escherichia coli - MIC*    AMPICILLIN >=32 RESISTANT Resistant     CEFAZOLIN <=4 SENSITIVE Sensitive     CEFTRIAXONE <=1 SENSITIVE Sensitive     CIPROFLOXACIN >=4 RESISTANT Resistant     GENTAMICIN 4 SENSITIVE Sensitive     IMIPENEM <=0.25 SENSITIVE Sensitive     NITROFURANTOIN <=16 SENSITIVE Sensitive     TRIMETH/SULFA >=320 RESISTANT Resistant     AMPICILLIN/SULBACTAM 8 SENSITIVE Sensitive     PIP/TAZO <=4 SENSITIVE Sensitive     * >=100,000 COLONIES/mL ESCHERICHIA COLI   Studies/Results: No results found. Medications:  I have reviewed the patient's current medications Scheduled Meds: . baclofen  5 mg Oral BID  . carbidopa-levodopa  1 tablet Oral TID  . cefTRIAXone (ROCEPHIN)  IV  1 g Intravenous Q24H  . enoxaparin (LOVENOX) injection  30 mg Subcutaneous Q24H  . feeding supplement  1 Container Oral TID BM  . Influenza vac split quadrivalent PF  0.5 mL Intramuscular  Tomorrow-1000  . pantoprazole (PROTONIX) IV  40 mg Intravenous QODAY  . PARoxetine  10 mg Oral q morning - 10a   Continuous Infusions: . sodium chloride 50 mL/hr at 12/30/15 1231   PRN Meds:.acetaminophen **OR** acetaminophen, albuterol, morphine CONCENTRATE, ondansetron **OR** ondansetron (ZOFRAN) IV   Assessment/Plan: #1. Escherichia coli UTI. Sensitive to ceftriaxone. With her swallowing difficulty ceftriaxone will be continued for now. No fever. #2. Acute kidney injury. BUN and creatinine have returned to normal. Electrolytes are normal. Continue IV fluids. #3. Parkinson's disease with dysphagia. History of large hiatal hernia. Tube feeding not desired. Hopefully she will be able to wake up later today and be able to successfully swallow. Principal Problem:   Symptoms of upper respiratory infection (URI) Active Problems:   Parkinson disease (HCC)   UTI (urinary tract infection), bacterial   SIRS (systemic inflammatory response syndrome) (HCC)   AKI (acute kidney injury) (HCC)   Loose stools   Acute encephalopathy     LOS: 4 days   [name] 12/31/2015, 7:41 AM

## 2015-12-31 NOTE — Progress Notes (Signed)
Patient lethargic, unable to swallow liquids, foods, or medications. Liquid and pudding given to patient by family and patient began coughing. Dr. Ouida SillsFagan notified.

## 2016-01-01 ENCOUNTER — Encounter (HOSPITAL_COMMUNITY): Payer: Self-pay | Admitting: Primary Care

## 2016-01-01 DIAGNOSIS — R0689 Other abnormalities of breathing: Secondary | ICD-10-CM

## 2016-01-01 DIAGNOSIS — Z7189 Other specified counseling: Secondary | ICD-10-CM

## 2016-01-01 DIAGNOSIS — G20 Parkinson's disease: Secondary | ICD-10-CM

## 2016-01-01 DIAGNOSIS — R4182 Altered mental status, unspecified: Secondary | ICD-10-CM | POA: Diagnosis not present

## 2016-01-01 DIAGNOSIS — I2699 Other pulmonary embolism without acute cor pulmonale: Secondary | ICD-10-CM | POA: Diagnosis not present

## 2016-01-01 DIAGNOSIS — R0600 Dyspnea, unspecified: Secondary | ICD-10-CM | POA: Diagnosis not present

## 2016-01-01 DIAGNOSIS — G311 Senile degeneration of brain, not elsewhere classified: Secondary | ICD-10-CM | POA: Diagnosis not present

## 2016-01-01 DIAGNOSIS — R0609 Other forms of dyspnea: Secondary | ICD-10-CM

## 2016-01-01 DIAGNOSIS — Z515 Encounter for palliative care: Secondary | ICD-10-CM | POA: Diagnosis not present

## 2016-01-01 DIAGNOSIS — F0390 Unspecified dementia without behavioral disturbance: Secondary | ICD-10-CM | POA: Diagnosis not present

## 2016-01-01 NOTE — Consult Note (Signed)
Consultation Note Date: 01/01/2016   Patient Name: [name]  DOB: 04-26-1923  MRN: [id number]  Age / Sex: 82 y.o., female  PCP: Carylon Perches, MD Referring Physician: Carylon Perches, MD  Reason for Consultation: Disposition, Establishing goals of care, Hospice Evaluation and Psychosocial/spiritual support    Clinical Assessment/Narrative: [name] is resting quietly in bed. She is able to make eye contact with me, but is unable to have any meaningful conversation. There is no family at bedside at this time.  Call to daughter Jamie Hafford. She shares [name]'s recent decreased ability to eat. She shares that the option of a feeding tube has been offered, but that the family has decided against a feeding tube per [name]'s wishes. We talk about having a speech therapy consultation to offer suggestions for best safe practices for home feedings. [name] shares that last week her mother was "eating normally", but that she realizes with Parkinson's, things can change quickly.  I ask if the family's goal is to take [name] home with hospice, and [name] shares that their "finances are stretched", she shares that they are considering long-term care placement. [name] shares that [name] was a resident at [facility] for 8 months after her last surgery.  She also shares that her father had hospice in their home when he passed.  She is aware of the benefits and limitations of each. She shares that they had "always hoped mother could pass at home but...". We plan for a family meeting 3/10 at 11 AM, with the goal to provide best option for disposition for this family.  Contacts/Participants in Discussion:  daughter/HCPOA Lynnea Maizes. Primary Decision Maker: daughter/HCPOA Lynnea Maizes along with niece Public librarian. Relationship to Patient as above HCPOA: yes    SUMMARY OF RECOMMENDATIONS  Code Status/Advance Care Planning: DNR    Code Status  Orders        Start     Ordered   12/27/15 1404  Do not attempt resuscitation (DNR)   Continuous    Question Answer Comment  In the event of cardiac or respiratory ARREST Do not call a "code blue"   In the event of cardiac or respiratory ARREST Do not perform Intubation, CPR, defibrillation or ACLS   In the event of cardiac or respiratory ARREST Use medication by any route, position, wound care, and other measures to relive pain and suffering. May use oxygen, suction and manual treatment of airway obstruction as needed for comfort.      12/27/15 1405    Code Status History    Date Active Date Inactive Code Status Order ID Comments User Context   12/27/2015  1:03 PM 12/27/2015  2:05 PM DNR [id number]  Gerhard Munch, MD ED   04/29/2015  6:03 AM 05/01/2015  6:59 PM DNR [id number]  Meredeth Ide, MD ED   06/15/2014  3:26 PM 06/19/2014  7:09 PM DNR [id number]  Elliot Cousin, MD Inpatient   06/15/2014  2:47 PM 06/15/2014  3:26 PM Full Code [id number]  Elliot Cousin, MD Inpatient   05/06/2013  2:14 PM 05/09/2013  5:24 PM DNR [id number]  Wilson Singer, MD ED   05/06/2013  1:49 PM 05/06/2013  2:14 PM DNR [id number]  Joya Gaskins, MD ED   08/02/2011  7:27 AM 08/19/2011  2:33 PM DNR [id number]  Carylon Perches, MD Inpatient   06/09/2011  8:21 AM 06/18/2011  4:44 PM DNR [id number]  Carylon Perches, MD Inpatient    Advance Directive Documentation  Most Recent Value   Type of Advance Directive  Living will, Healthcare Power of Attorney   Pre-existing out of facility DNR order (yellow form or pink MOST form)     "MOST" Form in Place?        Other Directives:None  Symptom Management:   per Dr. [name].  Palliative Prophylaxis:   Frequent Pain Assessment, Oral Care and Turn Reposition  Additional Recommendations (Limitations, Scope, Preferences):  Treat the treatable at this point.  Psycho-social/Spiritual:  Support System: Strong Desire for further Chaplaincy support: not discussed today Additional  Recommendations: None at this time  Prognosis: Unable to determine, based on outcomes. Likely less than 6 months based on past medical history, recent decline, and poor PO intake.  Discharge Planning: Undecided at this time. Daughter [name] is considering home with hospice versus placement in long-term care.   Chief Complaint/ Primary Diagnoses: Present on Admission:  . UTI (urinary tract infection), bacterial . SIRS (systemic inflammatory response syndrome) (HCC) . AKI (acute kidney injury) (HCC) . Loose stools . Parkinson disease (HCC) . Acute encephalopathy . Symptoms of upper respiratory infection (URI)  I have reviewed the medical record, interviewed the patient and family, and examined the patient. The following aspects are pertinent.  Past Medical History  Diagnosis Date  . Hypertension   . GERD (gastroesophageal reflux disease)   . Parkinson disease (HCC)   . Depression   . Pulmonary embolism (HCC)   . Arthritis   . Hypercalcemia   . Diabetes mellitus     family denies  . GI bleeding 04/29/2015  . Anemia 04/29/2015  . Lateral malleolar fracture 04/30/2013   Social History   Social History  . Marital Status: Widowed    Spouse Name: N/A  . Number of Children: 1  . Years of Education: 5   Social History Main Topics  . Smoking status: Never Smoker   . Smokeless tobacco: Never Used  . Alcohol Use: No  . Drug Use: No  . Sexual Activity: Not Asked   Other Topics Concern  . None   Social History Narrative   Patient is living at home with caregivers.    Patient is widowed.    Patient has 1 child.    Patient is retired.    Patient has a 5th grade education.          Family History  Problem Relation Age of Onset  . Diabetes Mother    Scheduled Meds: . baclofen  5 mg Oral BID  . carbidopa-levodopa  1 tablet Oral TID  . cefTRIAXone (ROCEPHIN)  IV  1 g Intravenous Q24H  . enoxaparin (LOVENOX) injection  30 mg Subcutaneous Q24H  . feeding supplement  1 Container  Oral TID BM  . [START ON 01/02/2016] pantoprazole (PROTONIX) IV  40 mg Intravenous QODAY  . PARoxetine  10 mg Oral q morning - 10a   Continuous Infusions: . sodium chloride 50 mL/hr at 01/01/16 0941   PRN Meds:.acetaminophen **OR** acetaminophen, albuterol, morphine CONCENTRATE, ondansetron **OR** ondansetron (ZOFRAN) IV Medications Prior to Admission:  Prior to Admission medications   Medication Sig Start Date End Date Taking? Authorizing Provider  acetaminophen (TYLENOL) 325 MG tablet Take 650 mg by mouth 2 (two) times daily. Also given as needed for mild pain in addition to twice daily   Yes Historical Provider, MD  baclofen (LIORESAL) 10 MG tablet Take 0.5 tablets (5 mg total) by mouth 3 (three) times daily. Patient taking differently: Take 5 mg by mouth 2 (two) times  daily.  05/01/15  Yes Carylon Perches, MD  carbidopa-levodopa (SINEMET IR) 25-100 MG tablet TAKE 1 TABLET BY MOUTH THREE TIMES DAILY. 11/20/15  Yes Nilda Riggs, NP  mirtazapine (REMERON) 15 MG tablet Take 15 mg by mouth at bedtime.     Yes Historical Provider, MD  omeprazole (PRILOSEC) 10 MG capsule Take 2 capsules (20 mg total) by mouth every morning. 05/01/15  Yes Carylon Perches, MD  PARoxetine (PAXIL) 10 MG tablet Take 10 mg by mouth every morning.   Yes Historical Provider, MD  trimethoprim (TRIMPEX) 100 MG tablet Take 100 mg by mouth at bedtime. 01/08/13  Yes Historical Provider, MD  LORazepam (ATIVAN) 0.5 MG tablet Take 1 tablet (0.5 mg total) by mouth every 6 (six) hours as needed for anxiety. 05/09/13   Carylon Perches, MD  traMADol (ULTRAM) 50 MG tablet Take 50 mg by mouth daily as needed for pain.    Historical Provider, MD   Allergies  Allergen Reactions  . Reglan [Metoclopramide] Other (See Comments)    Tremors really bad   . Ambien [Zolpidem]   . Metoclopramide Hcl Other (See Comments)    Affected Nervous system- severe jerkiing, shaking, etc..    Review of Systems  Unable to perform ROS: Dementia    Physical Exam    Nursing note and vitals reviewed. Constitutional: No distress.  HENT:  Head: Normocephalic and atraumatic.  Respiratory: No respiratory distress.  GI: Soft. She exhibits no distension. There is no guarding.  Neurological: She is alert.  Skin: Skin is warm and dry.    Vital Signs: BP 161/68 mmHg  Pulse 74  Temp(Src) 97.6 F (36.4 C) (Oral)  Resp 15  Ht 4\' 8"  (1.422 m)  Wt 50.6 kg (111 lb 8.8 oz)  BMI 25.02 kg/m2  SpO2 94%  SpO2: SpO2: 94 % O2 Device:SpO2: 94 % O2 Flow Rate: .   IO: Intake/output summary: No intake or output data in the 24 hours ending 01/01/16 1148  LBM: Last BM Date: 12/26/15 Baseline Weight: Weight: 50.6 kg (111 lb 8.8 oz) Most recent weight: Weight: 50.6 kg (111 lb 8.8 oz)      Palliative Assessment/Data:  Flowsheet Rows        Most Recent Value   Intake Tab    Referral Department  -- [internal medicine]   Unit at Time of Referral  Med/Surg Unit   Palliative Care Primary Diagnosis  Other (Comment) [FTT]   Date Notified  01/01/16   Palliative Care Type  New Palliative care   Reason for referral  Clarify Goals of Care, Counsel Regarding Hospice   Date of Admission  12/27/15   Date first seen by Palliative Care  01/01/16   # of days Palliative referral response time  0 Day(s)   # of days IP prior to Palliative referral  5   Clinical Assessment    Psychosocial & Spiritual Assessment    Palliative Care Outcomes       Additional Data Reviewed:  CBC:    Component Value Date/Time   WBC 8.1 12/29/2015 0732   HGB 10.4* 12/29/2015 0732   HCT 31.0* 12/29/2015 0732   PLT 278 12/29/2015 0732   MCV 102.0* 12/29/2015 0732   NEUTROABS 6.4 12/29/2015 0732   LYMPHSABS 1.0 12/29/2015 0732   MONOABS 0.7 12/29/2015 0732   EOSABS 0.1 12/29/2015 0732   BASOSABS 0.0 12/29/2015 0732   Comprehensive Metabolic Panel:    Component Value Date/Time   NA 138 12/31/2015 0603   K 3.8  12/31/2015 0603   CL 107 12/31/2015 0603   CO2 25 12/31/2015 0603   BUN 9  12/31/2015 0603   CREATININE 0.78 12/31/2015 0603   GLUCOSE 105* 12/31/2015 0603   CALCIUM 8.3* 12/31/2015 0603   AST 18 12/27/2015 1115   ALT 5* 12/27/2015 1115   ALKPHOS 131* 12/27/2015 1115   BILITOT 0.4 12/27/2015 1115   PROT 7.4 12/27/2015 1115   ALBUMIN 3.5 12/27/2015 1115     Time In: 1530 Time Out:  1630 Time Total: 60 minutes Greater than 50%  of this time was spent counseling and coordinating care related to the above assessment and plan.  Signed by: Katheran Aweove,Tasha A, NP  Katheran Aweasha A Dove, NP  01/01/2016, 11:48 AM  Please contact Palliative Medicine Team phone at [phone] for questions and concerns.

## 2016-01-01 NOTE — Care Management Note (Signed)
Case Management Note  Patient Details  Name: [name] MRN: [id number] Date of Birth: 05/26/1923  Subjective/Objective:     Awaiting palliative care consult. Spoke with attending this morning who has discussed care with Family feels my be home with hospice appropriate. Failure to thrive not eating.  Will discuss further with family and palliative.              Action/Plan:  Home with Hospice.   Expected Discharge Date:                  Expected Discharge Plan:  Home w Hospice Care  In-House Referral:     Discharge planning Services  CM Consult  Post Acute Care Choice:    Choice offered to:     DME Arranged:    DME Agency:     HH Arranged:    HH Agency:     Status of Service:  In process, will continue to follow  Medicare Important Message Given:    Date Medicare IM Given:    Medicare IM give by:    Date Additional Medicare IM Given:    Additional Medicare Important Message give by:     If discussed at Long Length of Stay Meetings, dates discussed:    Additional Comments:  [name], Amy Grays L, RN 01/01/2016, 11:47 AM

## 2016-01-01 NOTE — Progress Notes (Signed)
Subjective: [name] is awake but lethargic this morning. She is unable to swallow effectively. She coughs with any attempt at swallowing liquids or applesauce. No fever.  Objective: Vital signs in last 24 hours: Filed Vitals:   12/31/15 0700 12/31/15 1330 01/01/16 0037 01/01/16 0414  BP: 159/98 150/64 153/75 161/68  Pulse: 88 72 79 74  Temp: 97.5 F (36.4 C) 97.9 F (36.6 C) 98.9 F (37.2 C) 97.6 F (36.4 C)  TempSrc: Oral  Oral Oral  Resp: Height:      Weight:      SpO2: 94% 96% 94% 94%   Weight change:  No intake or output data in the 24 hours ending 01/01/16 0737  Physical Exam: No distress. Lungs clear. Heart regular with no murmurs. Abdomen soft and nontender. Extremities reveal no edema.   Lab Results:   No results found for this or any previous visit (from the past 24 hour(s)).   ABGS No results for input(s): PHART, PO2ART, TCO2, HCO3 in the last 72 hours.  Invalid input(s): PCO2 CULTURES Recent Results (from the past 240 hour(s))  Urine culture     Status: None   Collection Time: 12/27/15  1:05 PM  Result Value Ref Range Status   Specimen Description URINE, CATHETERIZED  Final   Special Requests NONE  Final   Culture   Final    >=100,000 COLONIES/mL ESCHERICHIA COLI Performed at [facility]    Report Status 12/30/2015 FINAL  Final   Organism ID, Bacteria ESCHERICHIA COLI  Final      Susceptibility   Escherichia coli - MIC*    AMPICILLIN >=32 RESISTANT Resistant     CEFAZOLIN <=4 SENSITIVE Sensitive     CEFTRIAXONE <=1 SENSITIVE Sensitive     CIPROFLOXACIN >=4 RESISTANT Resistant     GENTAMICIN 4 SENSITIVE Sensitive     IMIPENEM <=0.25 SENSITIVE Sensitive     NITROFURANTOIN <=16 SENSITIVE Sensitive     TRIMETH/SULFA >=320 RESISTANT Resistant     AMPICILLIN/SULBACTAM 8 SENSITIVE Sensitive     PIP/TAZO <=4 SENSITIVE Sensitive     * >=100,000 COLONIES/mL ESCHERICHIA COLI   Studies/Results: No results found. Micro  Results: Recent Results (from the past 240 hour(s))  Urine culture     Status: None   Collection Time: 12/27/15  1:05 PM  Result Value Ref Range Status   Specimen Description URINE, CATHETERIZED  Final   Special Requests NONE  Final   Culture   Final    >=100,000 COLONIES/mL ESCHERICHIA COLI Performed at [facility]    Report Status 12/30/2015 FINAL  Final   Organism ID, Bacteria ESCHERICHIA COLI  Final      Susceptibility   Escherichia coli - MIC*    AMPICILLIN >=32 RESISTANT Resistant     CEFAZOLIN <=4 SENSITIVE Sensitive     CEFTRIAXONE <=1 SENSITIVE Sensitive     CIPROFLOXACIN >=4 RESISTANT Resistant     GENTAMICIN 4 SENSITIVE Sensitive     IMIPENEM <=0.25 SENSITIVE Sensitive     NITROFURANTOIN <=16 SENSITIVE Sensitive     TRIMETH/SULFA >=320 RESISTANT Resistant     AMPICILLIN/SULBACTAM 8 SENSITIVE Sensitive     PIP/TAZO <=4 SENSITIVE Sensitive     * >=100,000 COLONIES/mL ESCHERICHIA COLI   Studies/Results: No results found. Medications:  I have reviewed the patient's current medications Scheduled Meds: . baclofen  5 mg Oral BID  . carbidopa-levodopa  1 tablet Oral TID  . cefTRIAXone (ROCEPHIN)  IV  1 g Intravenous Q24H  .  enoxaparin (LOVENOX) injection  30 mg Subcutaneous Q24H  . feeding supplement  1 Container Oral TID BM  . [START ON 01/02/2016] pantoprazole (PROTONIX) IV  40 mg Intravenous QODAY  . PARoxetine  10 mg Oral q morning - 10a   Continuous Infusions: . sodium chloride 50 mL/hr at 12/31/15 0937   PRN Meds:.acetaminophen **OR** acetaminophen, albuterol, morphine CONCENTRATE, ondansetron **OR** ondansetron (ZOFRAN) IV   Assessment/Plan: #1. Escherichia coli UTI. Continue ceftriaxone. Unable to swallow oral therapy. #2. End-stage Parkinson's disease/dysphagia. Consult hospice. #3. Acute kidney injury. Resolved. #4. Hyperkalemia. Resolved.   Principal Problem:   Symptoms of upper respiratory infection (URI) Active Problems:   Parkinson  disease (HCC)   UTI (urinary tract infection), bacterial   SIRS (systemic inflammatory response syndrome) (HCC)   AKI (acute kidney injury) (HCC)   Loose stools   Acute encephalopathy     LOS: 5 days   Phoebie Shad 01/01/2016, 7:37 AM

## 2016-01-02 DIAGNOSIS — R4182 Altered mental status, unspecified: Secondary | ICD-10-CM | POA: Diagnosis not present

## 2016-01-02 DIAGNOSIS — R0600 Dyspnea, unspecified: Secondary | ICD-10-CM | POA: Diagnosis not present

## 2016-01-02 DIAGNOSIS — R279 Unspecified lack of coordination: Secondary | ICD-10-CM | POA: Diagnosis not present

## 2016-01-02 DIAGNOSIS — Z515 Encounter for palliative care: Secondary | ICD-10-CM | POA: Diagnosis not present

## 2016-01-02 DIAGNOSIS — I2699 Other pulmonary embolism without acute cor pulmonale: Secondary | ICD-10-CM | POA: Diagnosis not present

## 2016-01-02 DIAGNOSIS — G20 Parkinson's disease: Secondary | ICD-10-CM | POA: Diagnosis not present

## 2016-01-02 DIAGNOSIS — F0390 Unspecified dementia without behavioral disturbance: Secondary | ICD-10-CM | POA: Diagnosis not present

## 2016-01-02 DIAGNOSIS — Z7401 Bed confinement status: Secondary | ICD-10-CM | POA: Diagnosis not present

## 2016-01-02 DIAGNOSIS — G311 Senile degeneration of brain, not elsewhere classified: Secondary | ICD-10-CM | POA: Diagnosis not present

## 2016-01-02 DIAGNOSIS — Z7189 Other specified counseling: Secondary | ICD-10-CM | POA: Diagnosis not present

## 2016-01-02 NOTE — Progress Notes (Signed)
Daily Progress Note   Patient Name: [name]       Date: 01/02/2016 DOB: 1923-03-06  Age: 82 y.o. MRN#: [id number] Attending Physician: Carylon Perches, MD Primary Care Physician: Carylon Perches, MD Admit Date: 12/27/2015  Reason for Consultation/Follow-up: Disposition, Establishing goals of care and Psychosocial/spiritual support  Subjective: [name] is resting quietly in bed. Her daughter [name] and her niece [name] tutor are at bedside. We talk about disposition plan, home with home health versus hospice versus rehabilitation in the skilled nursing home. At this time there is no speech therapist if anything hospital on Fridays and she is unable to be evaluated for ST or PT. This is shared with the family and they choose disposition to home. At this point there deciding on providers. [facility]'ve had [facility] the past, but they are considering [facility] is for in-home hospice services.  We talk about [name]'s acute changing condition. And family shares that two weeks ago she was able to help them with transfers and 1 week ago she was feeding herself.  We talk about the chronic illness trajectory, and also declines that occur with Parkinson's dementias. We talk about [name]'s decision to "allowing natural death".  We also talk about the concepts of do not re-hospitalize, and do not treat the next infection. [name] shares that she had reservations about bringing [name] to the hospital this time. She becomes tearful. [name] and [name] share their love for [name] and the rigors of caring for her these last 6 years.  [name] and I talk about the options of starting with home health and transitioning to hospice care, or starting with hospice care and, if [name] improves, returning to home  health services. Family will continue to work with case management for placement.  Length of Stay: 6 days  Current Medications: Scheduled Meds:  . baclofen  5 mg Oral BID  . carbidopa-levodopa  1 tablet Oral TID  . cefTRIAXone (ROCEPHIN)  IV  1 g Intravenous Q24H  . enoxaparin (LOVENOX) injection  30 mg Subcutaneous Q24H  . feeding supplement  1 Container Oral TID BM  . pantoprazole (PROTONIX) IV  40 mg Intravenous QODAY  . PARoxetine  10 mg Oral q morning - 10a    Continuous Infusions: . sodium chloride 50  mL/hr at 01/02/16 0730    PRN Meds: acetaminophen **OR** acetaminophen, ondansetron **OR** ondansetron (ZOFRAN) IV  Physical Exam: Physical Exam  Constitutional: No distress.  Pulmonary/Chest: Effort normal. No respiratory distress.  Abdominal: Soft. There is no guarding.  Neurological:  Lethargic, but will open eyes when prompted.  Skin: Skin is warm and dry.  Nursing note and vitals reviewed.               Vital Signs: BP 141/54 mmHg  Pulse 78  Temp(Src) 98.6 F (37 C) (Oral)  Resp 15  Ht 4\' 8"  (1.422 m)  Wt 50.6 kg (111 lb 8.8 oz)  BMI 25.02 kg/m2  SpO2 96% SpO2: SpO2: 96 % O2 Device: O2 Device: Not Delivered O2 Flow Rate:    Intake/output summary:  Intake/Output Summary (Last 24 hours) at 01/02/16 1402 Last data filed at 01/02/16 1300  Gross per 24 hour  Intake 3146.67 ml  Output      0 ml  Net 3146.67 ml   LBM: Last BM Date: 12/26/15 Baseline Weight: Weight: 50.6 kg (111 lb 8.8 oz) Most recent weight: Weight: 50.6 kg (111 lb 8.8 oz)       Palliative Assessment/Data: Flowsheet Rows        Most Recent Value   Intake Tab    Referral Department  -- [internal medicine]   Unit at Time of Referral  Med/Surg Unit   Palliative Care Primary Diagnosis  Other (Comment) [FTT]   Date Notified  01/01/16   Palliative Care Type  New Palliative care   Reason for referral  Clarify Goals of Care, Counsel Regarding Hospice   Date of Admission  12/27/15   Date  first seen by Palliative Care  01/01/16   # of days Palliative referral response time  0 Day(s)   # of days IP prior to Palliative referral  5   Clinical Assessment    Palliative Performance Scale Score  20%   Pain Max last 24 hours  Not able to report   Pain Min Last 24 hours  Not able to report   Dyspnea Max Last 24 Hours  Not able to report   Dyspnea Min Last 24 hours  Not able to report   Psychosocial & Spiritual Assessment    Social Work Plan of Care  Staff support   Palliative Care Outcomes    Patient/Family meeting held?  Yes   Who was at the meeting?  via phone with daughter   Palliative Care Outcomes  Provided end of life care assistance, Provided psychosocial or spiritual support, Clarified goals of care, Provided advance care planning   Patient/Family wishes: Interventions discontinued/not started   Mechanical Ventilation, Tube feedings/TPN   Palliative Care follow-up planned  -- [yes at [facility]]      Additional Data Reviewed: CBC    Component Value Date/Time   WBC 8.1 12/29/2015 0732   RBC 3.04* 12/29/2015 0732   HGB 10.4* 12/29/2015 0732   HCT 31.0* 12/29/2015 0732   PLT 278 12/29/2015 0732   MCV 102.0* 12/29/2015 0732   MCH 34.2* 12/29/2015 0732   MCHC 33.5 12/29/2015 0732   RDW 14.0 12/29/2015 0732   LYMPHSABS 1.0 12/29/2015 0732   MONOABS 0.7 12/29/2015 0732   EOSABS 0.1 12/29/2015 0732   BASOSABS 0.0 12/29/2015 0732    CMP     Component Value Date/Time   NA 138 12/31/2015 0603   K 3.8 12/31/2015 0603   CL 107 12/31/2015 0603   CO2 25 12/31/2015 0603  GLUCOSE 105* 12/31/2015 0603   BUN 9 12/31/2015 0603   CREATININE 0.78 12/31/2015 0603   CALCIUM 8.3* 12/31/2015 0603   PROT 7.4 12/27/2015 1115   ALBUMIN 3.5 12/27/2015 1115   AST 18 12/27/2015 1115   ALT 5* 12/27/2015 1115   ALKPHOS 131* 12/27/2015 1115   BILITOT 0.4 12/27/2015 1115   GFRNONAA >60 12/31/2015 0603   GFRAA >60 12/31/2015 0603       Problem List:  Patient Active Problem List    Diagnosis Date Noted  . Altered mental status   . Palliative care encounter   . Advanced care planning/counseling discussion   . UTI (urinary tract infection), bacterial 12/27/2015  . SIRS (systemic inflammatory response syndrome) (HCC) 12/27/2015  . AKI (acute kidney injury) (HCC) 12/27/2015  . Loose stools 12/27/2015  . Acute encephalopathy 12/27/2015  . Symptoms of upper respiratory infection (URI) 12/27/2015  . GI bleeding 04/29/2015  . Anemia 04/29/2015  . Abnormality of gait 08/09/2014  . Acute renal failure (HCC) 06/15/2014  . Hyponatremia 06/15/2014  . Metabolic acidosis 06/15/2014  . Anemia, unspecified 06/15/2014  . UTI (lower urinary tract infection) 06/15/2014  . Memory loss 06/19/2013  . Altered mental state 05/06/2013  . UTI (urinary tract infection) 05/06/2013  . Parkinson disease (HCC) 05/06/2013  . Lateral malleolar fracture 04/30/2013  . Contact ulcers 04/30/2013  . Pulmonary embolism (HCC)   . HYPOTENSION 01/07/2011  . GERD 01/07/2011  . DYSPHAGIA 01/07/2011     Palliative Care Assessment & Plan    1.Code Status:  DNR    Code Status Orders        Start     Ordered   12/27/15 1404  Do not attempt resuscitation (DNR)   Continuous    Question Answer Comment  In the event of cardiac or respiratory ARREST Do not call a "code blue"   In the event of cardiac or respiratory ARREST Do not perform Intubation, CPR, defibrillation or ACLS   In the event of cardiac or respiratory ARREST Use medication by any route, position, wound care, and other measures to relive pain and suffering. May use oxygen, suction and manual treatment of airway obstruction as needed for comfort.      12/27/15 1405    Code Status History    Date Active Date Inactive Code Status Order ID Comments User Context   12/27/2015  1:03 PM 12/27/2015  2:05 PM DNR [id number]  Gerhard Munchobert Lockwood, MD ED   04/29/2015  6:03 AM 05/01/2015  6:59 PM DNR [id number]  Meredeth IdeGagan S Lama, MD ED   06/15/2014  3:26 PM  06/19/2014  7:09 PM DNR [id number]  Elliot Cousinenise Fisher, MD Inpatient   06/15/2014  2:47 PM 06/15/2014  3:26 PM Full Code [id number]  Elliot Cousinenise Fisher, MD Inpatient   05/06/2013  2:14 PM 05/09/2013  5:24 PM DNR [id number]  Wilson SingerNimish C Gosrani, MD ED   05/06/2013  1:49 PM 05/06/2013  2:14 PM DNR [id number]  Joya Gaskinsonald W Wickline, MD ED   08/02/2011  7:27 AM 08/19/2011  2:33 PM DNR [id number]  Carylon Perchesoy Fagan, MD Inpatient   06/09/2011  8:21 AM 06/18/2011  4:44 PM DNR [id number]  Carylon Perchesoy Fagan, MD Inpatient    Advance Directive Documentation        Most Recent Value   Type of Advance Directive  Living will, Healthcare Power of Attorney   Pre-existing out of facility DNR order (yellow form or pink MOST form)     "MOST" Form in Place?  2. Goals of Care/Additional Recommendations:  Treat the treatable at this point. We do discuss the concepts of do not treat the next infection do not rehospitalized. Daughter [name] states that she considered not bringing Ms. Dowe to the hospital this time.  Limitations on Scope of Treatment: Treat the treatable at this time  Desire for further Chaplaincy support:no  Psycho-social Needs: None at this time.  3. Symptom Management:      1. Per Dr. [name].  4. Palliative Prophylaxis:   Per Dr. [name].  5. Prognosis: Unable to determine, based on outcomes. Likely less than 6 months due to chronic health problems including Parkinson's, decreased mobility, and decreased intake.  6. Discharge Planning:  Home with Home Health, family is still struggling with the choice of hospice services versus home health. We discussed the concepts of excepting home health services in transitioning to hospice when appropriate.   Care plan was discussed with Case manager, Child psychotherapist.   Thank you for allowing the Palliative Medicine Team to assist in the care of this patient.   Time In: 1130 Time Out: 1300 Total Time 90 Minutes  Prolonged Time Billed  yes         Katheran Awe, NP  01/02/2016, 2:02 PM    Please contact Palliative Medicine Team phone at [phone] for questions and concerns.

## 2016-01-02 NOTE — Care Management Important Message (Signed)
Important Message  Patient Details  Name: [name] MRN: [id number] Date of Birth: 10/21/1923   Medicare Important Message Given:  Yes    Adonis HugueninBerkhead, Melenda Bielak L, RN 01/02/2016, 7:58 AM

## 2016-01-02 NOTE — Clinical Social Work Note (Signed)
CSW notes that the patient is planning to return home at discharge. CSW signing off at this time.   Roddie McBryant Lakitha Gordy MSW, [place]er[place]CSW, [place], [phone]

## 2016-01-02 NOTE — Progress Notes (Signed)
SLP Cancellation Note  Patient Details Name: [name] MRN: [id number] DOB: 09/11/1923   Cancelled treatment:       Reason Eval/Treat Not Completed: Medical issues which prohibited therapy; family determined goals of care this am at palliative meeting; per MD report family wishes not to pursue ST consult given severity of pts medical condition, ST to sign off     Marcene Duoshelsea Sumney MA, CCC-SLP Acute Care Speech Language Pathologist    Kennieth RadSumney, Burgess Sheriff E 01/02/2016, 1:24 PM

## 2016-01-02 NOTE — Care Management Note (Signed)
Case Management Note  Patient Details  Name: [name] MRN: [id number] Date of Birth: 07/20/1923  Subjective/Objective:   Spoke with family at the bedside Jody the daughter and primary decision maker and Rodney Boozeasha from palliative care present. Patient resting in bed with eyes closed.  Discussion was held concerning various options for discharge care. Discussed SNF placement vs home with hospice and home with home health.  Speech therapy had been ordered to evaluate but had not seen patient yet. Family expressed openness to Home with Hospice due to question whether patient would have skill able need for for SNF placement.  Accodring to the daughter Darel HongJudy patient has been declining over the past few weeks until finally being admitted to hospital. PEG tube had been offered by attending but family did not want this. Discussed Home Health vs Home with hospice and family after discussing options family decided that home with hospice would be the best option at this time. Account representative with [facility] hospice was contacted to discuss services. Information relayed to family. If patient returns to baseline then hospice would be able to change to Home Health. At which time [facility] would be able to assume care. Family expressed being "comfortable" with this plan.  Referral placed with [facility] hospice patient for discharge . Contacted Dr Ouida SillsFagan to inform of plan.   Action/Plan: Discharge to home with Hospice services.   Expected Discharge Date:                  Expected Discharge Plan:  Home w Hospice Care  In-House Referral:  Hospice / Palliative Care  Discharge planning Services  CM Consult  Post Acute Care Choice:  Durable Medical Equipment Choice offered to:  Adult Children  DME Arranged:   (hoyer lift) DME Agency:   ([facility])  HH Arranged:    HH Agency:     Status of Service:  Completed, signed off  Medicare Important Message Given:  Yes Date Medicare IM Given:    Medicare IM  give by:    Date Additional Medicare IM Given:    Additional Medicare Important Message give by:     If discussed at Long Length of Stay Meetings, dates discussed:    Additional Comments:  Adonis HugueninBerkhead, Dhruti Ghuman L, RN 01/02/2016, 2:13 PM

## 2016-01-03 DIAGNOSIS — G20 Parkinson's disease: Secondary | ICD-10-CM | POA: Diagnosis not present

## 2016-01-03 DIAGNOSIS — G311 Senile degeneration of brain, not elsewhere classified: Secondary | ICD-10-CM | POA: Diagnosis not present

## 2016-01-03 DIAGNOSIS — I2699 Other pulmonary embolism without acute cor pulmonale: Secondary | ICD-10-CM | POA: Diagnosis not present

## 2016-01-03 DIAGNOSIS — F0390 Unspecified dementia without behavioral disturbance: Secondary | ICD-10-CM | POA: Diagnosis not present

## 2016-01-04 NOTE — Discharge Summary (Signed)
Physician Discharge Summary  Christianne BorrowHallie W Doe ZOX:[id number] DOB: 02/04/1923 DOA: 12/27/2015   Admit date: 12/27/2015 Discharge date: 01/04/2016  Discharge Diagnoses:  Principal Problem:   Symptoms of upper respiratory infection (URI) Active Problems:   Parkinson disease (HCC)   UTI (urinary tract infection), bacterial   SIRS (systemic inflammatory response syndrome) (HCC)   AKI (acute kidney injury) (HCC)   Loose stools   Acute encephalopathy   Altered mental status   Palliative care encounter   Advanced care planning/counseling discussion    Wt Readings from Last 3 Encounters:  12/27/15 111 lb 8.8 oz (50.6 kg)  04/29/15 126 lb 8.7 oz (57.4 kg)  06/21/14 145 lb (65.772 kg)     Hospital Course:  This patient is a 82 year old female with end-stage Parkinson's disease who presented with diminished responsiveness, generalized weakness, rhinorrhea and cough. Testing for influenza was negative. Chest x-ray revealed no acute infiltrate. Urinalysis revealed too numerous to count WBCs. She was treated with ceftriaxone. Urine culture revealed Escherichia coli which was sensitive to ceftriaxone. She had an initial leukocytosis of 17.3 which normalized to 8.1 with treatment. Lactic acid was 1.3. She had evidence of acute kidney injury with an elevated BUN and creatinine of 23 and 1.6. With hydration she improved to a BUN and creatinine of 9 and 0.7.  She had waxing and waning of her responsiveness. She has difficulty eating due to coughing and gagging upon trying to swallow. Palliative care consult was obtained. The patient was referred to hospice.  Condition at discharge is improved although her near term prognosis appears poor based on her difficulty swallowing liquids and solid food. She also has difficulty with swallowing her usual medications.  As noted hospice consult is in place. She will have hospice care at home. A prescription has been provided for a [employer]oyer lift.   Discharge  Instructions     Medication List    STOP taking these medications        mirtazapine 15 MG tablet  Commonly known as:  REMERON      TAKE these medications        acetaminophen 325 MG tablet  Commonly known as:  TYLENOL  Take 650 mg by mouth 2 (two) times daily. Also given as needed for mild pain in addition to twice daily     baclofen 10 MG tablet  Commonly known as:  LIORESAL  Take 0.5 tablets (5 mg total) by mouth 3 (three) times daily.     carbidopa-levodopa 25-100 MG tablet  Commonly known as:  SINEMET IR  TAKE 1 TABLET BY MOUTH THREE TIMES DAILY.     LORazepam 0.5 MG tablet  Commonly known as:  ATIVAN  Take 1 tablet (0.5 mg total) by mouth every 6 (six) hours as needed for anxiety.     omeprazole 10 MG capsule  Commonly known as:  PRILOSEC  Take 2 capsules (20 mg total) by mouth every morning.     PARoxetine 10 MG tablet  Commonly known as:  PAXIL  Take 10 mg by mouth every morning.     traMADol 50 MG tablet  Commonly known as:  ULTRAM  Take 50 mg by mouth daily as needed for pain.     trimethoprim 100 MG tablet  Commonly known as:  TRIMPEX  Take 100 mg by mouth at bedtime.         Nyella Eckels 01/04/2016

## 2016-04-03 ENCOUNTER — Encounter (HOSPITAL_COMMUNITY): Payer: Self-pay | Admitting: Emergency Medicine

## 2016-04-03 ENCOUNTER — Emergency Department (HOSPITAL_COMMUNITY)
Admission: EM | Admit: 2016-04-03 | Discharge: 2016-04-03 | Disposition: A | Discharge status: Home / Self Care | Payer: PPO | Attending: Emergency Medicine | Admitting: Emergency Medicine

## 2016-04-03 DIAGNOSIS — G20 Parkinson's disease: Secondary | ICD-10-CM | POA: Diagnosis not present

## 2016-04-03 DIAGNOSIS — S0101XA Laceration without foreign body of scalp, initial encounter: Secondary | ICD-10-CM | POA: Diagnosis not present

## 2016-04-03 DIAGNOSIS — Y939 Activity, unspecified: Secondary | ICD-10-CM | POA: Diagnosis not present

## 2016-04-03 DIAGNOSIS — W06XXXA Fall from bed, initial encounter: Secondary | ICD-10-CM | POA: Insufficient documentation

## 2016-04-03 DIAGNOSIS — Y929 Unspecified place or not applicable: Secondary | ICD-10-CM | POA: Diagnosis not present

## 2016-04-03 DIAGNOSIS — Z79899 Other long term (current) drug therapy: Secondary | ICD-10-CM | POA: Insufficient documentation

## 2016-04-03 DIAGNOSIS — E119 Type 2 diabetes mellitus without complications: Secondary | ICD-10-CM | POA: Insufficient documentation

## 2016-04-03 DIAGNOSIS — I10 Essential (primary) hypertension: Secondary | ICD-10-CM | POA: Diagnosis not present

## 2016-04-03 DIAGNOSIS — M1990 Unspecified osteoarthritis, unspecified site: Secondary | ICD-10-CM | POA: Diagnosis not present

## 2016-04-03 DIAGNOSIS — F329 Major depressive disorder, single episode, unspecified: Secondary | ICD-10-CM | POA: Diagnosis not present

## 2016-04-03 DIAGNOSIS — IMO0002 Reserved for concepts with insufficient information to code with codable children: Secondary | ICD-10-CM

## 2016-04-03 DIAGNOSIS — Y999 Unspecified external cause status: Secondary | ICD-10-CM | POA: Diagnosis not present

## 2016-04-03 NOTE — ED Notes (Signed)
Larey SeatFell this am (slide off side of the bed).  Injury to right side of head.  No bleeding noted.  Denies pain.  Not on any blood-thinner.

## 2016-04-03 NOTE — ED Notes (Signed)
Small laceration to right side of head-cleaned. Denies LOC. PT alert per normal;.

## 2016-04-03 NOTE — ED Provider Notes (Signed)
CSN: [id number]     Arrival date & time 04/03/16  1306 History   First MD Initiated Contact with Patient 04/03/16 1348     Chief Complaint  Patient presents with  . Fall     (Consider location/radiation/quality/duration/timing/severity/associated sxs/prior Treatment) Patient is a 82 y.o. female presenting with scalp laceration.  Head Laceration This is a new problem. The problem occurs constantly. The problem has not changed since onset.Pertinent negatives include no chest pain, no headaches and no shortness of breath. Nothing aggravates the symptoms. Nothing relieves the symptoms. She has tried nothing for the symptoms. The treatment provided no relief.    Past Medical History  Diagnosis Date  . Hypertension   . GERD (gastroesophageal reflux disease)   . Parkinson disease (HCC)   . Depression   . Pulmonary embolism (HCC)   . Arthritis   . Hypercalcemia   . Diabetes mellitus     family denies  . GI bleeding 04/29/2015  . Anemia 04/29/2015  . Lateral malleolar fracture 04/30/2013   Past Surgical History  Procedure Laterality Date  . Esophagogastroduodenoscopy  06/10/2011    Procedure: ESOPHAGOGASTRODUODENOSCOPY (EGD);  Surgeon: Malissa Hippo, MD;  Location: [facility];  Service: Endoscopy;  Laterality: N/A;  . Vena cava filter placement    . Esophagogastroduodenoscopy  07/29/2011    Procedure: ESOPHAGOGASTRODUODENOSCOPY (EGD);  Surgeon: Malissa Hippo, MD;  Location: [facility];  Service: Endoscopy;  Laterality: N/A;  . Laparoscopic nissen fundoplication  08/09/2011    Procedure: LAPAROSCOPIC NISSEN FUNDOPLICATION;  Surgeon: Fabio Bering;  Location: [facility];  Service: General;  Laterality: N/A;  Laparoscopic Hiatal Hernia Repair  . Gastrostomy  08/09/2011    Procedure: GASTROSTOMY;  Surgeon: Fabio Bering;  Location: [facility];  Service: General;  Laterality: Left;  Laparoscopic Gastrostomy Tube placement  . Hernia repair     Family History  Problem Relation Age of Onset   . Diabetes Mother    Social History  Substance Use Topics  . Smoking status: Never Smoker   . Smokeless tobacco: Never Used  . Alcohol Use: No   OB History    No data available     Review of Systems  Constitutional: Negative for fever and fatigue.  Respiratory: Negative for cough and shortness of breath.   Cardiovascular: Negative for chest pain.  Skin: Positive for wound.  Neurological: Negative for headaches.  All other systems reviewed and are negative.     Allergies  Reglan; Ambien; and Metoclopramide hcl  Home Medications   Prior to Admission medications   Medication Sig Start Date End Date Taking? Authorizing Provider  acetaminophen (TYLENOL) 325 MG tablet Take 650 mg by mouth 2 (two) times daily. Also given as needed for mild pain in addition to twice daily    Historical Provider, MD  baclofen (LIORESAL) 10 MG tablet Take 0.5 tablets (5 mg total) by mouth 3 (three) times daily. Patient taking differently: Take 5 mg by mouth 2 (two) times daily.  05/01/15   Carylon Perches, MD  carbidopa-levodopa (SINEMET IR) 25-100 MG tablet TAKE 1 TABLET BY MOUTH THREE TIMES DAILY. 11/20/15   Nilda Riggs, NP  LORazepam (ATIVAN) 0.5 MG tablet Take 1 tablet (0.5 mg total) by mouth every 6 (six) hours as needed for anxiety. 05/09/13   Carylon Perches, MD  omeprazole (PRILOSEC) 10 MG capsule Take 2 capsules (20 mg total) by mouth every morning. 05/01/15   Carylon Perches, MD  PARoxetine (PAXIL) 10 MG tablet Take 10 mg  by mouth every morning.    Historical Provider, MD  traMADol (ULTRAM) 50 MG tablet Take 50 mg by mouth daily as needed for pain.    Historical Provider, MD  trimethoprim (TRIMPEX) 100 MG tablet Take 100 mg by mouth at bedtime. 01/08/13   Historical Provider, MD   BP 153/74 mmHg  Pulse 85  Temp(Src) 98 F (36.7 C) (Oral)  Resp 18  Ht  (1.448 m)  Wt 125 lb (56.7 kg)  BMI 27.04 kg/m2  SpO2 100% Physical Exam  Constitutional: She appears well-developed and well-nourished.   HENT:  Head: Normocephalic and atraumatic.  Neck: Normal range of motion.  Cardiovascular: Normal rate and regular rhythm.   Pulmonary/Chest: No stridor. No respiratory distress.  Abdominal: She exhibits no distension.  Musculoskeletal:  No cervical spine tenderness, thoracic spine tenderness or Lumbar spine tenderness.  No tenderness or pain with palpation and full ROM of all joints in upper and lower extremities.  No ecchymosis or other signs of trauma on back or extremities.  No Pain with AP or lateral compression of ribs.  No Paracervical ttp, paraspinal ttp   Neurological: She is alert.  Skin:  2 cm laceration to right parietal scalp  Nursing note and vitals reviewed.   ED Course  .Marland KitchenLaceration Repair Date/Time: 04/03/2016 3:44 PM Performed by: Marily Memos Authorized by: Marily Memos Consent: Verbal consent obtained. Risks and benefits: risks, benefits and alternatives were discussed Consent given by: guardian Time out: Immediately prior to procedure a "time out" was called to verify the correct patient, procedure, equipment, support staff and site/side marked as required. Body area: head/neck Location details: scalp Laceration length: 2 cm Patient sedated: no Preparation: Patient was prepped and draped in the usual sterile fashion. Irrigation solution: saline Irrigation method: syringe Amount of cleaning: standard Debridement: none Degree of undermining: none Skin closure: staples Number of sutures: 4 Technique: simple Approximation: close Approximation difficulty: simple Patient tolerance: Patient tolerated the procedure well with no immediate complications   (including critical care time) Labs Review Labs Reviewed - No data to display  Imaging Review No results found. I have personally reviewed and evaluated these images and lab results as part of my medical decision-making.   EKG Interpretation None      MDM   Final diagnoses:  Laceration     Slid out of bed earlier today hitting the side of her head on the way down on the edge. No loss of consciousness no injuries elsewhere. Exam benign as documented above. Laceration repaired as above. Will come back for removal in 7-10 days or have a nurse come to the house and do it.  Of note patient had a recorded blood pressure 76/47 at this is likely incorrect as I took a couple more blood pressures at the bedside and they were both 150s systolic.  New Prescriptions: Discharge Medication List as of 04/03/2016  2:47 PM       I have personally and contemperaneously reviewed labs and imaging and used in my decision making as above.   A medical screening exam was performed and I feel the patient has had an appropriate workup for their chief complaint at this time and likelihood of emergent condition existing is low and thus workup can continue on an outpatient basis.. Their vital signs are stable. They have been counseled on decision, discharge, follow up and which symptoms necessitate immediate return to the emergency department.  They verbally stated understanding and agreement with plan and discharged in stable condition.  Marily MemosJason Jemeka Wagler, MD 04/03/16 [phone]

## 2016-04-15 IMAGING — CR DG CHEST 1V
1 series · 1 of 1 positions shown · non-contrast
Comparison: Portable exam 9251 hours compared to 12/27/2015

CLINICAL DATA: Sepsis, history hypertension, Parkinson's, prior
pulmonary embolism

EXAM:
CHEST 1 VIEW

[ap]
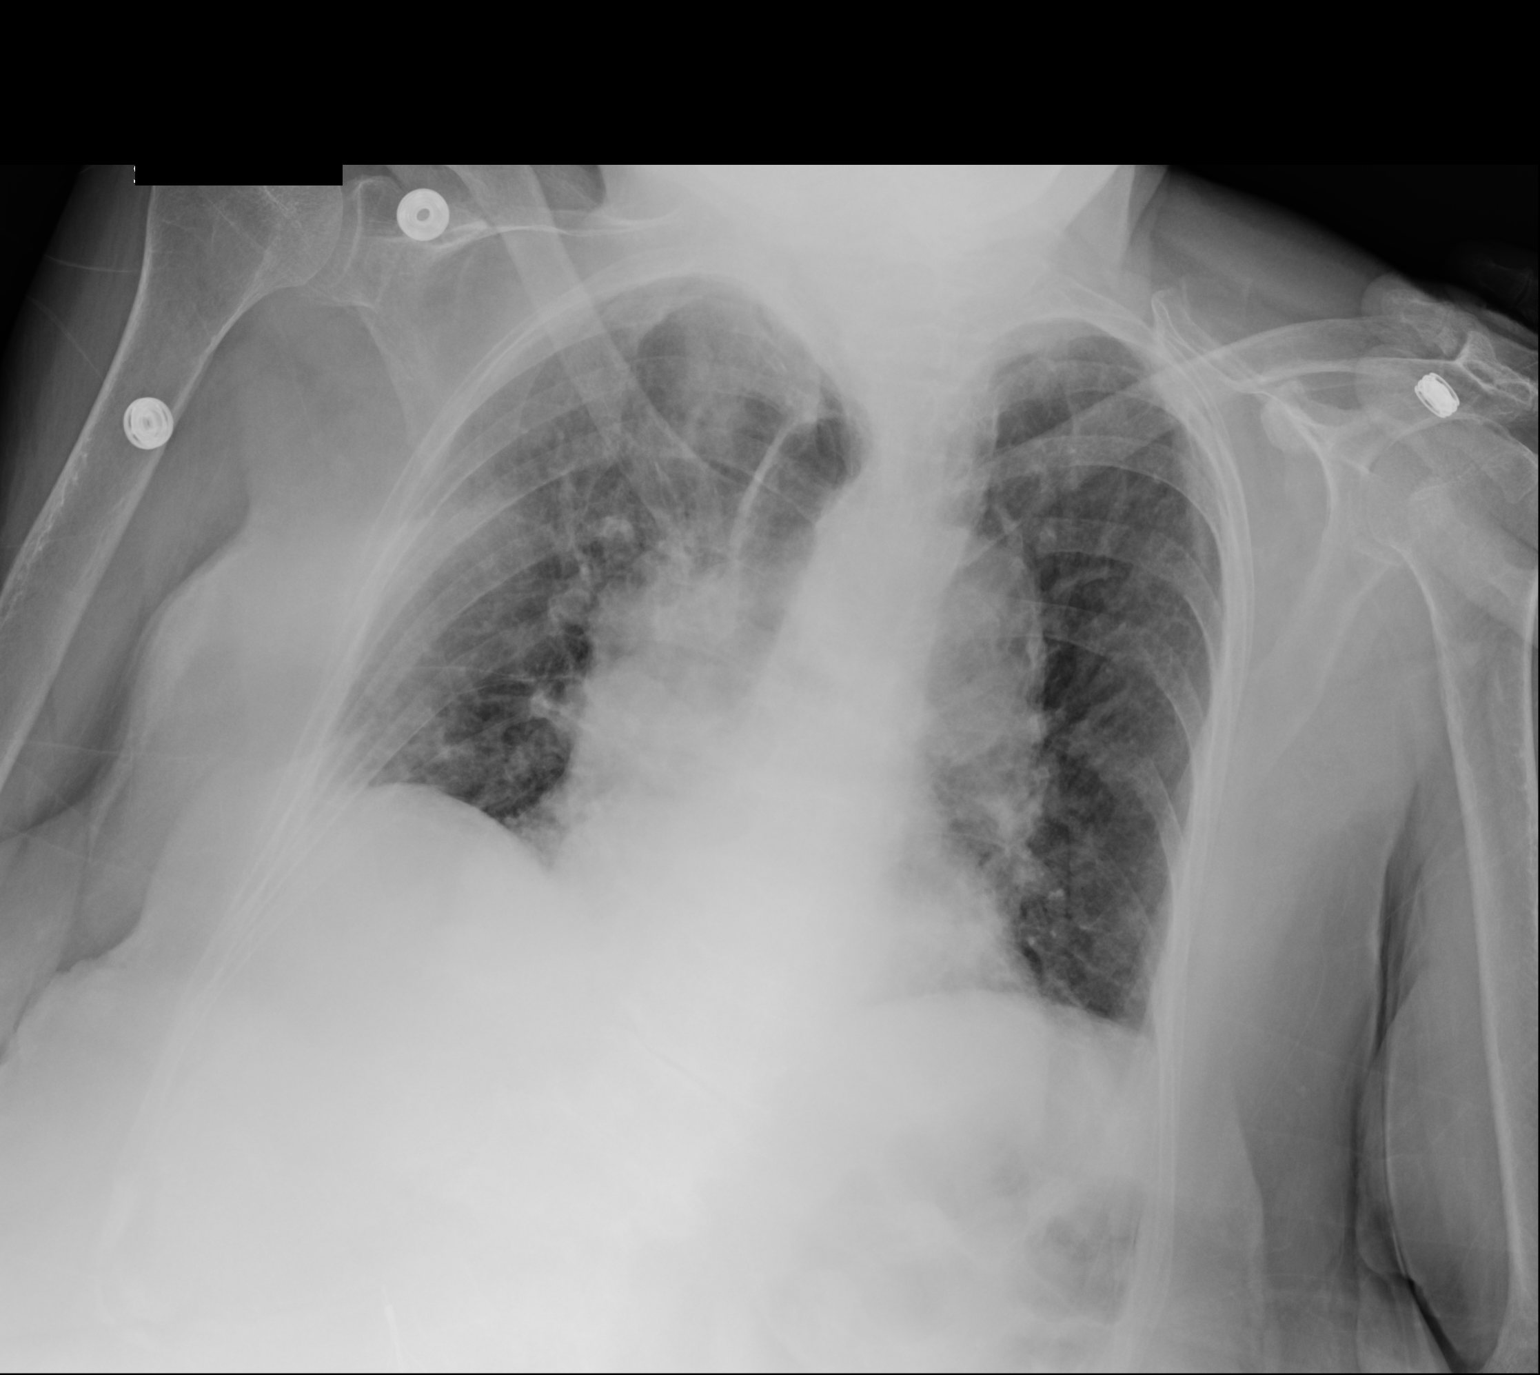

[1 of 1 positions shown; findings below may reference images not displayed]

FINDINGS: Enlargement of cardiac silhouette.

Tortuous aorta.

Kyphotic positioning with rotation to the RIGHT.

Minimal bibasilar atelectasis.

No gross infiltrate, pleural effusion, or pneumothorax.

Diffuse osseous demineralization.
IMPRESSION: Minimal bibasilar atelectasis.

## 2016-05-17 DIAGNOSIS — Z112 Encounter for screening for other bacterial diseases: Secondary | ICD-10-CM | POA: Diagnosis not present

## 2016-05-20 ENCOUNTER — Ambulatory Visit: Payer: PPO | Admitting: Neurology

## 2016-06-08 ENCOUNTER — Ambulatory Visit: Payer: PPO | Admitting: Neurology

## 2016-09-06 ENCOUNTER — Other Ambulatory Visit: Payer: Self-pay | Admitting: Nurse Practitioner

## 2016-10-09 ENCOUNTER — Other Ambulatory Visit: Payer: Self-pay | Admitting: Neurology

## 2017-02-22 DEATH — deceased
